# Patient Record
Sex: Male | Born: 1940 | Race: White | Hispanic: No | Marital: Married | Smoking: Former smoker
Health system: Southern US, Community
[De-identification: ages and names within clinical notes are randomized; demographics above are authoritative.]

## PROBLEM LIST (undated history)

## (undated) DIAGNOSIS — I2510 Atherosclerotic heart disease of native coronary artery without angina pectoris: Secondary | ICD-10-CM

## (undated) DIAGNOSIS — I10 Essential (primary) hypertension: Secondary | ICD-10-CM

## (undated) DIAGNOSIS — R972 Elevated prostate specific antigen [PSA]: Secondary | ICD-10-CM

## (undated) DIAGNOSIS — C61 Malignant neoplasm of prostate: Secondary | ICD-10-CM

## (undated) DIAGNOSIS — Z951 Presence of aortocoronary bypass graft: Secondary | ICD-10-CM

## (undated) DIAGNOSIS — E785 Hyperlipidemia, unspecified: Secondary | ICD-10-CM

## (undated) HISTORY — PX: OTHER SURGICAL HISTORY: SHX169

## (undated) HISTORY — PX: PROSTATE BIOPSY: SHX241

---
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
## 2008-02-16 ENCOUNTER — Ambulatory Visit: Payer: Self-pay | Admitting: Vascular Surgery

## 2008-04-25 ENCOUNTER — Encounter (INDEPENDENT_AMBULATORY_CARE_PROVIDER_SITE_OTHER): Payer: Self-pay | Admitting: Emergency Medicine

## 2008-04-25 ENCOUNTER — Inpatient Hospital Stay (HOSPITAL_COMMUNITY): Admission: EM | Admit: 2008-04-25 | Discharge: 2008-04-29 | Payer: Self-pay | Admitting: Emergency Medicine

## 2008-04-25 ENCOUNTER — Ambulatory Visit: Payer: Self-pay | Admitting: Vascular Surgery

## 2008-07-12 ENCOUNTER — Inpatient Hospital Stay (HOSPITAL_COMMUNITY): Admission: RE | Admit: 2008-07-12 | Discharge: 2008-07-12 | Payer: Self-pay | Admitting: Neurosurgery

## 2008-12-13 ENCOUNTER — Ambulatory Visit: Payer: Self-pay | Admitting: Vascular Surgery

## 2009-01-10 ENCOUNTER — Ambulatory Visit: Payer: Self-pay | Admitting: Vascular Surgery

## 2009-01-17 ENCOUNTER — Ambulatory Visit: Payer: Self-pay | Admitting: Vascular Surgery

## 2010-04-20 ENCOUNTER — Ambulatory Visit (INDEPENDENT_AMBULATORY_CARE_PROVIDER_SITE_OTHER): Payer: Federal, State, Local not specified - PPO

## 2010-04-20 DIAGNOSIS — I83893 Varicose veins of bilateral lower extremities with other complications: Secondary | ICD-10-CM

## 2010-06-13 LAB — CBC
HCT: 41.8 % (ref 39.0–52.0)
Hemoglobin: 14.4 g/dL (ref 13.0–17.0)
MCV: 93.1 fL (ref 78.0–100.0)
RBC: 4.49 MIL/uL (ref 4.22–5.81)
WBC: 6.7 10*3/uL (ref 4.0–10.5)

## 2010-06-13 LAB — BASIC METABOLIC PANEL
Chloride: 106 mEq/L (ref 96–112)
GFR calc Af Amer: >60 mL/min (ref >60)
Potassium: 5.1 mEq/L (ref 3.5–5.1)
Sodium: 143 mEq/L (ref 135–145)

## 2010-06-20 LAB — CBC
HCT: 44.2 % (ref 39.0–52.0)
Hemoglobin: 14.9 g/dL (ref 13.0–17.0)
Hemoglobin: 15.6 g/dL (ref 13.0–17.0)
MCHC: 35.3 g/dL (ref 30.0–36.0)
MCV: 93.1 fL (ref 78.0–100.0)
RBC: 4.53 MIL/uL (ref 4.22–5.81)
RDW: 12.2 % (ref 11.5–15.5)

## 2010-06-20 LAB — DIFFERENTIAL
Basophils Absolute: 0 10*3/uL (ref 0.0–0.1)
Basophils Relative: 0 % (ref 0–1)
Eosinophils Absolute: 0 10*3/uL (ref 0.0–0.7)
Eosinophils Relative: 1 % (ref 0–5)
Lymphs Abs: 0.6 10*3/uL — ABNORMAL LOW (ref 0.7–4.0)
Monocytes Absolute: 0.7 10*3/uL (ref 0.1–1.0)
Monocytes Relative: 11 % (ref 3–12)
Neutro Abs: 4.9 10*3/uL (ref 1.7–7.7)
Neutrophils Relative %: 89 % — ABNORMAL HIGH (ref 43–77)

## 2010-06-20 LAB — URINALYSIS, ROUTINE W REFLEX MICROSCOPIC
Bilirubin Urine: NEGATIVE
Glucose, UA: NEGATIVE mg/dL
Hgb urine dipstick: NEGATIVE
Ketones, ur: >80 mg/dL — AB
pH: 6.5 (ref 5.0–8.0)

## 2010-06-20 LAB — COMPREHENSIVE METABOLIC PANEL
ALT: 32 U/L (ref 0–53)
Alkaline Phosphatase: 72 U/L (ref 39–117)
CO2: 26 mEq/L (ref 19–32)
Calcium: 9.2 mg/dL (ref 8.4–10.5)
GFR calc non Af Amer: >60 mL/min (ref >60)
Glucose, Bld: 124 mg/dL — ABNORMAL HIGH (ref 70–99)
Sodium: 140 mEq/L (ref 135–145)

## 2010-06-20 LAB — ANA: Anti Nuclear Antibody(ANA): NEGATIVE

## 2010-06-20 LAB — PROTIME-INR: Prothrombin Time: 13.6 seconds (ref 11.6–15.2)

## 2010-06-20 LAB — ABO/RH: ABO/RH(D): A POS

## 2010-06-20 LAB — BASIC METABOLIC PANEL
BUN: 12 mg/dL (ref 6–23)
CO2: 30 mEq/L (ref 19–32)
Calcium: 9.2 mg/dL (ref 8.4–10.5)
Creatinine, Ser: 1.05 mg/dL (ref 0.4–1.5)
Glucose, Bld: 166 mg/dL — ABNORMAL HIGH (ref 70–99)

## 2010-06-20 LAB — APTT: aPTT: 34 seconds (ref 24–37)

## 2010-06-20 LAB — SEDIMENTATION RATE: Sed Rate: 5 mm/hr (ref 0–16)

## 2010-07-18 NOTE — Op Note (Signed)
NAME:  Zachary [name], Zachary [name]                  ACCOUNT NO.:  [id number]   MEDICAL RECORD NO.:  [id number]          PATIENT TYPE:  OBV   LOCATION:  5010                         FACILITY:  [facility]   PHYSICIAN:  Donalee Citrin, M.D.        DATE OF BIRTH:  05/14/1940   DATE OF PROCEDURE:  DATE OF DISCHARGE:                               OPERATIVE REPORT   PREOPERATIVE DIAGNOSIS:  Left L4 radiculopathy from ruptured disk  extraforaminally at L4-5, left.   PROCEDURE:  Extraforaminal approach to L4-5 diskectomy on the left side  with microscopic dissection of the left L4 nerve root and microscopic  diskectomy.   SURGEON:  Donalee Citrin, MD   ASSISTANT:  Dr. Jordan Likes.   ANESTHESIA:  General endotracheal.   HISTORY:  The patient is a very pleasant 70 year old gentleman who was  admitted to the hospital from the emergency room about 3 days ago in  acute pain with a left L4 radiculopathy.  The patient had weakness on  his quadriceps and a decreased knee jerk reflex on exam.  MRI scan  showed a disk herniation at the extraforaminal space at L4-L5 on the  left.  The patient failed all forms of treatment due to his weakness and  MRI findings patient is recommended extraforaminal diskectomy. The risks  and benefits of the operation were explained to the patient.  He  understood and agreed to proceed forth.   DESCRIPTION OF PROCEDURE:  The patient was brought to the OR, was  induced general anesthesia, positioned supine.  Back prepped and draped  in usual sterile fashion.  Preop x-ray localized at the appropriate  level, and after infiltration of 10 mL of lidocaine with epi, a midline  incision was made, and Bovie electrocautery was used to take down the  subperiosteum.  Dissection was carried out on the lamina of L4 and L5.  L4 TP was then identified intraoperatively with x-ray.  Then the  inferior aspect of the facet complex, lateral pars, and the superior  aspect of the inferior facet complex was drilled down.   Then a 4  Penfield was used to develop the plane underneath the lateral pars  overlying the intertransverse ligament.  This was bitten away with 2-mm  Kerrison punch.  The intertransverse ligament was removed in a piecemeal  fashion and the L4 nerve root was immediately identified, work extending  underneath the L4 nerve root and to the level of disk space, this was  further dissected free and identified.  The disk space was bulging but  no frank herniation was visualized.  Epidural veins were coagulated.  Disk space was incised and cleaned out with pituitary rongeurs.  The  foramen appeared to be patent on examination above the nerve root.  However, a very large fragment disk was pinned superior to the nerve  root against the pedicle.  This was teased away with a nerve hook and  removed with pituitary rongeurs.  Then the nerve root relaxed to resume  its normal anatomic location and was explored with a coronary dilator  and angled hockey stick  and noted to  have no further stenosis.  At this  point, a meticulous hemostasis was maintained.  Gelfoam was laid on top  of the nerve root and the  wound was closed in layers with interrupted Vicryl and skin was closed  with running 4-0 subcuticular.  Benzoin and Steri-Strips were applied.  The patient went to the recovery room in stable condition.  At the end  of the case, sponge and instrument count were correct.           ______________________________  Donalee Citrin, M.D.     GC/MEDQ  D:  04/28/2008  T:  04/29/2008  Job:  [id number]

## 2010-07-18 NOTE — Op Note (Signed)
NAME:  Zachary [name], Zachary [name]                  ACCOUNT NO.:  [id number]   MEDICAL RECORD NO.:  [id number]          PATIENT TYPE:  INP   LOCATION:  3534                         FACILITY:  [facility]   PHYSICIAN:  Donalee Citrin, M.D.        DATE OF BIRTH:  [birth year]   DATE OF PROCEDURE:  07/12/2008  DATE OF DISCHARGE:  07/12/2008                               OPERATIVE REPORT   PREOPERATIVE DIAGNOSES:  1. Cervical spondylosis with radiculopathy, C5-C6.  2. Spondylosis with stenosis and foraminal stenosis in C4-5 and C5-6.   PROCEDURE:  Anterior cervical diskectomies and fusion in C4-5, C5-6  using a 7-mm allograft wedge at C4-5 and 6 mm at C5-6 and a 40-mm  venture plate with 161 MHz screw.   SURGEON:  Donalee Citrin, MD   ASSISTANT:  Kathaleen Maser. Pool, MD   ANESTHESIA:  General endotracheal.   HISTORY OF PRESENT ILLNESS:  The patient is a very pleasant 70 year old  gentleman who has had several months of progressed worsening neck and  bilateral arm pain radiating into his right shoulder and down his left  arm to his thumb and forefinger with numbness and tingling in the same  distribution.  The patient had trace weakness in his triceps on the left  side and had severe pain that was refractory to all forms of  conservative  treatment, anti-inflammatories, steroids, and physical  therapy.  The patient's MRI scan showed marked spondylosis at C4-5 and  C5-6 causing severe foraminal stenosis predominantly on the right C5  nerve root and left C6 nerve root and due to the patient's failure to  conservative treatment, MRI findings, and clinical exam, the patient was  recommended anterior cervical diskectomy and fusion.  The risks and  benefits of the operation were explained to the patient.  He understood  and agreed to proceed forward.   The patient was brought to the OR, induced under general anesthesia,  positioned supine and the neck flexed in extension with 5 pounds of  halter traction.  The right side of  his neck was prepped and draped in  usual sterile fashion.  Preop x-ray localized the appropriate level.  A  curvilinear incision was made just off the midline to the anterior  border of the sternocleidomastoid.  The superficial layer of the  platysma was dissected out and divided longitudinally.  The avascular  plane between the sternocleidomastoid and strap muscles was developed  down to the prevertebral fascia.  Prevertebral fascia was then dissected  with Kittners.  Intraoperative x-ray confirmed localization at the C4-5  disk space, then anterior osteophytes were bitten off with a 2-mm  Kerrison punch at C4 and C5 respectively.  Disk spaces were drilled down  the posterior annulus osteophyte complexes.  At this point, the  operative microscope was draped, brought into the field and under  microscopic illumination first working at C4-5.  The interspace was  drilled down further posterior to identify the PLL.  This was done.  Both endplates were underbitten with a 1-mm Kerrison punch.  The PLL was  then removed in  piecemeal fashion exposing the thecal sac and the  central canal was decompressed by aggressive under biting both endplates  marching across first the left C5 pedicle and the left C5 nerve root was  decompressed and flushed with pedicle and then worked across the right.  On the right side, there was marked osteophyte displacing proximal at  right C5 nerve root, this was then aggressively underbitten  decompressing the right C5 nerve root.  At the end of the diskectomy,  there was no further stenosis on either C5 or neural foramen and central  canal was also widely decompressed.  This was packed with Gelfoam, and  attention was taken to C5-6, and then working at C5-6 in similar  fashion, aggressive underbiting of both C5 and C6 vertebral bodies was  carried out.  Decompressing the central canal, dura was removed in  piecemeal fashion, marching across lateral to both C6  pedicles, and both  C6 neural foramina were decompressed and flush with pedicle.  They again  assumed to be marked uncinate hypertrophy causing some foraminal  stenosis.  This was all unroofed and both C6 nerve roots were  decompressed easily accepting a nerve hook in the foramen.  Then, after  confirmation a central decompression with underbiting of both endplates,  the endplates were scraped.  A 6-mm allograft wedge inserted at C5-6 and  a 7-mm allograft wedge at C5, then a 40-mm Venture plate was placed.  All screws had excellent purchase.  Locking mechanism engaged.  Postop  fluoroscopy confirmed good position of plate, screws, and bone grafts.  Then after meticulous hemostasis was maintained, and was copiously  irrigated and closed in layers with interrupted Vicryl in the platysma  and a running 4-0 subcuticular.  Benzoin and Steri-Strips were applied.  The patient went to recovery room in stable condition.  At the end of  the case, sponge and instrument counts were correct.           ______________________________  Donalee Citrin, M.D.     GC/MEDQ  D:  07/12/2008  T:  07/13/2008  Job:  [id number]

## 2010-07-18 NOTE — Consult Note (Signed)
NAME:  Zachary [name], Zachary [name]                  ACCOUNT NO.:  [id number]   MEDICAL RECORD NO.:  [id number]          PATIENT TYPE:  OBV   LOCATION:  5010                         FACILITY:  [facility]   PHYSICIAN:  Donalee Citrin, M.D.        DATE OF BIRTH:  1940-11-09   DATE OF CONSULTATION:  04/27/2008  DATE OF DISCHARGE:                                 CONSULTATION   REASON FOR CONSULTATION:  Left leg pain.   CONSULTING PHYSICIAN:  Candyce Churn, MD   HISTORY OF PRESENT ILLNESS:  The patient is a very pleasant 70 year old  gentleman, who experienced acute onset of left-sided low back and left  leg pain started on Sunday.  Pain radiates through his buttock down the  posterior and lateral aspect of his left thigh and to his calf in front  of his shin.  This has been refractory to all forms of conservative  treatment with anti-inflammatories and medication at home.  The patient  presented to his medical doctor on 21st, and was admitted as the pain  was unable to get to.  He presented to the emergency room with 10/10  pain and was admitted from the emergency room to his medical doctor.  Since he has been in the hospital, he has been placed on Toradol and  anti-inflammatories in addition to IV Dilaudid and Ativan, and now he  has gotten the pain under control to where he can tolerated it, however,  without the medication, the pain is severe.  He also has noticed  weakness in the left leg that gives way whenever he ambulates.   PAST MEDICAL HISTORY:  Remarkable for hypertension and arthritis.   MEDICATIONS:  Lisinopril, Vicodin, aspirin, doxazosin, diclofenac, and  hydrochlorothiazide.   He has no medication allergies.  He denies any alcohol or tobacco use.   PHYSICAL EXAMINATION:  GENERAL:  The patient is a very pleasant, awake,  alert 70 year old gentleman, in no acute distress.  HEENT:  Within normal limits.  NECK:  Full range of motion with minimal discomfort.  NEUROLOGIC:  Strength is 5/5 in  his deltoids, biceps, triceps, wrist  flexion, and intrinsics.  Lower extremity strength 5/5 in his iliopsoas  bilaterally.  He has left-sided quadricep weakness at about 4/5 as well  as some hamstring weakness at 4+/5, dorsiflexion, plantar flexion, EHL  appear to be 5/5.  He has a decreased left knee jerk reflex compared to  the right and absent ankle jerks bilaterally.   His MRI scan does show a fairly sizeable foraminal disk herniation at L4-  5 on the left displacing the left L4 nerve root.  This clearly  correlates with his clinical syndrome.  Due to his failure of anti-  inflammatory treatment, the weakness he has in his left leg and  worsening pain and location of the MRI findings, I have recommended  extraforaminal diskectomy at L4-5 on the left.  I went over the risks  and benefits of operation with him, he wants to proceed forward.  We  tentatively schedule this for tomorrow, on Wednesday.  In addition,  the  patient did complain also of some numbness and tingling in his hands.  This is a completely different problem; however, he noticed back early  in November, he was started on Azor and he started developing cramping,  numbness, tingling, and stiffness in his hands.  In January, he was  switched to lisinopril, this has gotten some better, although he was  also started on hydrocodone and Toradol at the same time so that may  have contributed to some of the improvement.  He does have some  radiation going down the left arm, but denies any right arm radicular  symptoms.  His exam is unremarkable for upper extremity weakness or  reflex abnormalities, but he is already scheduled for cervical spine  MRI, and I will see what that shows and it is possible this could still  be a medication effect.  We will see what the MRI looks like, it  certainly could be radiculopathy or an early sign of some hand intrinsic  stiffness from spinal cord compression, although I think that is   unlikely, but it would be important to see those images before we put  him to see if it was his lumbar spine and we will follow that up and  treat that as an outpatient once we cure his acute L4 radiculopathy.           ______________________________  Donalee Citrin, M.D.     GC/MEDQ  D:  04/27/2008  T:  04/28/2008  Job:  [id number]

## 2010-07-18 NOTE — Consult Note (Signed)
VASCULAR SURGERY CONSULTATION   Hinde, Jahziel  DOB:  [birth year]                                       02/16/2008  CHART#:[id number]   The patient is a year 70 year old gentleman referred by Dr. Kevan Ny for  venous insufficiency left worse than right.  This gentleman has been  having increasing discomfort in the left thigh and calf, especially  after having been on his feet.  He develops a burning, itching,  throbbing discomfort which is partially relieved by elevation.  He has  not worn elastic compression stockings and does not take pain medicine  on a regular basis.  He has some mild swelling which develops as the day  progresses.  He has had no history of thrombophlebitis, deep venous  thrombosis, bleeding or stasis ulcers.  His primary symptoms involve the  left thigh and calf area with no symptomatology in the right leg at this  time.   PAST MEDICAL HISTORY:  1. Hypertension.  2. Negative for diabetes, coronary artery disease, COPD, stroke or      hyperlipidemia.   PREVIOUS SURGERY:  1. Nasal mass resection age 8.  2. Bone graft __________to repair his nose.  3. LASIK surgery.  4. Right inguinal hernia.  5. Also a history of sigmoid diverticulosis and occasional sciatica.   ALLERGIES:  None known.   MEDICATIONS:  Please see health history form.   FAMILY HISTORY:  Positive for coronary artery disease in his mother,  father and a brother.  Negative for diabetes and stroke.   SOCIAL HISTORY:  He is married, he has 2 children, does not use tobacco,  drinks occasional alcohol.   REVIEW OF SYSTEMS:  Unremarkable.  No chest pain, dyspnea on exertion,  PND, orthopnea, bronchitis, hemoptysis.  No GI or GU symptoms at this  time.   PHYSICAL EXAMINATION:  Blood pressure 138/70, heart rate 60,  respirations 14.  General:  This is a healthy-appearing male in no  apparent distress, alert and oriented x3.  Neck:  Supple, 3+ carotid  pulses palpable.  No bruits  are audible.  Neurologic:  Normal.  No  palpable adenopathy in the neck.  Chest:  Clear to auscultation.  Cardiovascular:  Regular rhythm, no murmurs.  Abdomen:  Soft, nontender  with no masses.  He has 3+ femoral, popliteal, dorsalis pedis and  posterior tibial pulses bilaterally.  The left leg has prominent  varicosities along the greater saphenous system beginning in the mid  thigh, extending into the distal thigh and into the medial calf.  There  is also some spider veins emanating from this.  Right leg has spider  veins in the anterior medial thigh area in 2 patterns.  He has no distal  edema on the right but does have 1+ edema on the left.  No  hyperpigmentation or ulceration is noted.   Venous duplex exam today reveals gross reflux at the left saphenofemoral  junction and throughout the left great saphenous vein to the knee.  The  deep system has only mild reflux on the left.  Right leg is  unremarkable.   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  This  is affecting his daily living and we will treat him initially with  elastic compression stockings (20  mm to 30 mm long-leg) as well as  elevation of his legs, as much as his work will permit, and ibuprofen on  a regular basis.  I will see him back in 3 months to check for any  improvement in his symptomatology.  If he is not improved, I think he  would be a good candidate for laser ablation of his left great saphenous  vein with multiple stab phlebectomies.   Quita Skye Hart [place], M.D.  Electronically Signed  JDL/MEDQ  D:  02/16/2008  T:  02/17/2008  Job:  [id number]   cc:   Candyce Churn, M.D.

## 2010-07-18 NOTE — Procedures (Signed)
LOWER EXTREMITY VENOUS REFLUX EXAM   INDICATION:  Varicose veins.   EXAM:  Using color-flow imaging and pulse Doppler spectral analysis, the  bilateral common femoral, superficial femoral, popliteal, posterior  tibial, greater and lesser saphenous veins are evaluated.  There is  evidence suggesting deep venous insufficiency in the bilateral common  femoral and left popliteal veins.   The right saphenofemoral junction is competent.  The left saphenofemoral  junction is not competent.  The right GSV is competent. The left GSV is  not competent with the caliber as described below.   The right proximal short saphenous vein was not visualized.  The left  proximal short saphenous vein demonstrates competency.   GSV Diameter (used if found to be incompetent only)                                            Right    Left  Proximal Greater Saphenous Vein           cm       0.58 cm  Proximal-to-mid-thigh                     cm       0.53 cm  Mid thigh                                 cm       0.58 cm  Mid-distal thigh                          cm       0.49 cm  Distal thigh                              cm       0.49 cm  Knee                                      cm       0.41 cm   IMPRESSION:  1. Left greater saphenous vein reflux is identified with the caliber      measurements as described above.  2. The bilateral greater saphenous veins are not aneurysmal or      tortuous.  3. The deep venous system demonstrates incompetency, as described      above.   ___________________________________________  Quita Skye. Hart [place], M.D.   CH/MEDQ  D:  02/16/2008  T:  02/17/2008  Job:  [id number]

## 2010-07-18 NOTE — H&P (Signed)
NAME:  AKING, KLABUNDE                  ACCOUNT NO.:  [id number]   MEDICAL RECORD NO.:  [id number]          PATIENT TYPE:  EMS   LOCATION:  MAJO                         FACILITY:  [facility]   PHYSICIAN:  Hollice Espy, M.D.DATE OF BIRTH:  10-10-1940   DATE OF ADMISSION:  04/25/2008  DATE OF DISCHARGE:                              HISTORY & PHYSICAL   PRIMARY CARE Daylan Juhnke:  Dr. Johnella Moloney.   CHIEF COMPLAINT:  Leg pain.   HISTORY OF PRESENT ILLNESS:  The patient is a 70 year old white male  with a past medical history of hypertension who had been on an ACE  inhibitor and had been having some problems with hand pain.  He has been  alternating his care with Dr. Kevan Ny as well as with the [facility] and  his lisinopril had been changed over to Azor, which is a combination of  Norvasc and Benicar.  He started noticing problems with hand swelling  and pain in his hands and arms, and switched himself back to lisinopril,  and he said that helped his symptoms.  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  His MRI of  the C-spine was not yet done.  He woke up this early morning hours in  severe leg pain, mostly anterior but some posterior radiating all way  down past his knee.  Pain is described as severe 10/10 when he presented  to the emergency room.  X-rays of the left hip showed no acute  abnormality and the left knee noted minimal atheromatous arterial  calcifications but no acute abnormality.  Because of the pain that was  difficult to control with p.o. medications and even required some IV  pain medication, which started making the pain somewhat tolerable, it  was felt best that he come in for further evaluation and treatment.  Did  get CBC, BMET, and CPK level. White count came back elevated at 10.  However, with a noted 80% shift.  The rest of his CBC was  unremarkable.  His CMET and CK are currently pending.  Currently, the patient is doing  well.  He denies any headaches, vision changes, dysphagia, chest pain,  palpitations, shortness breath, wheeze, cough, abdominal pain,  hematuria, dysuria, vision, diarrhea focal extremity numbness weakness  or pain other than described above.   REVIEW OF SYSTEMS:  Otherwise negative.   PAST MEDICAL HISTORY:  Includes hypertension and some arthritis.   MEDICATIONS:  1. He is on Azor, which he has quit taking and restarted back on      lisinopril 40.  2. He takes p.r.n. Vicodin.  3. Aspirin 81.  4. Doxazosin 1 mg p.o. daily.  5. Diclofenac p.o. b.i.d.  6. Hydrochlorothiazide 25 p.o. daily.   ALLERGIES:  He has no known drug allergies.   SOCIAL HISTORY:  Denies any tobacco, alcohol or drug use.   FAMILY HISTORY:  Noncontributory.   PHYSICAL EXAMINATION:  VITALS ON ADMISSION:  Temperature  96.4, heart  rate 65, blood pressure 151/89 down to 146/80, respirations 18, O2  saturation 100% on room air.  GENERAL:  He is alert and oriented x3 in no apparent distress.  HEENT:  Normocephalic atraumatic.  Mucous membranes are moist.  He has  no carotid bruits.  HEART:  Regular rate and rhythm.  S1, S2.  LUNGS:  Clear to auscultation bilaterally.  ABDOMEN:  Soft, nontender, nondistended.  Positive bowel sounds.  EXTREMITIES:  No clubbing, cyanosis or edema.  Musculoskeletal exam for  that leg includes no increased pain with deep palpation of his anterior  thigh and able to flex and extend and seemed to have some improvement  when he contracts his hip and flexes his knee, but it does not resolve  the pain at all.  He has some straight leg raising and obturator's are  negative for pain.  He has some positive increased pain, he says, with  Homan flexion, but otherwise unremarkable.   LAB WORK:  White count 10, H and H 15.6 and 44, MCV 93, platelet count  300,000, 89% shift.  Rest of his labs are pending.   X-rays are as per  HPI.   ASSESSMENT AND PLAN:  1. Leg pain.  Question if this is rhabdomyolysis.  Will await CPK      level.  In the meantime will give pain control as well as some      anxiety control.  Will also check an MRI of the lumbar spine.  If      this is negative, will check a CT angio of the legs and look for      possible vascular insufficiency.  2. Hypertension.  Continue medications.      Hollice Espy, M.D.  Electronically Signed     SKK/MEDQ  D:  04/25/2008  T:  04/25/2008  Job:  [id number]   cc:   Candyce Churn, M.D.

## 2010-07-18 NOTE — Assessment & Plan Note (Signed)
OFFICE VISIT   Duren, Wasim L  DOB:  1941-02-11                                       01/17/2009  CHART#:[id number]   The patient returns 1 week post laser ablation of left great saphenous  vein with multiple stab phlebectomies for painful varicosities and  swelling.  He has done well with some mild discomfort along the course  of the great saphenous vein in the thigh.  He only took small doses of  ibuprofen rather than the larger doses, but states that the pain has not  been that severe.  He has had no pain at the stab phlebectomy wounds,  has had no distal edema and has been wearing his elastic compression  stocking as requested.  On exam today he has no distal edema.  Incisions  are all healing nicely.  There is mild tenderness along the course of  the great saphenous vein in the left thigh.  Venous duplex exam reveals  no evidence of deep venous obstruction and the left great saphenous vein  appears to be totally occluded with no reflux at the junction.   I reassured him regarding these findings.  He will wear the stocking for  one more week and return to see [place] on a p.r.n. basis.   Quita Skye Hart [place], M.D.  Electronically Signed   JDL/MEDQ  D:  01/17/2009  T:  01/18/2009  Job:  [id number]   cc:   Candyce Churn, M.D.

## 2010-07-18 NOTE — Assessment & Plan Note (Signed)
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  This 70 year old gentleman has continued  to have increasing discomfort in the thigh and calf after having been on  his feet and also while sitting.  He has been wearing long-leg graduated  compression stockings (20 mm - 30 mm) over the past 9 months and  elevating the leg as much as possible as well as trying ibuprofen on a  regular basis.  He has had a cervical spine and lumbar spine surgery  since we last saw him in December 2009.  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  On examination he  continues to have bulging varicosities in the distal left thigh over the  great saphenous vein as well as in the medial left calf over the left  great saphenous vein with some mild distal edema.   I think the best plan to treat these symptoms would be laser ablation of  the left great saphenous vein with multiple stab phlebectomies.  These  are affecting his daily living and we will plan to get this precertified  to proceed in the near future for this nice man.   Quita Skye Hart [place], M.D.  Electronically Signed   JDL/MEDQ  D:  12/13/2008  T:  12/14/2008  Job:  [id number]

## 2010-07-18 NOTE — Procedures (Signed)
DUPLEX DEEP VENOUS EXAM - LOWER EXTREMITY   INDICATION:  Follow up laser ablation.   HISTORY:  Edema:  No.  Trauma/Surgery:  Left great saphenous vein laser ablation on 01/10/09.  Pain:  Left thigh tenderness.  PE:  No.  Previous DVT:  No.  Anticoagulants:  Other:   DUPLEX EXAM:                CFV   SFV   PopV  PTV    GSV                R  L  R  L  R  L  R   L  R  L  Thrombosis    o  o     o     o      o     +  Spontaneous   +  +     +     +      +     0  Phasic        +  +     +     +      +     0  Augmentation  +  +     +     +      +     0  Compressible  +  +     +     +      +     0  Competent     +  +     +     +      +     +   Legend:  + - yes  o - no  p - partial  D - decreased   IMPRESSION:  1. No evidence of left lower extremity deep venous thrombosis noted.  2. 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  3. No reflux as noted in the left saphenofemoral junction.      _____________________________  Quita Skye Hart [place], M.D.   CH/MEDQ  D:  01/18/2009  T:  01/18/2009  Job:  [id number]

## 2010-07-21 NOTE — Discharge Summary (Signed)
NAME:  Zachary [name], Zachary [name]                  ACCOUNT NO.:  [id number]   MEDICAL RECORD NO.:  [id number]          PATIENT TYPE:  INP   LOCATION:  5010                         FACILITY:  [facility]   PHYSICIAN:  Candyce Churn, M.D.DATE OF BIRTH:  13-Aug-1940   DATE OF ADMISSION:  04/25/2008  DATE OF DISCHARGE:  04/29/2008                               DISCHARGE SUMMARY   DISCHARGE DIAGNOSES:  1. 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  2. Hypertension.  3. History of sigmoid diverticulosis.  4. Constipation secondary to narcotic analgesics.   PROCEDURE:  Extraforaminal approach to L4-5 diskectomy on the left with  microscopic dissection of the left L4 nerve root and microscopic  diskectomy - Dr. Donalee Citrin.   DISCHARGE MEDICATIONS:  1. Lisinopril 40 mg orally daily.  2. Amlodipine 5 mg daily.  3. Hydrochlorothiazide 25 mg daily.  4. MiraLax powder 17 g in 8 ounces of water daily as needed for      constipation.  5. Vicodin 5/500, 1-2 orally q.4-6 h. p.r.n. pain.   HOSPITAL COURSE:  Zachary [name] is a pleasant 70 year old male with a  past history of hypertension.  He has a history of both cervical and  lumbar radiculopathy.  He woke up early on the morning of April 25, 2008, was having severe leg pain on the left radiating down past his  left knee.  The pain was described as very severe, 10/10, when he  presented to the emergency room, and x-rays of the left hip showed no  acute abnormality and only minimal degenerative changes of the left  knee.  He was started on intravenous pain medication and was admitted  for further pain control and workup.   MRI of the lumbar spine performed on April 26, 2008 revealed a  foraminal disk protrusion on the left, and there was a prominent  leftward epidural vein behind L4 versus a small epidural hematoma.  Left  L4 nerve root encroachment was noted.   The patient was seen in consultation by Dr. Donalee Citrin who proceeded with  surgery as above on April 28, 2008 with excellent relief of pain over  the next 24 hours.  The patient was able to be discharged home in much  improved condition.   Blood pressure, though elevated on admission, was controlled by  discharge.   DISCHARGE LABORATORY DATA:  From April 27, 2008, CBC revealed a white  count 6700, hemoglobin was 14.9, platelet count was 295,000.  PTT was 34  seconds.  Protime was 13.6 seconds.  Sodium 138, potassium 4, chloride  102, bicarb 30, glucose mildly elevated at 166, and BUN 12 and  creatinine 1.05.  ANA from April 26, 2008 was negative and sed rate  was only 5 mm per hour and rheumatoid factor was less than 20, normal.  TSH on April 26, 2008 was normal at 0.875.  urinalysis on admission  was within normal limits except for greater than 80 mg of ketones per  deciliter secondary to poor p.o. intake on admission.  Liver function  tests on admission were within normal limits except for a slightly AST  of 39.  Creatinine kinase on admission was 230, normal.   CONDITION ON DISCHARGE:  Much improved.  The patient will follow up in  my office in 1 week and with Dr. Wynetta Emery in 1-2 weeks.   The patient did have an MRI of the orbits because of prior history of  metal exposure to the orbits, and there were no metallic foreign bodies  within the orbits.  X-ray of the knee performed on April 25, 2008  revealed minimal arterial calcifications with normal-appearing bones and  soft tissues.  Left hip x-ray on April 25, 2008 revealed left pelvic  phleboliths; otherwise within normal limits.      Candyce Churn, M.D.  Electronically Signed     RNG/MEDQ  D:  05/16/2008  T:  05/17/2008  Job:  [id number]   cc:   Donalee Citrin, M.D.

## 2014-06-21 ENCOUNTER — Other Ambulatory Visit: Payer: Self-pay | Admitting: Orthopaedic Surgery

## 2014-06-23 NOTE — H&P (Signed)
Zachary [name] is an 74 y.o. male.    Chief Complaint: Left knee pain  HPI: Zachary [name] is a 74 year old man retired from the [employer]. He is here again about his left knee. He's undergone an MRI scan since last time he was here. He has intense pain on the inside aspect. He has trouble resting at night especially. He has trouble sitting comfortably and twisting is painful. This pain is intermittent and severe and is getting worse. He did receive an injection a few weeks back which helped transiently.   MRI:  I reviewed an MRI scan films and report of a study done at the [facility] on 06/15/14. This shows an oblique tear of the medial meniscus.  No past medical history on file.  No past surgical history on file.  No family history on file. Social History:  has no tobacco, alcohol, and drug history on file.  Allergies: Allergies not on file  No prescriptions prior to admission    No results found for this or any previous visit (from the past 48 hour(s)). No results found.  Review of Systems  Musculoskeletal: Positive for joint pain.       Left knee pain  All other systems reviewed and are negative.   There were no vitals taken for this visit. Physical Exam  Constitutional: He is oriented to person, place, and time. He appears well-developed and well-nourished.  HENT:  Head: Normocephalic and atraumatic.  Eyes: Pupils are equal, round, and reactive to light.  Neck: Normal range of motion.  Cardiovascular: Normal rate and regular rhythm.   Respiratory: Effort normal.  GI: Soft.  Musculoskeletal:  Left knee has no effusion. His pain is posteromedial and McMurray's test which is positive for pain. He has good ligamentous integrity.  Hip motion is full and pain free and SLR is negative on both sides.  There is no palpable LAD behind either knee.  Sensation and motor function are intact on both sides and there are palpable pulses on both sides.  Neurological: He is alert and oriented to  person, place, and time.  Skin: Skin is warm and dry.  Psychiatric: He has a normal mood and affect. His behavior is normal. Judgment and thought content normal.     Assessment/Plan Assessment: Left knee torn medial meniscus by MRI  Plan: At this point I think Zachary [name] needs to consider a knee arthroscopy. He has terrible pain despite some time and an injection. I reviewed risks of anesthesia and infection as well as potential for DVT related to a knee arthroscopy.  I've stressed the importance of some postoperative physical therapy to optimize results and we will try to set up an appointment.  Two to four weeks for recovery would be typical but that is a little variable.    Zachary [name], Zachary [name] 06/23/2014, 12:28 PM

## 2014-06-24 ENCOUNTER — Encounter (HOSPITAL_BASED_OUTPATIENT_CLINIC_OR_DEPARTMENT_OTHER): Payer: Self-pay | Admitting: *Deleted

## 2014-06-24 ENCOUNTER — Encounter (HOSPITAL_BASED_OUTPATIENT_CLINIC_OR_DEPARTMENT_OTHER)
Admission: RE | Admit: 2014-06-24 | Discharge: 2014-06-24 | Disposition: A | Discharge status: Home / Self Care | Payer: PPO | Source: Ambulatory Visit | Attending: Orthopaedic Surgery | Admitting: Orthopaedic Surgery

## 2014-06-24 ENCOUNTER — Other Ambulatory Visit: Payer: Self-pay

## 2014-06-24 DIAGNOSIS — S83242A Other tear of medial meniscus, current injury, left knee, initial encounter: Secondary | ICD-10-CM | POA: Diagnosis not present

## 2014-06-24 DIAGNOSIS — M23204 Derangement of unspecified medial meniscus due to old tear or injury, left knee: Secondary | ICD-10-CM | POA: Diagnosis not present

## 2014-06-24 DIAGNOSIS — Y929 Unspecified place or not applicable: Secondary | ICD-10-CM | POA: Diagnosis not present

## 2014-06-24 DIAGNOSIS — Z01818 Encounter for other preprocedural examination: Secondary | ICD-10-CM | POA: Insufficient documentation

## 2014-06-24 DIAGNOSIS — X58XXXA Exposure to other specified factors, initial encounter: Secondary | ICD-10-CM | POA: Diagnosis not present

## 2014-06-24 LAB — BASIC METABOLIC PANEL
ANION GAP: 7 (ref 5–15)
BUN: 17 mg/dL (ref 6–23)
CALCIUM: 9.2 mg/dL (ref 8.4–10.5)
CHLORIDE: 103 mmol/L (ref 96–112)
CO2: 29 mmol/L (ref 19–32)
Creatinine, Ser: 0.91 mg/dL (ref 0.50–1.35)
GFR calc Af Amer: >90 mL/min (ref >90)
GFR calc non Af Amer: 81 mL/min — ABNORMAL LOW (ref >90)
Glucose, Bld: 119 mg/dL — ABNORMAL HIGH (ref 70–99)
Potassium: 3.9 mmol/L (ref 3.5–5.1)
SODIUM: 139 mmol/L (ref 135–145)

## 2014-06-24 NOTE — Progress Notes (Signed)
Coming today for BMET and EKG. Bring all medications day of surgery.

## 2014-06-25 ENCOUNTER — Encounter (HOSPITAL_BASED_OUTPATIENT_CLINIC_OR_DEPARTMENT_OTHER): Payer: Self-pay | Admitting: Anesthesiology

## 2014-06-25 ENCOUNTER — Ambulatory Visit (HOSPITAL_BASED_OUTPATIENT_CLINIC_OR_DEPARTMENT_OTHER): Payer: PPO | Admitting: Anesthesiology

## 2014-06-25 ENCOUNTER — Ambulatory Visit (HOSPITAL_BASED_OUTPATIENT_CLINIC_OR_DEPARTMENT_OTHER)
Admission: RE | Admit: 2014-06-25 | Discharge: 2014-06-25 | Disposition: A | Discharge status: Home / Self Care | Payer: PPO | Source: Ambulatory Visit | Attending: Orthopaedic Surgery | Admitting: Orthopaedic Surgery

## 2014-06-25 ENCOUNTER — Encounter (HOSPITAL_BASED_OUTPATIENT_CLINIC_OR_DEPARTMENT_OTHER)
Admission: RE | Disposition: A | Discharge status: Home / Self Care | Payer: Self-pay | Source: Ambulatory Visit | Attending: Orthopaedic Surgery

## 2014-06-25 DIAGNOSIS — X58XXXA Exposure to other specified factors, initial encounter: Secondary | ICD-10-CM | POA: Insufficient documentation

## 2014-06-25 DIAGNOSIS — Y929 Unspecified place or not applicable: Secondary | ICD-10-CM | POA: Insufficient documentation

## 2014-06-25 DIAGNOSIS — S83242A Other tear of medial meniscus, current injury, left knee, initial encounter: Secondary | ICD-10-CM | POA: Diagnosis not present

## 2014-06-25 HISTORY — PX: KNEE ARTHROSCOPY WITH MEDIAL MENISECTOMY: SHX5651

## 2014-06-25 HISTORY — DX: Essential (primary) hypertension: I10

## 2014-06-25 HISTORY — PX: CHONDROPLASTY: SHX5177

## 2014-06-25 LAB — POCT HEMOGLOBIN-HEMACUE: HEMOGLOBIN: 15.9 g/dL (ref 13.0–17.0)

## 2014-06-25 SURGERY — ARTHROSCOPY, KNEE, WITH MEDIAL MENISCECTOMY
Anesthesia: General | Site: Knee | Laterality: Left

## 2014-06-25 MED ORDER — MIDAZOLAM HCL 2 MG/2ML IJ SOLN: 1.0000 mg | INTRAMUSCULAR | Status: DC | PRN

## 2014-06-25 MED ORDER — CEFAZOLIN SODIUM-DEXTROSE 2-3 GM-% IV SOLR
INTRAVENOUS | Status: DC | PRN
  Administered 2014-06-25: 2 g via INTRAVENOUS

## 2014-06-25 MED ORDER — IBUPROFEN 200 MG PO TABS: 200.0000 mg | ORAL_TABLET | Freq: Four times a day (QID) | ORAL | Status: DC | PRN

## 2014-06-25 MED ORDER — OXYCODONE HCL 5 MG/5ML PO SOLN: 5.0000 mg | Freq: Once | ORAL | Status: AC | PRN

## 2014-06-25 MED ORDER — SODIUM CHLORIDE 0.9 % IR SOLN
Status: DC | PRN
Start: 2014-06-25 — End: 2014-06-25
  Administered 2014-06-25: 6000 mL

## 2014-06-25 MED ORDER — GLYCOPYRROLATE 0.2 MG/ML IJ SOLN: 0.2000 mg | Freq: Once | INTRAMUSCULAR | Status: DC | PRN

## 2014-06-25 MED ORDER — PROPOFOL 10 MG/ML IV BOLUS
INTRAVENOUS | Status: DC | PRN
  Administered 2014-06-25: 200 mg via INTRAVENOUS

## 2014-06-25 MED ORDER — FENTANYL CITRATE (PF) 100 MCG/2ML IJ SOLN
INTRAMUSCULAR | Status: DC | PRN
  Administered 2014-06-25: 50 ug via INTRAVENOUS
  Administered 2014-06-25: 25 ug via INTRAVENOUS
  Administered 2014-06-25: 50 ug via INTRAVENOUS

## 2014-06-25 MED ORDER — HYDROMORPHONE HCL 1 MG/ML IJ SOLN: 0.2500 mg | INTRAMUSCULAR | Status: DC | PRN

## 2014-06-25 MED ORDER — LIDOCAINE HCL (CARDIAC) 20 MG/ML IV SOLN
INTRAVENOUS | Status: DC | PRN
  Administered 2014-06-25: 50 mg via INTRAVENOUS

## 2014-06-25 MED ORDER — BUPIVACAINE-EPINEPHRINE 0.5% -1:200000 IJ SOLN
INTRAMUSCULAR | Status: DC | PRN
  Administered 2014-06-25: 20 mL

## 2014-06-25 MED ORDER — FENTANYL CITRATE (PF) 100 MCG/2ML IJ SOLN
INTRAMUSCULAR | Status: AC
  Filled 2014-06-25: qty 6

## 2014-06-25 MED ORDER — ONDANSETRON HCL 4 MG/2ML IJ SOLN
INTRAMUSCULAR | Status: DC | PRN
  Administered 2014-06-25: 4 mg via INTRAVENOUS

## 2014-06-25 MED ORDER — LACTATED RINGERS IV SOLN
INTRAVENOUS | Status: DC
  Administered 2014-06-25 (×2): via INTRAVENOUS

## 2014-06-25 MED ORDER — OXYCODONE HCL 5 MG PO TABS
ORAL_TABLET | ORAL | Status: AC
Start: 2014-06-25 — End: 2014-06-25
  Filled 2014-06-25: qty 1

## 2014-06-25 MED ORDER — CHLORHEXIDINE GLUCONATE 4 % EX LIQD: 60.0000 mL | Freq: Once | CUTANEOUS | Status: DC

## 2014-06-25 MED ORDER — MEPERIDINE HCL 25 MG/ML IJ SOLN: 6.2500 mg | INTRAMUSCULAR | Status: DC | PRN

## 2014-06-25 MED ORDER — LACTATED RINGERS IV SOLN: INTRAVENOUS | Status: DC

## 2014-06-25 MED ORDER — MORPHINE SULFATE 4 MG/ML IJ SOLN
INTRAMUSCULAR | Status: DC | PRN
  Administered 2014-06-25: 4 mg via INTRAVENOUS

## 2014-06-25 MED ORDER — MIDAZOLAM HCL 5 MG/5ML IJ SOLN: INTRAMUSCULAR | Status: DC | PRN

## 2014-06-25 MED ORDER — OXYCODONE HCL 5 MG PO TABS
5.0000 mg | ORAL_TABLET | Freq: Once | ORAL | Status: AC | PRN
Start: 2014-06-25 — End: 2014-06-25
  Administered 2014-06-25: 5 mg via ORAL

## 2014-06-25 MED ORDER — FENTANYL CITRATE (PF) 100 MCG/2ML IJ SOLN: 50.0000 ug | INTRAMUSCULAR | Status: DC | PRN

## 2014-06-25 MED ORDER — DEXAMETHASONE SODIUM PHOSPHATE 4 MG/ML IJ SOLN
INTRAMUSCULAR | Status: DC | PRN
  Administered 2014-06-25: 10 mg via INTRAVENOUS

## 2014-06-25 MED ORDER — IBUPROFEN 100 MG/5ML PO SUSP: 200.0000 mg | Freq: Four times a day (QID) | ORAL | Status: DC | PRN

## 2014-06-25 MED ORDER — CEFAZOLIN SODIUM-DEXTROSE 2-3 GM-% IV SOLR: 2.0000 g | INTRAVENOUS | Status: DC

## 2014-06-25 MED ORDER — HYDROCODONE-ACETAMINOPHEN 5-325 MG PO TABS: 1.0000 | ORAL_TABLET | Freq: Four times a day (QID) | ORAL | Status: DC | PRN

## 2014-06-25 MED ORDER — KETOROLAC TROMETHAMINE 30 MG/ML IJ SOLN: 30.0000 mg | Freq: Once | INTRAMUSCULAR | Status: DC | PRN

## 2014-06-25 MED ORDER — CEFAZOLIN SODIUM-DEXTROSE 2-3 GM-% IV SOLR
INTRAVENOUS | Status: AC
  Filled 2014-06-25: qty 50

## 2014-06-25 MED ORDER — MORPHINE SULFATE 4 MG/ML IJ SOLN
INTRAMUSCULAR | Status: AC
  Filled 2014-06-25: qty 1

## 2014-06-25 SURGICAL SUPPLY — 38 items
BANDAGE ELASTIC 6 VELCRO ST LF (GAUZE/BANDAGES/DRESSINGS) ×4 IMPLANT
BLADE CUDA 5.5 (BLADE) IMPLANT
BLADE CUTTER GATOR 3.5 (BLADE) ×4 IMPLANT
BLADE GREAT WHITE 4.2 (BLADE) IMPLANT
BNDG GAUZE ELAST 4 BULKY (GAUZE/BANDAGES/DRESSINGS) ×4 IMPLANT
DRAPE ARTHROSCOPY W/POUCH 114 (DRAPES) ×4 IMPLANT
DRAPE U-SHAPE 47X51 STRL (DRAPES) ×4 IMPLANT
DRSG EMULSION OIL 3X3 NADH (GAUZE/BANDAGES/DRESSINGS) ×4 IMPLANT
DURAPREP 26ML APPLICATOR (WOUND CARE) ×4 IMPLANT
ELECT MENISCUS 165MM 90D (ELECTRODE) IMPLANT
ELECT REM PT RETURN 9FT ADLT (ELECTROSURGICAL)
ELECTRODE REM PT RTRN 9FT ADLT (ELECTROSURGICAL) IMPLANT
GAUZE SPONGE 4X4 12PLY STRL (GAUZE/BANDAGES/DRESSINGS) ×4 IMPLANT
GLOVE BIO SURGEON STRL SZ8 (GLOVE) ×8 IMPLANT
GLOVE BIOGEL PI IND STRL 7.0 (GLOVE) ×3 IMPLANT
GLOVE BIOGEL PI IND STRL 8 (GLOVE) ×6 IMPLANT
GLOVE BIOGEL PI INDICATOR 7.0 (GLOVE) ×1
GLOVE BIOGEL PI INDICATOR 8 (GLOVE) ×2
GLOVE ECLIPSE 6.5 STRL STRAW (GLOVE) ×4 IMPLANT
GOWN STRL REUS W/ TWL LRG LVL3 (GOWN DISPOSABLE) ×3 IMPLANT
GOWN STRL REUS W/ TWL XL LVL3 (GOWN DISPOSABLE) ×6 IMPLANT
GOWN STRL REUS W/TWL LRG LVL3 (GOWN DISPOSABLE) ×1
GOWN STRL REUS W/TWL XL LVL3 (GOWN DISPOSABLE) ×2
IV NS IRRIG 3000ML ARTHROMATIC (IV SOLUTION) IMPLANT
KNEE WRAP E Z 3 GEL PACK (MISCELLANEOUS) ×4 IMPLANT
MANIFOLD NEPTUNE II (INSTRUMENTS) IMPLANT
PACK ARTHROSCOPY DSU (CUSTOM PROCEDURE TRAY) ×4 IMPLANT
PACK BASIN DAY SURGERY FS (CUSTOM PROCEDURE TRAY) ×4 IMPLANT
PENCIL BUTTON HOLSTER BLD 10FT (ELECTRODE) IMPLANT
SET ARTHROSCOPY TUBING (MISCELLANEOUS) ×1
SET ARTHROSCOPY TUBING LN (MISCELLANEOUS) ×3 IMPLANT
SHEET MEDIUM DRAPE 40X70 STRL (DRAPES) ×4 IMPLANT
SYR 3ML 18GX1 1/2 (SYRINGE) IMPLANT
TOWEL OR 17X24 6PK STRL BLUE (TOWEL DISPOSABLE) ×4 IMPLANT
TOWEL OR NON WOVEN STRL DISP B (DISPOSABLE) ×4 IMPLANT
WAND 30 DEG SABER W/CORD (SURGICAL WAND) IMPLANT
WAND STAR VAC 90 (SURGICAL WAND) IMPLANT
WATER STERILE IRR 1000ML POUR (IV SOLUTION) ×4 IMPLANT

## 2014-06-25 NOTE — Op Note (Signed)
#  [id number] 

## 2014-06-25 NOTE — Anesthesia Postprocedure Evaluation (Signed)
  Anesthesia Post-op Note  Patient: Zachary [name]  Procedure(s) Performed: Procedure(s): LEFT ARTHROSCOPY KNEE (Left) CHONDROPLASTY  Patient Location: PACU  Anesthesia Type: General   Level of Consciousness: awake, alert  and oriented  Airway and Oxygen Therapy: Patient Spontanous Breathing  Post-op Pain: mild  Post-op Assessment: Post-op Vital signs reviewed  Post-op Vital Signs: Reviewed  Last Vitals:  Filed Vitals:   06/25/14 1629  BP: 166/78  Pulse: 71  Temp: 36.6 C  Resp: 20    Complications: No apparent anesthesia complications

## 2014-06-25 NOTE — Discharge Instructions (Addendum)

## 2014-06-25 NOTE — Anesthesia Preprocedure Evaluation (Signed)
Anesthesia Evaluation  Patient identified by MRN, date of birth, ID band Patient awake    Reviewed: Allergy & Precautions, NPO status , Patient's Chart, lab work & pertinent test results  Airway Mallampati: I  TM Distance: >3 FB Neck ROM: Full    Dental  (+) Teeth Intact, Dental Advisory Given   Pulmonary former smoker,  breath sounds clear to auscultation        Cardiovascular hypertension, Pt. on medications Rhythm:Regular Rate:Normal     Neuro/Psych    GI/Hepatic   Endo/Other    Renal/GU      Musculoskeletal   Abdominal   Peds  Hematology   Anesthesia Other Findings   Reproductive/Obstetrics                             Anesthesia Physical Anesthesia Plan  ASA: II  Anesthesia Plan: General   Post-op Pain Management:    Induction: Intravenous  Airway Management Planned: LMA  Additional Equipment:   Intra-op Plan:   Post-operative Plan: Extubation in OR  Informed Consent: I have reviewed the patients History and Physical, chart, labs and discussed the procedure including the risks, benefits and alternatives for the proposed anesthesia with the patient or authorized representative who has indicated his/her understanding and acceptance.   Dental advisory given  Plan Discussed with: CRNA, Anesthesiologist and Surgeon  Anesthesia Plan Comments:         Anesthesia Quick Evaluation

## 2014-06-25 NOTE — Transfer of Care (Signed)
Immediate Anesthesia Transfer of Care Note  Patient: Zachary [name]  Procedure(s) Performed: Procedure(s): LEFT ARTHROSCOPY KNEE (Left) CHONDROPLASTY  Patient Location: PACU  Anesthesia Type:General  Level of Consciousness: awake and alert   Airway & Oxygen Therapy: Patient Spontanous Breathing and Patient connected to face mask oxygen  Post-op Assessment: Report given to RN and Post -op Vital signs reviewed and stable  Post vital signs: Reviewed and stable  Last Vitals:  Filed Vitals:   06/25/14 1549  BP:   Pulse: 67  Temp:   Resp: 16    Complications: No apparent anesthesia complications

## 2014-06-25 NOTE — Anesthesia Procedure Notes (Signed)
Procedure Name: LMA Insertion Date/Time: 06/25/2014 3:13 PM Performed by: Toula Moos L Pre-anesthesia Checklist: Patient identified, Emergency Drugs available, Suction available, Patient being monitored and Timeout performed Patient Re-evaluated:Patient Re-evaluated prior to inductionOxygen Delivery Method: Circle System Utilized Preoxygenation: Pre-oxygenation with 100% oxygen Intubation Type: IV induction Ventilation: Mask ventilation without difficulty LMA: LMA inserted LMA Size: 5.0 Number of attempts: 1 Airway Equipment and Method: Bite block Placement Confirmation: positive ETCO2 Tube secured with: Tape Dental Injury: Teeth and Oropharynx as per pre-operative assessment

## 2014-06-25 NOTE — Interval H&P Note (Signed)
OK for surgery PD 

## 2014-06-28 ENCOUNTER — Encounter (HOSPITAL_BASED_OUTPATIENT_CLINIC_OR_DEPARTMENT_OTHER): Payer: Self-pay | Admitting: Orthopaedic Surgery

## 2014-06-28 NOTE — Addendum Note (Signed)
Addendum  created 06/28/14 1159 by Tawni Millers, CRNA   Modules edited: Charges VN

## 2014-06-28 NOTE — Op Note (Signed)
NAME:  THERESA, DOHRMAN                  ACCOUNT NO.:  [id number]  MEDICAL RECORD NO.:  [id number]  LOCATION:                                FACILITY:  [facility]  PHYSICIAN:  Monico Blitz. Thedore Pickel, M.D.DATE OF BIRTH:  [birth year]  DATE OF PROCEDURE:  06/25/2014 DATE OF DISCHARGE:  06/25/2014                              OPERATIVE REPORT   PREOPERATIVE DIAGNOSIS:  Left knee torn medial meniscus.  POSTOPERATIVE DIAGNOSIS:  Left knee torn medial meniscus.  PROCEDURE:  Left knee partial medial meniscectomy.  ANESTHESIA:  General.  ATTENDING SURGEON:  Monico Blitz. Rhona Raider, M.D.  ASSISTANT:  Loni Dolly, PA  INDICATION FOR PROCEDURE:  The patient is a 74 year old man with many months of intense left knee pain.  This is persisted despite oral anti- inflammatories and an injection as well, some activity restriction.  By MRI scan, he has a medial meniscus tear.  He has pain which limits his ability to rest and remains active, and he is offered an arthroscopy. Informed operative consent was obtained after discussion of possible complications including reaction to anesthesia and infection.  SUMMARY OF FINDINGS AND PROCEDURE:  Under general anesthesia, a left knee arthroscopy was performed.  Suprapatellar pouch was benign as was the patellofemoral joint.  Medial compartment exhibited a degenerative tear of the posterior horn of the medial meniscus, addressed with about 10% partial medial meniscectomy.  There were no degenerative changes in this compartment.  ACL looked normal.  The lateral compartment was also benign with no evidence of meniscal or articular cartilage injury.  He was discharged home the same day to follow up in less than a week.  DESCRIPTION OF PROCEDURE:  The patient was taken to the operating suite, where general anesthetic was applied without difficulty.  He was positioned supine and prepped and draped in normal sterile fashion. After the administration of preop IV Kefzol and  appropriate time out, an arthroscopy of the left knee was performed through total of 2 portals. Findings were as noted above and procedure consisted of the partial medial meniscectomy which was done with a basket and shaver involving the posterior horn.  The knee was thoroughly irrigated, followed by placement of Marcaine with epinephrine and morphine.  Adaptic was placed over the portals followed by dry gauze and a loose Ace wrap.  ESTIMATED BLOOD LOSS AND FLUIDS:  Can be obtained from anesthesia records as can accurate tourniquet time.  DISPOSITION:  The patient was extubated in the operating room and taken to the recovery in stable addition.  He was to go home same day and to follow up in the office in less than a week.  I will contact him by phone tonight.     Monico Blitz Rhona Raider, M.D.     PGD/MEDQ  D:  06/25/2014  T:  06/25/2014  Job:  [id number]

## 2015-03-16 DIAGNOSIS — K5730 Diverticulosis of large intestine without perforation or abscess without bleeding: Secondary | ICD-10-CM | POA: Diagnosis not present

## 2015-03-16 DIAGNOSIS — Z0001 Encounter for general adult medical examination with abnormal findings: Secondary | ICD-10-CM | POA: Diagnosis not present

## 2015-03-16 DIAGNOSIS — Z79899 Other long term (current) drug therapy: Secondary | ICD-10-CM | POA: Diagnosis not present

## 2015-03-16 DIAGNOSIS — Z1389 Encounter for screening for other disorder: Secondary | ICD-10-CM | POA: Diagnosis not present

## 2015-03-16 DIAGNOSIS — R972 Elevated prostate specific antigen [PSA]: Secondary | ICD-10-CM | POA: Diagnosis not present

## 2015-03-16 DIAGNOSIS — G609 Hereditary and idiopathic neuropathy, unspecified: Secondary | ICD-10-CM | POA: Diagnosis not present

## 2015-03-16 DIAGNOSIS — I8390 Asymptomatic varicose veins of unspecified lower extremity: Secondary | ICD-10-CM | POA: Diagnosis not present

## 2015-03-16 DIAGNOSIS — M1990 Unspecified osteoarthritis, unspecified site: Secondary | ICD-10-CM | POA: Diagnosis not present

## 2015-03-16 DIAGNOSIS — E785 Hyperlipidemia, unspecified: Secondary | ICD-10-CM | POA: Diagnosis not present

## 2015-03-16 DIAGNOSIS — M5030 Other cervical disc degeneration, unspecified cervical region: Secondary | ICD-10-CM | POA: Diagnosis not present

## 2015-03-16 DIAGNOSIS — I10 Essential (primary) hypertension: Secondary | ICD-10-CM | POA: Diagnosis not present

## 2015-03-16 DIAGNOSIS — J310 Chronic rhinitis: Secondary | ICD-10-CM | POA: Diagnosis not present

## 2015-12-12 DIAGNOSIS — M1711 Unilateral primary osteoarthritis, right knee: Secondary | ICD-10-CM | POA: Diagnosis not present

## 2015-12-19 DIAGNOSIS — M1711 Unilateral primary osteoarthritis, right knee: Secondary | ICD-10-CM | POA: Diagnosis not present

## 2015-12-26 DIAGNOSIS — M1711 Unilateral primary osteoarthritis, right knee: Secondary | ICD-10-CM | POA: Diagnosis not present

## 2016-04-17 DIAGNOSIS — M5030 Other cervical disc degeneration, unspecified cervical region: Secondary | ICD-10-CM | POA: Diagnosis not present

## 2016-04-17 DIAGNOSIS — R972 Elevated prostate specific antigen [PSA]: Secondary | ICD-10-CM | POA: Diagnosis not present

## 2016-04-17 DIAGNOSIS — M1990 Unspecified osteoarthritis, unspecified site: Secondary | ICD-10-CM | POA: Diagnosis not present

## 2016-04-17 DIAGNOSIS — I8390 Asymptomatic varicose veins of unspecified lower extremity: Secondary | ICD-10-CM | POA: Diagnosis not present

## 2016-04-17 DIAGNOSIS — E785 Hyperlipidemia, unspecified: Secondary | ICD-10-CM | POA: Diagnosis not present

## 2016-04-17 DIAGNOSIS — Z0001 Encounter for general adult medical examination with abnormal findings: Secondary | ICD-10-CM | POA: Diagnosis not present

## 2016-04-17 DIAGNOSIS — J310 Chronic rhinitis: Secondary | ICD-10-CM | POA: Diagnosis not present

## 2016-04-17 DIAGNOSIS — G609 Hereditary and idiopathic neuropathy, unspecified: Secondary | ICD-10-CM | POA: Diagnosis not present

## 2016-04-17 DIAGNOSIS — Z1389 Encounter for screening for other disorder: Secondary | ICD-10-CM | POA: Diagnosis not present

## 2016-04-17 DIAGNOSIS — K30 Functional dyspepsia: Secondary | ICD-10-CM | POA: Diagnosis not present

## 2016-04-17 DIAGNOSIS — I10 Essential (primary) hypertension: Secondary | ICD-10-CM | POA: Diagnosis not present

## 2016-04-17 DIAGNOSIS — Z79899 Other long term (current) drug therapy: Secondary | ICD-10-CM | POA: Diagnosis not present

## 2016-04-17 DIAGNOSIS — K5730 Diverticulosis of large intestine without perforation or abscess without bleeding: Secondary | ICD-10-CM | POA: Diagnosis not present

## 2016-09-25 DIAGNOSIS — I10 Essential (primary) hypertension: Secondary | ICD-10-CM | POA: Diagnosis not present

## 2016-09-25 DIAGNOSIS — R972 Elevated prostate specific antigen [PSA]: Secondary | ICD-10-CM | POA: Diagnosis not present

## 2016-10-22 DIAGNOSIS — R972 Elevated prostate specific antigen [PSA]: Secondary | ICD-10-CM | POA: Diagnosis not present

## 2016-11-07 DIAGNOSIS — I10 Essential (primary) hypertension: Secondary | ICD-10-CM | POA: Diagnosis not present

## 2016-11-07 DIAGNOSIS — R972 Elevated prostate specific antigen [PSA]: Secondary | ICD-10-CM | POA: Diagnosis not present

## 2016-11-27 DIAGNOSIS — I10 Essential (primary) hypertension: Secondary | ICD-10-CM | POA: Diagnosis not present

## 2017-01-01 DIAGNOSIS — Z5181 Encounter for therapeutic drug level monitoring: Secondary | ICD-10-CM | POA: Diagnosis not present

## 2017-01-01 DIAGNOSIS — I10 Essential (primary) hypertension: Secondary | ICD-10-CM | POA: Diagnosis not present

## 2017-01-01 DIAGNOSIS — Z23 Encounter for immunization: Secondary | ICD-10-CM | POA: Diagnosis not present

## 2017-02-27 DIAGNOSIS — J069 Acute upper respiratory infection, unspecified: Secondary | ICD-10-CM | POA: Diagnosis not present

## 2017-04-30 DIAGNOSIS — I8390 Asymptomatic varicose veins of unspecified lower extremity: Secondary | ICD-10-CM | POA: Diagnosis not present

## 2017-04-30 DIAGNOSIS — Z79899 Other long term (current) drug therapy: Secondary | ICD-10-CM | POA: Diagnosis not present

## 2017-04-30 DIAGNOSIS — E782 Mixed hyperlipidemia: Secondary | ICD-10-CM | POA: Diagnosis not present

## 2017-04-30 DIAGNOSIS — Z0001 Encounter for general adult medical examination with abnormal findings: Secondary | ICD-10-CM | POA: Diagnosis not present

## 2017-04-30 DIAGNOSIS — J310 Chronic rhinitis: Secondary | ICD-10-CM | POA: Diagnosis not present

## 2017-04-30 DIAGNOSIS — K5730 Diverticulosis of large intestine without perforation or abscess without bleeding: Secondary | ICD-10-CM | POA: Diagnosis not present

## 2017-04-30 DIAGNOSIS — M5137 Other intervertebral disc degeneration, lumbosacral region: Secondary | ICD-10-CM | POA: Diagnosis not present

## 2017-04-30 DIAGNOSIS — Z1389 Encounter for screening for other disorder: Secondary | ICD-10-CM | POA: Diagnosis not present

## 2017-04-30 DIAGNOSIS — I10 Essential (primary) hypertension: Secondary | ICD-10-CM | POA: Diagnosis not present

## 2017-04-30 DIAGNOSIS — R972 Elevated prostate specific antigen [PSA]: Secondary | ICD-10-CM | POA: Diagnosis not present

## 2018-05-13 DIAGNOSIS — Z0001 Encounter for general adult medical examination with abnormal findings: Secondary | ICD-10-CM | POA: Diagnosis not present

## 2018-05-13 DIAGNOSIS — I10 Essential (primary) hypertension: Secondary | ICD-10-CM | POA: Diagnosis not present

## 2018-05-13 DIAGNOSIS — R972 Elevated prostate specific antigen [PSA]: Secondary | ICD-10-CM | POA: Diagnosis not present

## 2018-05-13 DIAGNOSIS — K5730 Diverticulosis of large intestine without perforation or abscess without bleeding: Secondary | ICD-10-CM | POA: Diagnosis not present

## 2018-05-13 DIAGNOSIS — Z1389 Encounter for screening for other disorder: Secondary | ICD-10-CM | POA: Diagnosis not present

## 2018-05-13 DIAGNOSIS — Z79899 Other long term (current) drug therapy: Secondary | ICD-10-CM | POA: Diagnosis not present

## 2018-05-13 DIAGNOSIS — E782 Mixed hyperlipidemia: Secondary | ICD-10-CM | POA: Diagnosis not present

## 2018-08-25 ENCOUNTER — Emergency Department (HOSPITAL_COMMUNITY): Payer: PPO

## 2018-08-25 ENCOUNTER — Encounter (HOSPITAL_COMMUNITY): Payer: Self-pay | Admitting: Emergency Medicine

## 2018-08-25 ENCOUNTER — Other Ambulatory Visit: Payer: Self-pay

## 2018-08-25 ENCOUNTER — Inpatient Hospital Stay (HOSPITAL_COMMUNITY)
Admission: EM | Admit: 2018-08-25 | Discharge: 2018-09-02 | DRG: 234 | Disposition: A | Discharge status: Home / Self Care | Payer: PPO | Attending: Thoracic Surgery (Cardiothoracic Vascular Surgery) | Admitting: Thoracic Surgery (Cardiothoracic Vascular Surgery)

## 2018-08-25 DIAGNOSIS — I10 Essential (primary) hypertension: Secondary | ICD-10-CM | POA: Diagnosis present

## 2018-08-25 DIAGNOSIS — I97611 Postprocedural hemorrhage and hematoma of a circulatory system organ or structure following cardiac bypass: Secondary | ICD-10-CM | POA: Diagnosis not present

## 2018-08-25 DIAGNOSIS — I471 Supraventricular tachycardia, unspecified: Secondary | ICD-10-CM | POA: Diagnosis not present

## 2018-08-25 DIAGNOSIS — I2584 Coronary atherosclerosis due to calcified coronary lesion: Secondary | ICD-10-CM | POA: Diagnosis not present

## 2018-08-25 DIAGNOSIS — Z951 Presence of aortocoronary bypass graft: Secondary | ICD-10-CM

## 2018-08-25 DIAGNOSIS — Z4689 Encounter for fitting and adjustment of other specified devices: Secondary | ICD-10-CM | POA: Diagnosis not present

## 2018-08-25 DIAGNOSIS — D62 Acute posthemorrhagic anemia: Secondary | ICD-10-CM | POA: Diagnosis not present

## 2018-08-25 DIAGNOSIS — E876 Hypokalemia: Secondary | ICD-10-CM | POA: Diagnosis not present

## 2018-08-25 DIAGNOSIS — R079 Chest pain, unspecified: Secondary | ICD-10-CM | POA: Diagnosis not present

## 2018-08-25 DIAGNOSIS — Z01810 Encounter for preprocedural cardiovascular examination: Secondary | ICD-10-CM | POA: Diagnosis not present

## 2018-08-25 DIAGNOSIS — I252 Old myocardial infarction: Secondary | ICD-10-CM | POA: Diagnosis not present

## 2018-08-25 DIAGNOSIS — I4510 Unspecified right bundle-branch block: Secondary | ICD-10-CM | POA: Diagnosis not present

## 2018-08-25 DIAGNOSIS — Z4682 Encounter for fitting and adjustment of non-vascular catheter: Secondary | ICD-10-CM | POA: Diagnosis not present

## 2018-08-25 DIAGNOSIS — Z79899 Other long term (current) drug therapy: Secondary | ICD-10-CM | POA: Diagnosis not present

## 2018-08-25 DIAGNOSIS — I700 Atherosclerosis of aorta: Secondary | ICD-10-CM | POA: Diagnosis not present

## 2018-08-25 DIAGNOSIS — I25110 Atherosclerotic heart disease of native coronary artery with unstable angina pectoris: Secondary | ICD-10-CM | POA: Diagnosis present

## 2018-08-25 DIAGNOSIS — Z8249 Family history of ischemic heart disease and other diseases of the circulatory system: Secondary | ICD-10-CM

## 2018-08-25 DIAGNOSIS — J90 Pleural effusion, not elsewhere classified: Secondary | ICD-10-CM | POA: Diagnosis not present

## 2018-08-25 DIAGNOSIS — E8770 Fluid overload, unspecified: Secondary | ICD-10-CM | POA: Diagnosis not present

## 2018-08-25 DIAGNOSIS — R918 Other nonspecific abnormal finding of lung field: Secondary | ICD-10-CM | POA: Diagnosis not present

## 2018-08-25 DIAGNOSIS — Z7982 Long term (current) use of aspirin: Secondary | ICD-10-CM | POA: Diagnosis not present

## 2018-08-25 DIAGNOSIS — I214 Non-ST elevation (NSTEMI) myocardial infarction: Secondary | ICD-10-CM | POA: Diagnosis present

## 2018-08-25 DIAGNOSIS — D696 Thrombocytopenia, unspecified: Secondary | ICD-10-CM | POA: Diagnosis not present

## 2018-08-25 DIAGNOSIS — Z20828 Contact with and (suspected) exposure to other viral communicable diseases: Secondary | ICD-10-CM | POA: Diagnosis not present

## 2018-08-25 DIAGNOSIS — I440 Atrioventricular block, first degree: Secondary | ICD-10-CM | POA: Diagnosis present

## 2018-08-25 DIAGNOSIS — Z87891 Personal history of nicotine dependence: Secondary | ICD-10-CM | POA: Diagnosis not present

## 2018-08-25 DIAGNOSIS — J9811 Atelectasis: Secondary | ICD-10-CM

## 2018-08-25 DIAGNOSIS — I9789 Other postprocedural complications and disorders of the circulatory system, not elsewhere classified: Secondary | ICD-10-CM | POA: Diagnosis not present

## 2018-08-25 DIAGNOSIS — I4891 Unspecified atrial fibrillation: Secondary | ICD-10-CM | POA: Diagnosis not present

## 2018-08-25 DIAGNOSIS — Z79891 Long term (current) use of opiate analgesic: Secondary | ICD-10-CM | POA: Diagnosis not present

## 2018-08-25 DIAGNOSIS — R0689 Other abnormalities of breathing: Secondary | ICD-10-CM | POA: Diagnosis not present

## 2018-08-25 DIAGNOSIS — I2510 Atherosclerotic heart disease of native coronary artery without angina pectoris: Secondary | ICD-10-CM | POA: Diagnosis not present

## 2018-08-25 LAB — CBC
HCT: 46.5 % (ref 39.0–52.0)
Hemoglobin: 15.8 g/dL (ref 13.0–17.0)
MCH: 31.5 pg (ref 26.0–34.0)
MCHC: 34 g/dL (ref 30.0–36.0)
MCV: 92.8 fL (ref 80.0–100.0)
Platelets: 313 10*3/uL (ref 150–400)
RBC: 5.01 MIL/uL (ref 4.22–5.81)
RDW: 11.9 % (ref 11.5–15.5)
WBC: 7.4 10*3/uL (ref 4.0–10.5)
nRBC: 0 % (ref 0.0–0.2)

## 2018-08-25 LAB — HEMOGLOBIN A1C
Hgb A1c MFr Bld: 5.5 % (ref 4.8–5.6)
Mean Plasma Glucose: 111.15 mg/dL

## 2018-08-25 LAB — CBG MONITORING, ED: Glucose-Capillary: 80 mg/dL (ref 70–99)

## 2018-08-25 LAB — BASIC METABOLIC PANEL
Anion gap: 10 (ref 5–15)
BUN: 17 mg/dL (ref 8–23)
CO2: 30 mmol/L (ref 22–32)
Calcium: 9.7 mg/dL (ref 8.9–10.3)
Chloride: 100 mmol/L (ref 98–111)
Creatinine, Ser: 1.24 mg/dL (ref 0.61–1.24)
GFR calc Af Amer: >60 mL/min (ref >60)
GFR calc non Af Amer: 55 mL/min — ABNORMAL LOW (ref >60)
Glucose, Bld: 110 mg/dL — ABNORMAL HIGH (ref 70–99)
Potassium: 3.6 mmol/L (ref 3.5–5.1)
Sodium: 140 mmol/L (ref 135–145)

## 2018-08-25 LAB — TROPONIN I
Troponin I: 0.66 ng/mL (ref <0.03)
Troponin I: 1.01 ng/mL (ref <0.03)
Troponin I: 1.03 ng/mL (ref <0.03)

## 2018-08-25 LAB — HEPARIN LEVEL (UNFRACTIONATED): Heparin Unfractionated: 1.1 IU/mL — ABNORMAL HIGH (ref 0.30–0.70)

## 2018-08-25 LAB — SARS CORONAVIRUS 2 BY RT PCR (HOSPITAL ORDER, PERFORMED IN ~~LOC~~ HOSPITAL LAB): SARS Coronavirus 2: NEGATIVE

## 2018-08-25 MED ORDER — AMLODIPINE BESYLATE 10 MG PO TABS
10.0000 mg | ORAL_TABLET | Freq: Every day | ORAL | Status: DC
  Administered 2018-08-26: 10 mg via ORAL
  Filled 2018-08-25: qty 1

## 2018-08-25 MED ORDER — ASPIRIN 81 MG PO CHEW
324.0000 mg | CHEWABLE_TABLET | Freq: Once | ORAL | Status: AC
  Administered 2018-08-25: 324 mg via ORAL
  Filled 2018-08-25: qty 4

## 2018-08-25 MED ORDER — NITROGLYCERIN 2 % TD OINT
1.0000 [in_us] | TOPICAL_OINTMENT | Freq: Four times a day (QID) | TRANSDERMAL | Status: DC
  Administered 2018-08-25 – 2018-08-26 (×4): 1 [in_us] via TOPICAL
  Filled 2018-08-25: qty 30

## 2018-08-25 MED ORDER — SODIUM CHLORIDE 0.9% FLUSH
3.0000 mL | Freq: Once | INTRAVENOUS | Status: AC
  Administered 2018-08-25: 3 mL via INTRAVENOUS

## 2018-08-25 MED ORDER — HEPARIN (PORCINE) 25000 UT/250ML-% IV SOLN
650.0000 [IU]/h | INTRAVENOUS | Status: DC
  Administered 2018-08-26: 650 [IU]/h via INTRAVENOUS

## 2018-08-25 MED ORDER — SODIUM CHLORIDE 0.9% FLUSH: 3.0000 mL | INTRAVENOUS | Status: DC | PRN

## 2018-08-25 MED ORDER — ADULT MULTIVITAMIN W/MINERALS CH
1.0000 | ORAL_TABLET | Freq: Every day | ORAL | Status: DC
  Administered 2018-08-26: 11:00:00 1 via ORAL
  Filled 2018-08-25: qty 1

## 2018-08-25 MED ORDER — SODIUM CHLORIDE 0.9% FLUSH: 3.0000 mL | Freq: Two times a day (BID) | INTRAVENOUS | Status: DC

## 2018-08-25 MED ORDER — SODIUM CHLORIDE 0.9 % WEIGHT BASED INFUSION
3.0000 mL/kg/h | INTRAVENOUS | Status: AC
  Administered 2018-08-26: 3 mL/kg/h via INTRAVENOUS

## 2018-08-25 MED ORDER — ACETAMINOPHEN 325 MG PO TABS: 650.0000 mg | ORAL_TABLET | ORAL | Status: DC | PRN

## 2018-08-25 MED ORDER — SODIUM CHLORIDE 0.9 % WEIGHT BASED INFUSION
1.0000 mL/kg/h | INTRAVENOUS | Status: DC
  Administered 2018-08-26: 1 mL/kg/h via INTRAVENOUS

## 2018-08-25 MED ORDER — LOSARTAN POTASSIUM 50 MG PO TABS
100.0000 mg | ORAL_TABLET | Freq: Every day | ORAL | Status: DC
  Administered 2018-08-26: 100 mg via ORAL
  Filled 2018-08-25: qty 2

## 2018-08-25 MED ORDER — CHLORTHALIDONE 25 MG PO TABS
25.0000 mg | ORAL_TABLET | Freq: Every day | ORAL | Status: DC
  Administered 2018-08-26: 25 mg via ORAL
  Filled 2018-08-25 (×2): qty 1

## 2018-08-25 MED ORDER — ONDANSETRON HCL 4 MG/2ML IJ SOLN: 4.0000 mg | Freq: Four times a day (QID) | INTRAMUSCULAR | Status: DC | PRN

## 2018-08-25 MED ORDER — ASPIRIN EC 81 MG PO TBEC: 81.0000 mg | DELAYED_RELEASE_TABLET | ORAL | Status: DC

## 2018-08-25 MED ORDER — HEPARIN BOLUS VIA INFUSION
3000.0000 [IU] | Freq: Once | INTRAVENOUS | Status: AC
  Administered 2018-08-25: 3000 [IU] via INTRAVENOUS
  Filled 2018-08-25: qty 3000

## 2018-08-25 MED ORDER — ATORVASTATIN CALCIUM 80 MG PO TABS: 80.0000 mg | ORAL_TABLET | Freq: Every day | ORAL | Status: DC

## 2018-08-25 MED ORDER — SODIUM CHLORIDE 0.9 % IV SOLN: 250.0000 mL | INTRAVENOUS | Status: DC | PRN

## 2018-08-25 MED ORDER — HEPARIN (PORCINE) 25000 UT/250ML-% IV SOLN
800.0000 [IU]/h | INTRAVENOUS | Status: DC
  Administered 2018-08-25: 15:00:00 800 [IU]/h via INTRAVENOUS
  Filled 2018-08-25: qty 250

## 2018-08-25 MED ORDER — ASPIRIN 81 MG PO CHEW
81.0000 mg | CHEWABLE_TABLET | ORAL | Status: AC
  Administered 2018-08-26: 81 mg via ORAL
  Filled 2018-08-25: qty 1

## 2018-08-25 MED ORDER — NITROGLYCERIN 0.4 MG SL SUBL: 0.4000 mg | SUBLINGUAL_TABLET | SUBLINGUAL | Status: DC | PRN

## 2018-08-25 NOTE — Progress Notes (Signed)
[facility] for Heparin  Indication: chest pain/ACS  No Known Allergies  Patient Measurements: HEPARIN DW (KG): 62.4  Vital Signs: Temp: 98.1 F (36.7 C) (06/22 1928) Temp Source: Oral (06/22 1928) BP: 134/77 (06/22 1928) Pulse Rate: 59 (06/22 1928)  Labs: Recent Labs    08/25/18 1135 08/25/18 1839 08/25/18 2258  HGB 15.8  --   --   HCT 46.5  --   --   PLT 313  --   --   HEPARINUNFRC  --   --  1.10*  CREATININE 1.24  --   --   TROPONINI 0.66* 1.01*  --     Estimated Creatinine Clearance: 39.5 mL/min (by C-G formula based on SCr of 1.24 mg/dL).   Medical History: Past Medical History:  Diagnosis Date  . Hypertension    Assessment: Zachary [name] is a 78yo male admitted with 1 week hx of waxing/waning chest pain/pressure. Admit with NSTEMI; troponins elevated at 0.66. Pharmacy consulted to start heparin. No anticoagulants PTA. Hgb 15.8 and pltc 313.  6/22 PM update:   Initial heparin level supra-therapeutic  No issues per RN  Goal of Therapy:  Heparin level 0.3-0.7 units/ml Monitor platelets by anticoagulation protocol: Yes   Plan:  Hold heparin x 1 hr Re-start heparin at 650 units/hr at 0030  Re-check heparin level at 0830 Monitor daily heparin level, CBC, and s/sx of bleeding  Narda Bonds, PharmD, BCPS Clinical Pharmacist Phone: [phone]

## 2018-08-25 NOTE — ED Notes (Signed)
Got patient hooked up to the monitor checked patient cbg it was 51 notified RN Cindy of blood sugar patient is resting with call bell in reach

## 2018-08-25 NOTE — ED Triage Notes (Signed)
Pt reports chest pain that radiates down both arms x3 days, woke him up from sleep last night. Worse with exertion, denies sob, fever, chills cough, sore throat or body aches.

## 2018-08-25 NOTE — Progress Notes (Signed)
ANTICOAGULATION CONSULT NOTE - Initial Consult  Pharmacy Consult for heparin Indication: chest pain/ACS  No Known Allergies  Patient Measurements: HEPARIN DW (KG): 64.4  Vital Signs: Temp: 97.7 F (36.5 C) (06/22 1130) BP: 133/87 (06/22 1400) Pulse Rate: 68 (06/22 1415)  Labs: Recent Labs    08/25/18 1135  HGB 15.8  HCT 46.5  PLT 313  CREATININE 1.24  TROPONINI 0.66*    CrCl cannot be calculated (Unknown ideal weight.).   Medical History: Past Medical History:  Diagnosis Date  . Hypertension    Assessment: Zachary [name] is a 78yo male admitted with 1 week hx of waxing/waning chest pain/pressure. Admit with NSTEMI; troponins elevated at 0.66. Pharmacy consulted to start heparin. No anticoagulants PTA. Hgb 15.8 and pltc 313.  Goal of Therapy:  Heparin level 0.3-0.7 units/ml Monitor platelets by anticoagulation protocol: Yes   Plan:  Heparin bolus 3000 units x1 Start heparin infusion at 800 units/hr Heparin level at 2300 Monitor daily heparin level, CBC, and s/sx of bleeding  Thank you for involving pharmacy in this patient's care.  Janae Bridgeman, PharmD PGY1 Pharmacy Resident Phone: [phone] 08/25/2018 2:29 PM

## 2018-08-25 NOTE — H&P (Signed)
Cardiology Admission History and Physical:   Patient ID: Zachary [name] MRN: [id number]; DOB: 07-Nov-1940   Admission date: 08/25/2018  Primary Care Provider: Josetta Huddle, MD Primary Cardiologist: New to [facility]  Chief Complaint:  CP  Patient Profile:   Zachary [name] is a 78 y.o. male with no significant PMH except HTN presented for CP.   History of Present Illness:   Zachary [name] presented for evaluation of substernal chest pain.  Patient did heavy lifting and painting about a week ago.  Since then he had a waxing and waning substernal chest pressure with activity.  Last night he woke up from sleep at 11:30 PM with chest pressure radiating to left arm.  He took baby aspirin and Tylenol and pain eventually resolved and he fall asleep.  However, he had a recurrent pain this morning and came to ER for further evaluation.  Patient denies prior history of similar pain.  No prior cardiac history.  Patient denies palpitation, orthopnea, PND, syncope, lower extremity edema, fever, cough, chills or exposure to COVID-19.  In emergency room, creatinine 1.24.  Potassium 3.6.  Troponin I 0.66.  Hemoglobin normal.  Chest x-ray without acute abnormality.  EKG shows sinus bradycardia at rate of 55 bpm and chronic right bundle branch block-personally reviewed.  He was given aspirin 324 mg in ER.  Mild chest discomfort during my evaluation.  Rapid COVID testing pending.   Past Medical History:  Diagnosis Date  . Hypertension     Past Surgical History:  Procedure Laterality Date  . CHONDROPLASTY  06/25/2014   Procedure: CHONDROPLASTY;  Surgeon: Melrose Nakayama, MD;  Location: [facility];  Service: Orthopedics;;  . Growth removed from nose     age 63 yrs.  Marland Kitchen KNEE ARTHROSCOPY WITH MEDIAL MENISECTOMY Left 06/25/2014   Procedure: LEFT ARTHROSCOPY KNEE,partial medial menisectomy;  Surgeon: Melrose Nakayama, MD;  Location: [facility];  Service: Orthopedics;  Laterality: Left;  . Lower back  surgery     Pinched nerve - 2010  . Upper back surgery     Plate and pins     Medications Prior to Admission: Prior to Admission medications   Medication Sig Start Date End Date Taking? Authorizing Provider  acetaminophen (TYLENOL) 500 MG tablet Take 500 mg by mouth every 6 (six) hours as needed.    [provider]  amLODipine (NORVASC) 5 MG tablet Take 5 mg by mouth daily.    [provider]  aspirin 81 MG tablet Take 81 mg by mouth daily.    [provider]  HYDROcodone-acetaminophen (NORCO) 5-325 MG per tablet Take 1-2 tablets by mouth every 6 (six) hours as needed for moderate pain. 06/25/14   Loni Dolly, PA-C  LOSARTAN POTASSIUM-HCTZ PO Take 100 mg by mouth daily.    [provider]  Multiple Vitamin (MULTIVITAMIN) tablet Take 1 tablet by mouth daily.    [provider]     Allergies:   No Known Allergies  Social History:   Social History   Socioeconomic History  . Marital status: Married    Spouse name: Not on file  . Number of children: Not on file  . Years of education: Not on file  . Highest education level: Not on file  Occupational History  . Not on file  Social Needs  . Financial resource strain: Not on file  . Food insecurity    Worry: Not on file    Inability: Not on file  . Transportation needs  Medical: Not on file    Non-medical: Not on file  Tobacco Use  . Smoking status: Former Smoker    Types: Cigarettes  . Tobacco comment: Quit smoking 50 years ago  Substance and Sexual Activity  . Alcohol use: No  . Drug use: No  . Sexual activity: Not on file  Lifestyle  . Physical activity    Days per week: Not on file    Minutes per session: Not on file  . Stress: Not on file  Relationships  . Social Herbalist on phone: Not on file    Gets together: Not on file    Attends religious service: Not on file    Active member of club or organization: Not on file    Attends meetings of clubs or  organizations: Not on file    Relationship status: Not on file  . Intimate partner violence    Fear of current or ex partner: Not on file    Emotionally abused: Not on file    Physically abused: Not on file    Forced sexual activity: Not on file  Other Topics Concern  . Not on file  Social History Narrative  . Not on file    Family History:   Patient reports "heart disease" to father and mother.  Father died suddenly "[place] at age 35".  Unable to provide any other information  ROS:  Please see the history of present illness.  All other ROS reviewed and negative.     Physical Exam/Data:   Vitals:   08/25/18 1130 08/25/18 1352 08/25/18 1353  BP: 127/71 (!) 145/78   Pulse: (!) 56  (!) 58  Resp: 15 15 18   Temp: 97.7 F (36.5 C)    SpO2: 96%  98%   No intake or output data in the 24 hours ending 08/25/18 1407 Last 3 Weights 06/25/2014 06/24/2014  Weight (lbs) 142 lb 143 lb  Weight (kg) 64.411 kg 64.864 kg     There is no height or weight on file to calculate BMI.  General:  Well nourished, well developed, in no acute distress HEENT: normal Lymph: no adenopathy Neck: no JVD Endocrine:  No thryomegaly Vascular: No carotid bruits; FA pulses 2+ bilaterally without bruits  Cardiac:  normal S1, S2; RRR; no murmur  Lungs:  clear to auscultation bilaterally, no wheezing, rhonchi or rales  Abd: soft, nontender, no hepatomegaly  Ext: no edema Musculoskeletal:  No deformities, BUE and BLE strength normal and equal Skin: warm and dry  Neuro:  CNs 2-12 intact, no focal abnormalities noted Psych:  Normal affect    Relevant CV Studies: As summarized above  Laboratory Data:  Chemistry Recent Labs  Lab 08/25/18 1135  NA 140  K 3.6  CL 100  CO2 30  GLUCOSE 110*  BUN 17  CREATININE 1.24  CALCIUM 9.7  GFRNONAA 55*  GFRAA >60  ANIONGAP 10    No results for input(s): PROT, ALBUMIN, AST, ALT, ALKPHOS, BILITOT in the last 168 hours. Hematology Recent Labs  Lab 08/25/18  1135  WBC 7.4  RBC 5.01  HGB 15.8  HCT 46.5  MCV 92.8  MCH 31.5  MCHC 34.0  RDW 11.9  PLT 313   Cardiac Enzymes Recent Labs  Lab 08/25/18 1135  TROPONINI 0.66*   Radiology/Studies:  Dg Chest 2 View  Result Date: 08/25/2018 CLINICAL DATA:  Chest pain. EXAM: CHEST - 2 VIEW COMPARISON:  07/06/2008. FINDINGS: Mediastinum and hilar structures normal. Low lung volumes  with mild bibasilar atelectasis. No pleural effusion or pneumothorax. Heart size normal. Degenerative change thoracic spine. IMPRESSION: No acute abnormality. Electronically Signed   By: Gerrard   On: 08/25/2018 11:59    Assessment and Plan:   1. Non-STEMI Presented with 1 week history of waxing and waning chest pressure.  Equivalent to unstable angina.  Troponin I 0.66.  EKG without acute EKG changes.  Admit and cycle troponin.  Get Lipid panel. Pending COVID testing.  Plan cardiac cath later today otherwise tomorrow. - He got ASA 324mg  in ER. Start IV heparin.  - No BB given baseline bradycardia. Add statin.   2. HTN - Resume home medications.   Severity of Illness: The appropriate patient status for this patient is INPATIENT. Inpatient status is judged to be reasonable and necessary in order to provide the required intensity of service to ensure the patient's safety. The patient's presenting symptoms, physical exam findings, and initial radiographic and laboratory data in the context of their chronic comorbidities is felt to place them at high risk for further clinical deterioration. Furthermore, it is not anticipated that the patient will be medically stable for discharge from the hospital within 2 midnights of admission. The following factors support the patient status of inpatient.   " The patient's presenting symptoms include chest pain. " The worrisome physical exam findings include none. " The initial radiographic and laboratory data are worrisome because of positive enzymes. " The chronic  co-morbidities include hypertension.   * I certify that at the point of admission it is my clinical judgment that the patient will require inpatient hospital care spanning beyond 2 midnights from the point of admission due to high intensity of service, high risk for further deterioration and high frequency of surveillance required.*    For questions or updates, please contact [place] Please consult [email or web address] for contact info under        Signed, Leanor Kail, PA  08/25/2018 2:07 PM   Agree with note by Robbie Lis PA-C  Pleasant 78 year old fit appearing married Caucasian male with history of hypertension who is had several days of accelerated angina.  His troponins were mildly elevated.  His EKG shows right bundle branch block which was old.  His exam is benign.  Will admit, heparinize and put on topical nitrates.  We will cycle his enzymes and plan for diagnostic coronary angiography tomorrow.  The patient understands that risks included but are not limited to stroke (1 in 1000), death (1 in 41), kidney failure [usually temporary] (1 in 500), bleeding (1 in 200), allergic reaction [possibly serious] (1 in 200). The patient understands and agrees to proceed   Lorretta Harp, M.D., [place], [facility], [place], [place] [address]. [place], [place]  [address]  [phone] 08/25/2018 3:44 PM

## 2018-08-25 NOTE — ED Provider Notes (Signed)
[facility] EMERGENCY DEPARTMENT Provider Note   CSN: [id number] Arrival date & time: 08/25/18  1114     History   Chief Complaint Chief Complaint  Patient presents with  . Chest Pain    HPI Zachary [name] is a 78 y.o. male with a past medical history of hypertension presents to ED for central chest pain for the past 2 days.  States that the pain is sharp, will intermittently radiate down both of his arms.  States that the pain got worse last night which prompted him to sleep in upright position and take Tylenol.  He did note that the pain got worse with walking about 2 days ago.  He did have some relief with the Tylenol as he feels that he is currently chest pain-free.  He denies history of similar symptoms in the past.  He denies any personal history of CAD but does have a strong family history of it.  Reports compliance with his antihypertensives.  He denies any prior stress test, echoes or cardiology evaluation.  He denies any tobacco, drug or alcohol use.  Reports a slight cough due to his blood pressure medication but denies any fever, shortness of breath, diaphoresis, numbness in arms or legs, vision changes, known exposures to COVID-19 or recent COVID-19 testing.     HPI  Past Medical History:  Diagnosis Date  . Hypertension     There are no active problems to display for this patient.   Past Surgical History:  Procedure Laterality Date  . CHONDROPLASTY  06/25/2014   Procedure: CHONDROPLASTY;  Surgeon: Melrose Nakayama, MD;  Location: [facility];  Service: Orthopedics;;  . Growth removed from nose     age 10 yrs.  Marland Kitchen KNEE ARTHROSCOPY WITH MEDIAL MENISECTOMY Left 06/25/2014   Procedure: LEFT ARTHROSCOPY KNEE,partial medial menisectomy;  Surgeon: Melrose Nakayama, MD;  Location: [facility];  Service: Orthopedics;  Laterality: Left;  . Lower back surgery     Pinched nerve - 2010  . Upper back surgery     Plate and pins         Home Medications    Prior to Admission medications   Medication Sig Start Date End Date Taking? Authorizing Provider  acetaminophen (TYLENOL) 500 MG tablet Take 500 mg by mouth every 6 (six) hours as needed.    [provider]  amLODipine (NORVASC) 5 MG tablet Take 5 mg by mouth daily.    [provider]  aspirin 81 MG tablet Take 81 mg by mouth daily.    [provider]  HYDROcodone-acetaminophen (NORCO) 5-325 MG per tablet Take 1-2 tablets by mouth every 6 (six) hours as needed for moderate pain. 06/25/14   Loni Dolly, PA-C  LOSARTAN POTASSIUM-HCTZ PO Take 100 mg by mouth daily.    [provider]  Multiple Vitamin (MULTIVITAMIN) tablet Take 1 tablet by mouth daily.    [provider]    Family History No family history on file.  Social History Social History   Tobacco Use  . Smoking status: Former Smoker    Types: Cigarettes  . Tobacco comment: Quit smoking 50 years ago  Substance Use Topics  . Alcohol use: No  . Drug use: No     Allergies   Patient has no known allergies.   Review of Systems Review of Systems  Constitutional: Negative for appetite change, chills and fever.  HENT: Negative for ear pain, rhinorrhea, sneezing and sore throat.   Eyes: Negative  for photophobia and visual disturbance.  Respiratory: Positive for cough. Negative for chest tightness, shortness of breath and wheezing.   Cardiovascular: Positive for chest pain. Negative for palpitations.  Gastrointestinal: Negative for abdominal pain, blood in stool, constipation, diarrhea, nausea and vomiting.  Genitourinary: Negative for dysuria, hematuria and urgency.  Musculoskeletal: Negative for myalgias.  Skin: Negative for rash.  Neurological: Negative for dizziness, weakness and light-headedness.     Physical Exam Updated Vital Signs BP 127/71   Pulse (!) 56   Temp 97.7 F (36.5 C)   Resp 15   SpO2 96%   Physical Exam Vitals signs and nursing  note reviewed.  Constitutional:      General: He is not in acute distress.    Appearance: He is well-developed.     Comments: Speaking in complete sentences without difficulty.  HENT:     Head: Normocephalic and atraumatic.     Nose: Nose normal.  Eyes:     General: No scleral icterus.       Left eye: No discharge.     Conjunctiva/sclera: Conjunctivae normal.  Neck:     Musculoskeletal: Normal range of motion and neck supple.  Cardiovascular:     Rate and Rhythm: Normal rate and regular rhythm.     Heart sounds: Normal heart sounds. No murmur. No friction rub. No gallop.   Pulmonary:     Effort: Pulmonary effort is normal. No respiratory distress.     Breath sounds: Normal breath sounds.  Abdominal:     General: Bowel sounds are normal. There is no distension.     Palpations: Abdomen is soft.     Tenderness: There is no abdominal tenderness. There is no guarding.  Musculoskeletal: Normal range of motion.     Comments: No lower extremity edema, erythema or calf tenderness bilaterally.  Skin:    General: Skin is warm and dry.     Findings: No rash.  Neurological:     Mental Status: He is alert.     Motor: No abnormal muscle tone.     Coordination: Coordination normal.      ED Treatments / Results  Labs (all labs ordered are listed, but only abnormal results are displayed) Labs Reviewed  BASIC METABOLIC PANEL - Abnormal; Notable for the following components:      Result Value   Glucose, Bld 110 (*)    GFR calc non Af Amer 55 (*)    All other components within normal limits  TROPONIN I - Abnormal; Notable for the following components:   Troponin I 0.66 (*)    All other components within normal limits  SARS CORONAVIRUS 2 (HOSPITAL ORDER, [place] [facility])  CBC  CBG MONITORING, ED    EKG EKG Interpretation  Date/Time:  Monday August 25 2018 11:24:48 EDT Ventricular Rate:  55 PR Interval:    QRS Duration: 158 QT Interval:  432 QTC Calculation:  413 R Axis:   -95 Text Interpretation:  Sinus bradycardia with 1st degree A-V block Non-specific intra-ventricular conduction block Possible Lateral infarct , age undetermined Abnormal ECG Confirmed by Dene Gentry [phone]) on 08/25/2018 1:35:42 PM   Radiology Dg Chest 2 View  Result Date: 08/25/2018 CLINICAL DATA:  Chest pain. EXAM: CHEST - 2 VIEW COMPARISON:  07/06/2008. FINDINGS: Mediastinum and hilar structures normal. Low lung volumes with mild bibasilar atelectasis. No pleural effusion or pneumothorax. Heart size normal. Degenerative change thoracic spine. IMPRESSION: No acute abnormality. Electronically Signed   By: Marcello Moores  Register  On: 08/25/2018 11:59    Procedures .Critical Care Performed by: Delia Heady, PA-C Authorized by: Delia Heady, PA-C   Critical care provider statement:    Critical care time (minutes):  35   Critical care was necessary to treat or prevent imminent or life-threatening deterioration of the following conditions:  Cardiac failure and circulatory failure   Critical care was time spent personally by me on the following activities:  Discussions with consultants, examination of patient, obtaining history from patient or surrogate, ordering and performing treatments and interventions, ordering and review of laboratory studies, ordering and review of radiographic studies and review of old charts   I assumed direction of critical care for this patient from another provider in my specialty: no     (including critical care time)  Medications Ordered in ED Medications  sodium chloride flush (NS) 0.9 % injection 3 mL (has no administration in time range)  aspirin chewable tablet 324 mg (has no administration in time range)     Initial Impression / Assessment and Plan / ED Course  I have reviewed the triage vital signs and the nursing notes.  Pertinent labs & imaging results that were available during my care of the patient were reviewed by me and considered  in my medical decision making (see chart for details).        78 year old male with past medical history of hypertension presents to ED for chest pain for the past 2 days.  Has not had a prior cardiac evaluation.  Currently chest pain-free.  Denies any alcohol, tobacco use, hyperlipidemia, diabetes but does endorse a strong family history of CAD.  EKG here shows sinus bradycardia, similar pattern to prior EKG. troponin elevated 2.66.  CBC, BMP unremarkable.  Chest x-ray is unremarkable.  COVID swab is pending.  1:55 PM I have consulted cardiology who will evaluate the patient.  Awaiting their recommendations for anticoagulation.  Please see their note for further detail. Will admit for further management and workup.    Portions of this note were generated with [employer]. Dictation errors may occur despite best attempts at proofreading.   Final Clinical Impressions(s) / ED Diagnoses   Final diagnoses:  NSTEMI (non-ST elevated myocardial infarction) [facility])    ED Discharge Orders    None       Delia Heady, PA-C 08/25/18 1459    Valarie Merino, MD 09/08/18 1159

## 2018-08-26 ENCOUNTER — Ambulatory Visit (HOSPITAL_COMMUNITY): Admission: AD | Admit: 2018-08-26 | Payer: PPO | Source: Home / Self Care | Admitting: Cardiovascular Disease

## 2018-08-26 ENCOUNTER — Encounter (HOSPITAL_COMMUNITY)
Admission: EM | Disposition: A | Discharge status: Home / Self Care | Payer: Self-pay | Source: Home / Self Care | Attending: Thoracic Surgery (Cardiothoracic Vascular Surgery)

## 2018-08-26 ENCOUNTER — Inpatient Hospital Stay (HOSPITAL_COMMUNITY): Payer: PPO

## 2018-08-26 ENCOUNTER — Other Ambulatory Visit (HOSPITAL_COMMUNITY): Payer: Self-pay | Admitting: Respiratory Therapy

## 2018-08-26 ENCOUNTER — Encounter (HOSPITAL_COMMUNITY): Payer: Self-pay | Admitting: Cardiovascular Disease

## 2018-08-26 ENCOUNTER — Encounter (HOSPITAL_COMMUNITY): Payer: PPO

## 2018-08-26 ENCOUNTER — Other Ambulatory Visit: Payer: Self-pay | Admitting: *Deleted

## 2018-08-26 DIAGNOSIS — I2510 Atherosclerotic heart disease of native coronary artery without angina pectoris: Secondary | ICD-10-CM

## 2018-08-26 DIAGNOSIS — I25110 Atherosclerotic heart disease of native coronary artery with unstable angina pectoris: Secondary | ICD-10-CM

## 2018-08-26 DIAGNOSIS — I10 Essential (primary) hypertension: Secondary | ICD-10-CM

## 2018-08-26 DIAGNOSIS — I214 Non-ST elevation (NSTEMI) myocardial infarction: Secondary | ICD-10-CM

## 2018-08-26 DIAGNOSIS — Z01810 Encounter for preprocedural cardiovascular examination: Secondary | ICD-10-CM

## 2018-08-26 HISTORY — PX: LEFT HEART CATH AND CORONARY ANGIOGRAPHY: CATH118249

## 2018-08-26 LAB — BASIC METABOLIC PANEL
Anion gap: 11 (ref 5–15)
BUN: 17 mg/dL (ref 8–23)
CO2: 25 mmol/L (ref 22–32)
Calcium: 9.2 mg/dL (ref 8.9–10.3)
Chloride: 102 mmol/L (ref 98–111)
Creatinine, Ser: 1.2 mg/dL (ref 0.61–1.24)
GFR calc Af Amer: >60 mL/min (ref >60)
GFR calc non Af Amer: 58 mL/min — ABNORMAL LOW (ref >60)
Glucose, Bld: 99 mg/dL (ref 70–99)
Potassium: 3 mmol/L — ABNORMAL LOW (ref 3.5–5.1)
Sodium: 138 mmol/L (ref 135–145)

## 2018-08-26 LAB — TROPONIN I: Troponin I: 1.71 ng/mL (ref <0.03)

## 2018-08-26 LAB — LIPID PANEL
Cholesterol: 160 mg/dL (ref 0–200)
HDL: 44 mg/dL (ref >40)
LDL Cholesterol: 107 mg/dL — ABNORMAL HIGH (ref 0–99)
Total CHOL/HDL Ratio: 3.6 RATIO
Triglycerides: 43 mg/dL (ref <150)
VLDL: 9 mg/dL (ref 0–40)

## 2018-08-26 LAB — ECHOCARDIOGRAM COMPLETE
Height: 63 in
Weight: 2198.4 oz

## 2018-08-26 LAB — CBC
HCT: 43 % (ref 39.0–52.0)
Hemoglobin: 14.8 g/dL (ref 13.0–17.0)
MCH: 31.2 pg (ref 26.0–34.0)
MCHC: 34.4 g/dL (ref 30.0–36.0)
MCV: 90.5 fL (ref 80.0–100.0)
Platelets: 271 10*3/uL (ref 150–400)
RBC: 4.75 MIL/uL (ref 4.22–5.81)
RDW: 11.8 % (ref 11.5–15.5)
WBC: 5.7 10*3/uL (ref 4.0–10.5)
nRBC: 0 % (ref 0.0–0.2)

## 2018-08-26 LAB — HEPARIN LEVEL (UNFRACTIONATED): Heparin Unfractionated: 0.54 IU/mL (ref 0.30–0.70)

## 2018-08-26 SURGERY — LEFT HEART CATH AND CORONARY ANGIOGRAPHY
Anesthesia: LOCAL

## 2018-08-26 MED ORDER — PLASMA-LYTE 148 IV SOLN
INTRAVENOUS | Status: AC
  Administered 2018-08-27: 08:00:00 500 mL
  Filled 2018-08-26: qty 2.5

## 2018-08-26 MED ORDER — TRANEXAMIC ACID 1000 MG/10ML IV SOLN
1.5000 mg/kg/h | INTRAVENOUS | Status: DC
  Filled 2018-08-26: qty 25

## 2018-08-26 MED ORDER — CHLORHEXIDINE GLUCONATE 4 % EX LIQD
60.0000 mL | Freq: Once | CUTANEOUS | Status: AC
  Administered 2018-08-26: 4 via TOPICAL
  Filled 2018-08-26: qty 60

## 2018-08-26 MED ORDER — ATORVASTATIN CALCIUM 80 MG PO TABS
80.0000 mg | ORAL_TABLET | Freq: Every day | ORAL | Status: DC
  Administered 2018-08-26: 80 mg via ORAL
  Filled 2018-08-26: qty 1

## 2018-08-26 MED ORDER — SODIUM CHLORIDE 0.9 % IV SOLN
INTRAVENOUS | Status: DC
  Filled 2018-08-26: qty 30

## 2018-08-26 MED ORDER — SODIUM CHLORIDE 0.9% FLUSH: 3.0000 mL | INTRAVENOUS | Status: DC | PRN

## 2018-08-26 MED ORDER — HEPARIN (PORCINE) 25000 UT/250ML-% IV SOLN
700.0000 [IU]/h | INTRAVENOUS | Status: DC
  Administered 2018-08-26: 700 [IU]/h via INTRAVENOUS

## 2018-08-26 MED ORDER — METOPROLOL TARTRATE 12.5 MG HALF TABLET
12.5000 mg | ORAL_TABLET | Freq: Once | ORAL | Status: DC
  Filled 2018-08-26: qty 1

## 2018-08-26 MED ORDER — TRANEXAMIC ACID (OHS) PUMP PRIME SOLUTION
2.0000 mg/kg | INTRAVENOUS | Status: DC
  Filled 2018-08-26: qty 1.25

## 2018-08-26 MED ORDER — IOHEXOL 350 MG/ML SOLN
INTRAVENOUS | Status: DC | PRN
  Administered 2018-08-26: 60 mL via INTRA_ARTERIAL

## 2018-08-26 MED ORDER — HYDRALAZINE HCL 20 MG/ML IJ SOLN: 10.0000 mg | INTRAMUSCULAR | Status: AC | PRN

## 2018-08-26 MED ORDER — EPINEPHRINE PF 1 MG/ML IJ SOLN
0.0000 ug/min | INTRAVENOUS | Status: DC
  Filled 2018-08-26: qty 4

## 2018-08-26 MED ORDER — FENTANYL CITRATE (PF) 100 MCG/2ML IJ SOLN
INTRAMUSCULAR | Status: AC
  Filled 2018-08-26: qty 2

## 2018-08-26 MED ORDER — ASPIRIN 81 MG PO CHEW: 81.0000 mg | CHEWABLE_TABLET | Freq: Every day | ORAL | Status: DC

## 2018-08-26 MED ORDER — BISACODYL 5 MG PO TBEC
5.0000 mg | DELAYED_RELEASE_TABLET | Freq: Once | ORAL | Status: DC
  Filled 2018-08-26: qty 1

## 2018-08-26 MED ORDER — VERAPAMIL HCL 2.5 MG/ML IV SOLN
INTRAVENOUS | Status: DC | PRN
  Administered 2018-08-26: 09:00:00 10 mL via INTRA_ARTERIAL

## 2018-08-26 MED ORDER — VERAPAMIL HCL 2.5 MG/ML IV SOLN
INTRAVENOUS | Status: AC
  Filled 2018-08-26: qty 2

## 2018-08-26 MED ORDER — MIDAZOLAM HCL 2 MG/2ML IJ SOLN
INTRAMUSCULAR | Status: AC
  Filled 2018-08-26: qty 2

## 2018-08-26 MED ORDER — HEPARIN (PORCINE) IN NACL 1000-0.9 UT/500ML-% IV SOLN
INTRAVENOUS | Status: AC
  Filled 2018-08-26: qty 1000

## 2018-08-26 MED ORDER — POTASSIUM CHLORIDE 2 MEQ/ML IV SOLN
80.0000 meq | INTRAVENOUS | Status: DC
  Filled 2018-08-26: qty 40

## 2018-08-26 MED ORDER — SODIUM CHLORIDE 0.9% FLUSH
3.0000 mL | Freq: Two times a day (BID) | INTRAVENOUS | Status: DC
  Administered 2018-08-26: 3 mL via INTRAVENOUS

## 2018-08-26 MED ORDER — TRANEXAMIC ACID (OHS) BOLUS VIA INFUSION
15.0000 mg/kg | INTRAVENOUS | Status: AC
  Administered 2018-08-27: 934.5 mg via INTRAVENOUS
  Filled 2018-08-26: qty 935

## 2018-08-26 MED ORDER — PHENYLEPHRINE HCL-NACL 20-0.9 MG/250ML-% IV SOLN
30.0000 ug/min | INTRAVENOUS | Status: DC
  Filled 2018-08-26: qty 250

## 2018-08-26 MED ORDER — SODIUM CHLORIDE 0.9 % IV SOLN
1.5000 g | INTRAVENOUS | Status: AC
  Administered 2018-08-27: 1.5 g via INTRAVENOUS
  Filled 2018-08-26: qty 1.5

## 2018-08-26 MED ORDER — LIDOCAINE HCL (PF) 1 % IJ SOLN
INTRAMUSCULAR | Status: AC
  Filled 2018-08-26: qty 30

## 2018-08-26 MED ORDER — CHLORHEXIDINE GLUCONATE 4 % EX LIQD
60.0000 mL | Freq: Once | CUTANEOUS | Status: AC
  Administered 2018-08-27: 4 via TOPICAL
  Filled 2018-08-26: qty 60

## 2018-08-26 MED ORDER — MILRINONE LACTATE IN DEXTROSE 20-5 MG/100ML-% IV SOLN
0.3000 ug/kg/min | INTRAVENOUS | Status: DC
  Filled 2018-08-26: qty 100

## 2018-08-26 MED ORDER — HEPARIN (PORCINE) IN NACL 1000-0.9 UT/500ML-% IV SOLN
INTRAVENOUS | Status: DC | PRN
  Administered 2018-08-26: 500 mL

## 2018-08-26 MED ORDER — ONDANSETRON HCL 4 MG/2ML IJ SOLN: 4.0000 mg | Freq: Four times a day (QID) | INTRAMUSCULAR | Status: DC | PRN

## 2018-08-26 MED ORDER — FENTANYL CITRATE (PF) 100 MCG/2ML IJ SOLN
INTRAMUSCULAR | Status: DC | PRN
  Administered 2018-08-26: 25 ug via INTRAVENOUS

## 2018-08-26 MED ORDER — LABETALOL HCL 5 MG/ML IV SOLN: 10.0000 mg | INTRAVENOUS | Status: AC | PRN

## 2018-08-26 MED ORDER — SODIUM CHLORIDE 0.9 % IV SOLN
750.0000 mg | INTRAVENOUS | Status: DC
  Filled 2018-08-26: qty 750

## 2018-08-26 MED ORDER — ACETAMINOPHEN 325 MG PO TABS: 650.0000 mg | ORAL_TABLET | ORAL | Status: DC | PRN

## 2018-08-26 MED ORDER — NOREPINEPHRINE 4 MG/250ML-% IV SOLN
0.0000 ug/min | INTRAVENOUS | Status: DC
  Filled 2018-08-26: qty 250

## 2018-08-26 MED ORDER — LIDOCAINE HCL (PF) 1 % IJ SOLN
INTRAMUSCULAR | Status: DC | PRN
  Administered 2018-08-26: 2 mL

## 2018-08-26 MED ORDER — MAGNESIUM SULFATE 50 % IJ SOLN
40.0000 meq | INTRAMUSCULAR | Status: DC
  Filled 2018-08-26: qty 9.85

## 2018-08-26 MED ORDER — MIDAZOLAM HCL 2 MG/2ML IJ SOLN
INTRAMUSCULAR | Status: DC | PRN
  Administered 2018-08-26 (×2): 1 mg via INTRAVENOUS

## 2018-08-26 MED ORDER — SODIUM CHLORIDE 0.9 % IV SOLN: 250.0000 mL | INTRAVENOUS | Status: DC | PRN

## 2018-08-26 MED ORDER — VANCOMYCIN HCL 1000 MG IV SOLR
INTRAVENOUS | Status: AC
  Administered 2018-08-27: 09:00:00 1000 mL
  Filled 2018-08-26: qty 1000

## 2018-08-26 MED ORDER — HEPARIN SODIUM (PORCINE) 1000 UNIT/ML IJ SOLN
INTRAMUSCULAR | Status: DC | PRN
  Administered 2018-08-26: 3000 [IU] via INTRAVENOUS

## 2018-08-26 MED ORDER — DEXMEDETOMIDINE HCL IN NACL 400 MCG/100ML IV SOLN
0.1000 ug/kg/h | INTRAVENOUS | Status: DC
  Filled 2018-08-26: qty 100

## 2018-08-26 MED ORDER — TEMAZEPAM 15 MG PO CAPS
15.0000 mg | ORAL_CAPSULE | Freq: Once | ORAL | Status: AC | PRN
  Administered 2018-08-26: 15 mg via ORAL
  Filled 2018-08-26: qty 1

## 2018-08-26 MED ORDER — CHLORHEXIDINE GLUCONATE 0.12 % MT SOLN
15.0000 mL | Freq: Once | OROMUCOSAL | Status: AC
  Administered 2018-08-27: 15 mL via OROMUCOSAL
  Filled 2018-08-26: qty 15

## 2018-08-26 MED ORDER — INSULIN REGULAR(HUMAN) IN NACL 100-0.9 UT/100ML-% IV SOLN
INTRAVENOUS | Status: AC
  Administered 2018-08-27: 1 [IU]/h via INTRAVENOUS
  Filled 2018-08-26: qty 100

## 2018-08-26 MED ORDER — SODIUM CHLORIDE 0.9 % IV SOLN
INTRAVENOUS | Status: DC
  Administered 2018-08-26 – 2018-08-27 (×2): via INTRAVENOUS

## 2018-08-26 MED ORDER — VANCOMYCIN HCL 10 G IV SOLR
1250.0000 mg | INTRAVENOUS | Status: AC
  Administered 2018-08-27: 1250 mg via INTRAVENOUS
  Filled 2018-08-26: qty 1250

## 2018-08-26 MED ORDER — DOPAMINE-DEXTROSE 3.2-5 MG/ML-% IV SOLN
0.0000 ug/kg/min | INTRAVENOUS | Status: DC
  Filled 2018-08-26: qty 250

## 2018-08-26 MED ORDER — NITROGLYCERIN IN D5W 200-5 MCG/ML-% IV SOLN
2.0000 ug/min | INTRAVENOUS | Status: AC
  Administered 2018-08-27: 16.6 ug/min via INTRAVENOUS
  Filled 2018-08-26: qty 250

## 2018-08-26 MED ORDER — DIAZEPAM 5 MG PO TABS: 5.0000 mg | ORAL_TABLET | Freq: Four times a day (QID) | ORAL | Status: DC | PRN

## 2018-08-26 MED ORDER — ALUM & MAG HYDROXIDE-SIMETH 200-200-20 MG/5ML PO SUSP
30.0000 mL | Freq: Once | ORAL | Status: AC
  Administered 2018-08-26: 30 mL via ORAL
  Filled 2018-08-26: qty 30

## 2018-08-26 SURGICAL SUPPLY — 12 items
CATH INFINITI 5FR ANG PIGTAIL (CATHETERS) ×1 IMPLANT
CATH OPTITORQUE TIG 4.0 5F (CATHETERS) ×1 IMPLANT
DEVICE RAD COMP TR BAND LRG (VASCULAR PRODUCTS) ×1 IMPLANT
GLIDESHEATH SLEND SS 6F .021 (SHEATH) ×1 IMPLANT
GUIDEWIRE INQWIRE 1.5J.035X260 (WIRE) IMPLANT
INQWIRE 1.5J .035X260CM (WIRE) ×2
KIT HEART LEFT (KITS) ×2 IMPLANT
PACK CARDIAC CATHETERIZATION (CUSTOM PROCEDURE TRAY) ×2 IMPLANT
SYR MEDRAD MARK 7 150ML (SYRINGE) ×2 IMPLANT
TRANSDUCER W/STOPCOCK (MISCELLANEOUS) ×2 IMPLANT
TUBING CIL FLEX 10 FLL-RA (TUBING) ×2 IMPLANT
WIRE HI TORQ VERSACORE-J 145CM (WIRE) ×1 IMPLANT

## 2018-08-26 NOTE — Progress Notes (Signed)
Pre-CABG testing has been completed. Preliminary results can be found in CV Proc through chart review.   08/26/18 2:14 PM Zachary [name] RVT

## 2018-08-26 NOTE — Interval H&P Note (Signed)
Cath Lab Visit (complete for each Cath Lab visit)  Clinical Evaluation Leading to the Procedure:   ACS: Yes.    Non-ACS:    Anginal Classification: CCS IV  Anti-ischemic medical therapy: Minimal Therapy (1 class of medications)  Non-Invasive Test Results: No non-invasive testing performed  Prior CABG: No previous CABG      History and Physical Interval Note:  08/26/2018 9:12 AM  Zachary [name]  has presented today for surgery, with the diagnosis of non stemi.  The various methods of treatment have been discussed with the patient and family. After consideration of risks, benefits and other options for treatment, the patient has consented to  Procedure(s): LEFT HEART CATH AND CORONARY ANGIOGRAPHY (N/A) as a surgical intervention.  The patient's history has been reviewed, patient examined, no change in status, stable for surgery.  I have reviewed the patient's chart and labs.  Questions were answered to the patient's satisfaction.     Shelva Majestic

## 2018-08-26 NOTE — Consult Note (Addendum)
[address]       [place] [address]             [phone]        Zachary [name] Medical Record #[id number] Date of Birth: 12/03/1940  Referring: Troy Sine, MD Primary Care: Josetta Huddle, MD Primary Cardiologist:Berry, Roderic Palau, MD Chief Complaint:    Chief Complaint  Patient presents with  . Chest Pain    History of Present Illness:     Zachary [name] is a very pleasant 78 year old male with prior history of hypertension and back problems requiring surgery for which he is fully recovered.  He was a remote tobacco smoker but has not smoked in about 15 years.  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  Symptoms subsided returned and we intensified and 2 days prior to this admission.  He presented to the emergency room here at [facility] on 08/05/2018 complaining of central chest pain radiating to both arms for about 2 days duration.  Work-up in the emergency room included an EKG that showed sinus bradycardia with a right bundle branch block.  Initial troponin was elevated at greater than 1 and peaked at 1.7.  Chest x-ray showed mild bibasilar atelectasis.  He was admitted to the cardiology service.  And to the Cath Lab earlier today where left heart catheterization demonstrated a 70% distal left main stenosis and three-vessel coronary artery disease.  Ejection fraction was estimated at 50% with small area of apical akinesis. He is currently resting comfortably with no chest pain or shortness of breath. 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  He remains active with maintaining rental properties and other various outdoor activities.  He maintains his exercise routine with walking up to an hour a day, squats, and leg lifts.  Current Activity/ Functional Status: Patient is independent with mobility/ambulation, transfers, ADL's, IADL's.   Zubrod Score: At the time of  surgery this patient's most appropriate activity status/level should be described as: []     0    Normal activity, no symptoms [x]     1    Restricted in physical strenuous activity but ambulatory, able to do out light work []     2    Ambulatory and capable of self care, unable to do work activities, up and about                 more than 50%  Of the time                            []     3    Only limited self care, in bed greater than 50% of waking hours []     4    Completely disabled, no self care, confined to bed or chair []     5    Moribund  Past Medical History:  Diagnosis Date  . Hypertension     Past Surgical History:  Procedure Laterality Date  . CHONDROPLASTY  06/25/2014   Procedure: CHONDROPLASTY;  Surgeon: Melrose Nakayama, MD;  Location: [facility];  Service: Orthopedics;;  . Growth removed from nose     age 49 yrs.  Marland Kitchen KNEE ARTHROSCOPY WITH MEDIAL MENISECTOMY Left 06/25/2014   Procedure: LEFT ARTHROSCOPY KNEE,partial medial menisectomy;  Surgeon: Melrose Nakayama, MD;  Location: [facility];  Service: Orthopedics;  Laterality: Left;  . LEFT HEART CATH AND CORONARY ANGIOGRAPHY N/A 08/26/2018   Procedure: LEFT HEART CATH AND CORONARY ANGIOGRAPHY;  Surgeon: Troy Sine, MD;  Location: [facility];  Service: Cardiovascular;  Laterality: N/A;  . Lower back surgery     Pinched nerve - 2010  . Upper back surgery     Plate and pins    Social History   Tobacco Use  Smoking Status Former Smoker  . Types: Cigarettes  Tobacco Comment   Quit smoking 50 years ago    Social History   Substance and Sexual Activity  Alcohol Use No     No Known Allergies  Current Facility-Administered Medications  Medication Dose Route Frequency Provider Last Rate Last Dose  . 0.9 %  sodium chloride infusion   Intravenous Continuous Troy Sine, MD 100 mL/hr at 08/26/18 1047    . 0.9 %  sodium chloride infusion  250 mL Intravenous PRN Troy Sine, MD       . acetaminophen (TYLENOL) tablet 650 mg  650 mg Oral Q4H PRN Troy Sine, MD      . amLODipine (NORVASC) tablet 10 mg  10 mg Oral Daily Bhagat, Bhavinkumar, PA   10 mg at 08/26/18 1049  . [START ON 08/27/2018] aspirin chewable tablet 81 mg  81 mg Oral Daily Troy Sine, MD      . atorvastatin (LIPITOR) tablet 80 mg  80 mg Oral q1800 Troy Sine, MD      . chlorthalidone (HYGROTON) tablet 25 mg  25 mg Oral Daily Bhagat, Bhavinkumar, PA   25 mg at 08/26/18 1102  . diazepam (VALIUM) tablet 5 mg  5 mg Oral Q6H PRN Troy Sine, MD      . heparin ADULT infusion 100 units/mL (25000 units/268mL sodium chloride 0.45%)  700 Units/hr Intravenous Continuous Lorretta Harp, MD      . hydrALAZINE (APRESOLINE) injection 10 mg  10 mg Intravenous Q20 Min PRN Troy Sine, MD      . labetalol (NORMODYNE) injection 10 mg  10 mg Intravenous Q10 min PRN Troy Sine, MD      . losartan (COZAAR) tablet 100 mg  100 mg Oral Daily Bhagat, Bhavinkumar, PA   100 mg at 08/26/18 1049  . multivitamin with minerals tablet 1 tablet  1 tablet Oral Daily Bhagat, Bhavinkumar, PA   1 tablet at 08/26/18 1049  . nitroGLYCERIN (NITROGLYN) 2 % ointment 1 inch  1 inch Topical Q6H Bhagat, Bhavinkumar, PA   1 inch at 08/25/18 2315  . nitroGLYCERIN (NITROSTAT) SL tablet 0.4 mg  0.4 mg Sublingual Q5 Min x 3 PRN Bhagat, Bhavinkumar, PA      . ondansetron (ZOFRAN) injection 4 mg  4 mg Intravenous Q6H PRN Troy Sine, MD      . sodium chloride flush (NS) 0.9 % injection 3 mL  3 mL Intravenous Q12H Bhagat, Bhavinkumar, PA      . sodium chloride flush (NS) 0.9 % injection 3 mL  3 mL Intravenous Q12H Shelva Majestic A, MD      . sodium chloride flush (NS) 0.9 % injection 3 mL  3 mL Intravenous PRN Troy Sine, MD        Medications Prior to Admission  Medication Sig Dispense Refill Last Dose  . acetaminophen (TYLENOL) 500 MG tablet Take 500 mg by mouth every 6 (six) hours as needed for headache (pain).     08/24/2018 at midnight  .  amLODipine (NORVASC) 10 MG tablet Take 10 mg by mouth daily.   08/25/2018 at am  . aspirin EC 81 MG tablet Take 81 mg by mouth every other day.   08/24/2018 at midnight  . chlorthalidone (HYGROTON) 25 MG tablet Take 25 mg by mouth daily.   08/25/2018 at am  . hydrocortisone cream 1 % Apply 1 application topically 3 (three) times daily as needed (itchy feet).   couple days ago  . losartan (COZAAR) 100 MG tablet Take 100 mg by mouth daily.   08/25/2018 at am  . Multiple Vitamin (MULTIVITAMIN WITH MINERALS) TABS tablet Take 1 tablet by mouth daily.   08/25/2018 at am  . OVER THE COUNTER MEDICATION Place 1 drop into both eyes daily as needed (itching/irritation/dry eyes). Over the counter lubricating eye drop   few days ago    Famliy History:  Patient reports "heart disease" in his father and mother.  Father died suddenly at age 65.  Unable to provide any other information   Review of Systems:   ROS      Cardiac Review of Systems: Y or  [    ]= no  Chest Pain [ y   ]  Resting SOB [   ] Exertional SOB  [  ]  Orthopnea [  ]   Pedal Edema [   ]    Palpitations [  ] Syncope  [  ]   Presyncope [   ]  General Review of Systems: [Y] = yes [  ]=no Constitional: recent weight change [  ]; anorexia [  ]; fatigue [  ]; nausea [  ]; night sweats [  ]; fever [  ]; or chills [  ]                                                               Dental: Last Dentist visit:   Eye : blurred vision [  ]; diplopia [   ]; vision changes [  ];  Amaurosis fugax[  ]; Resp: cough [  ];  wheezing[  ];  hemoptysis[  ]; shortness of breath[  ]; paroxysmal nocturnal dyspnea[  ]; dyspnea on exertion[  ]; or orthopnea[  ];  GI:  gallstones[  ], vomiting[  ];  dysphagia[  ]; melena[  ];  hematochezia [  ]; heartburn[  ];   Hx of  Colonoscopy[  ]; GU: kidney stones [  ]; hematuria[  ];   dysuria [  ];  nocturia[  ];  history of     obstruction [  ]; urinary frequency [  ]             Skin: rash, swelling[   ];, hair loss[  ];  peripheral edema[  ];  or itching[  ]; Musculosketetal: myalgias[  ];  joint swelling[  ];  joint erythema[  ];  joint pain[  ];  back pain[  ];  Heme/Lymph: bruising[  ];  bleeding[  ];  anemia[  ];  Neuro: TIA[  ];  headaches[  ];  stroke[  ];  vertigo[  ];  seizures[  ];   paresthesias[  ];  difficulty walking[  ];  Psych:depression[  ]; anxiety[  ];  Endocrine: diabetes[  ];  thyroid dysfunction[  ];  Physical Exam: BP 102/89   Pulse 63   Temp 98.1 F (36.7 C) (Oral)   Resp 14   Ht 5\' 3"  (1.6 m)   Wt 62.3 kg Comment: scale a  SpO2 97%   BMI 24.34 kg/m    General appearance: alert, cooperative and no distress Head: Normocephalic, without obvious abnormality, atraumatic Neck: no adenopathy, no carotid bruit, no JVD, supple, symmetrical, trachea midline and thyroid not enlarged, symmetric, no tenderness/mass/nodules Lymph nodes: Cervical, supraclavicular, and axillary nodes normal. Resp: clear to auscultation bilaterally Cardio: Regular rhythm, HR 55-60/min. No murmur GI: soft, non-tender; bowel sounds normal; no masses,  no organomegaly Extremities: extremities normal, atraumatic, no cyanosis or edema. He has some superficial varicosities in both lower legs.  There is a TR band positioned over the right radial artery following access for left heart catheterization.  The site is dry.  Peripheral pulses are otherwise intact and easily palpable. Neurologic: Grossly normal  Diagnostic Studies & Laboratory data:     Recent Radiology Findings:   Dg Chest 2 View  Result Date: 08/25/2018 CLINICAL DATA:  Chest pain. EXAM: CHEST - 2 VIEW COMPARISON:  07/06/2008. FINDINGS: Mediastinum and hilar structures normal. Low lung volumes with mild bibasilar atelectasis. No pleural effusion or pneumothorax. Heart size normal. Degenerative change thoracic spine. IMPRESSION: No acute abnormality. Electronically Signed   By: Marcello Moores  Register   On: 08/25/2018 11:59      I have independently reviewed the above radiologic studies and discussed with the patient   Recent Lab Findings:  LEFT HEART CATH AND CORONARY ANGIOGRAPHY  Conclusion    Ost RCA to Prox RCA lesion is 70% stenosed.  Prox RCA lesion is 60% stenosed.  Mid LM to Ost LAD lesion is 70% stenosed.  Ost Cx to Prox Cx lesion is 65% stenosed.  1st Diag lesion is 90% stenosed.  Dist LAD lesion is 90% stenosed.  RV Branch-1 lesion is 80% stenosed.  RV Branch-2 lesion is 95% stenosed.  Prox Cx to Mid Cx lesion is 50% stenosed.  2nd Mrg lesion is 85% stenosed.  Mid RCA to Dist RCA lesion is 99% stenosed.  There is mild left ventricular systolic dysfunction.  LV end diastolic pressure is low.    Coronary Findings  Diagnostic Dominance: Right Left Main  Mid LM to Ost LAD lesion 70% stenosed  Mid LM to Ost LAD lesion is 70% stenosed.  Left Anterior Descending  Dist LAD lesion 90% stenosed  Dist LAD lesion is 90% stenosed.  First Diagonal Branch  1st Diag lesion 90% stenosed  1st Diag lesion is 90% stenosed.  Left Circumflex  Ost Cx to Prox Cx lesion 65% stenosed  Ost Cx to Prox Cx lesion is 65% stenosed.  Prox Cx to Mid Cx lesion 50% stenosed  Prox Cx to Mid Cx lesion is 50% stenosed.  First Obtuse Marginal Branch  Vessel is small in size.  Second Obtuse Marginal Branch  2nd Mrg lesion 85% stenosed  2nd Mrg lesion is 85% stenosed.  Right Coronary Artery  Ost RCA to Prox RCA lesion 70% stenosed  Ost RCA to Prox RCA lesion is 70% stenosed.  Prox RCA lesion 60% stenosed  Prox RCA lesion is 60% stenosed.  Mid RCA to Dist RCA lesion 99% stenosed  Mid RCA to Dist RCA lesion is 99% stenosed.  Right Ventricular Branch  RV Branch-1 lesion 80% stenosed  RV Branch-1 lesion is 80% stenosed.  RV Branch-2 lesion 95% stenosed  RV Branch-2 lesion is 95% stenosed.  Third Right Posterolateral Branch  Collaterals  3rd RPL filled by collaterals from 3rd Mrg.     Intervention  No interventions have been documented. Wall Motion  Resting               Left Heart  Left Ventricle The left ventricular size is normal. There is mild left ventricular systolic dysfunction. LV end diastolic pressure is low. There is mild LV dysfunction with EF approximately at 50%.  There is a small focal area of apical inferior akinesis.  LVEDP is 3 mm Hg  Coronary Diagrams  Diagnostic Dominance: Right   Severe multivessel CAD with coronary calcification and 70% distal left main stenosis; 70% ostial LAD stenosis with a proximal bifurcating diagonal vessel with 80% stenosis in the superior branch and 90% stenosis in the diagonal as well as distal focal 90% LAD stenosis; 60% ostial circumflex stenosis with 50% stenosis before moderate size marginal branch with diffuse 85% stenosis; calcified RCA with 70% ostial stenosis followed by 60% proximal and 80% ostial marginal with 95% distal marginal stenosis with thrombus and subtotal Lee occluded mid RCA after the takeoff of this marginal branch.  There is TIMI I flow down the distal RCA.  There is faint distal collateralization from the left circulation to the distal vessel.  Mild LV dysfunction with global EF at 50% and a small focal region of inferoapical akinesis.  LVEDP 3 mmHg.  RECOMMENDATION: Surgical consultation for CABG revascularization.  Heparin will be restarted 8 hours post radial sheath removal.  The patient's wife was called and notified of the catheterization results.           Assessment / Plan:      Very pleasant 78 year old male with a past medical history significant for hypertension admitted through the emergency room with an acute non-ST elevation myocardial infarction.  Left heart catheterization demonstrates multivessel coronary disease and significant left main coronary artery stenosis.  Coronary bypass grafting is his best option for revascularization.  I discussed the nature of the  procedure with him and answered his questions regarding surgery and post-op recovery.   He is eager to proceed with surgery as soon as possible.  We will continue with preoperative work-up to include an echocardiogram and vascular studies.  We will tentatively plan for CABG on Wednesday, 08/27/2018.   I  spent 25 minutes counseling the patient face to face.   Antony Odea, PA-C  08/26/2018 11:49 AM   I have seen and examined the patient and agree with the assessment and plan as outlined above by Enid Cutter, PA-C.  Patient is a 78 year old male with history of hypertension but no previous history of coronary artery disease who presents with symptoms of unstable angina pectoris.  Baseline EKG reveals sinus rhythm with right bundle branch block and no obvious significant ST segment elevation.  Troponin levels were weakly positive, consistent with non-ST segment elevation myocardial infarction.  He has remained pain-free and clinically stable since hospitalization.  I have personally reviewed the patient's diagnostic cardiac catheterization and transthoracic echocardiogram.  Catheterization reveals severe left main and three-vessel coronary artery disease with preserved left ventricular systolic function.  Transthoracic echocardiogram reveals normal left ventricular function with no significant valvular heart disease.  I agree the patient would best be treated with surgical revascularization.  I have reviewed the indications, risks, and potential benefits of coronary artery bypass grafting with the patient at the bedside this afternoon.  Alternative treatment strategies have been discussed, including the relative risks, benefits  and long term prognosis associated with medical therapy, percutaneous coronary intervention, and surgical revascularization.  Expectations for the patient's postoperative convalescence have been discussed.  The patient understands and accepts all potential  associated risks of surgery including but not limited to risk of death, stroke or other neurologic complication, myocardial infarction, congestive heart failure, respiratory failure, renal failure, bleeding requiring blood transfusion and/or reexploration, aortic dissection or other major vascular complication, arrhythmia, heart block or bradycardia requiring permanent pacemaker, pneumonia, pleural effusion, wound infection, pulmonary embolus or other thromboembolic complication, chronic pain or other delayed complications related to median sternotomy, or the late recurrence of symptomatic ischemic heart disease and/or congestive heart failure.  The importance of long term risk modification have been emphasized.  All questions answered.   I spent in excess of 60 minutes during the conduct of this hospital encounter and >50% of this time involved direct face-to-face encounter with the patient for counseling and/or coordination of their care.     [name], MD 08/26/2018 5:51 PM

## 2018-08-26 NOTE — Progress Notes (Signed)
ANTICOAGULATION CONSULT NOTE  Pharmacy Consult for Heparin  Indication: chest pain/ACS  No Known Allergies  Patient Measurements: HEPARIN DW (KG): 62.4  Vital Signs: Temp: 98.1 F (36.7 C) (06/23 0747) Temp Source: Oral (06/23 0747) BP: 125/73 (06/23 1017) Pulse Rate: 63 (06/23 0945)  Labs: Recent Labs    08/25/18 1135 08/25/18 1839 08/25/18 2258 08/26/18 0600 08/26/18 0809  HGB 15.8  --   --  14.8  --   HCT 46.5  --   --  43.0  --   PLT 313  --   --  271  --   HEPARINUNFRC  --   --  1.10*  --  0.54  CREATININE 1.24  --   --  1.20  --   TROPONINI 0.66* 1.01* 1.03* 1.71*  --     Estimated Creatinine Clearance: 40.8 mL/min (by C-G formula based on SCr of 1.2 mg/dL).   Medical History: Past Medical History:  Diagnosis Date  . Hypertension    Assessment: Zachary [name] is a 78yo male admitted with 1 week hx of waxing/waning chest pain/pressure. Admit with NSTEMI; troponins elevated at 0.66. Pharmacy consulted to start heparin. No anticoagulants PTA. Hgb 15.8 and pltc 313.  S/p cath, heparin to resume 8 hours post sheath removal  Goal of Therapy:  Heparin level 0.3-0.7 units/ml Monitor platelets by anticoagulation protocol: Yes   Plan:  Heparin to restart at 1800 pm at 700 units / hr Monitor daily heparin level, CBC, and s/sx of bleeding  Thank you Anette Guarneri, PharmD [phone]

## 2018-08-26 NOTE — Progress Notes (Signed)
  Echocardiogram 2D Echocardiogram has been performed.  Darlina Sicilian M 08/26/2018, 3:15 PM

## 2018-08-26 NOTE — Progress Notes (Signed)
[facility] consulted for CABG evaluation. °

## 2018-08-26 NOTE — Progress Notes (Signed)
Progress Note  Patient Name: Zachary [name] Date of Encounter: 08/26/2018  Primary Cardiologist: Dr. Quay [name]  Subjective   Zachary [name] had left heart cath this morning by Zachary [name] revealing left main/three-vessel disease with preserved LV function.  He did have an episode of chest pain after that relieved with topical nitrates and Maalox.  Scheduled for CABG tomorrow  Inpatient Medications    Scheduled Meds:  amLODipine  10 mg Oral Daily   [START ON 08/27/2018] aspirin  81 mg Oral Daily   atorvastatin  80 mg Oral q1800   chlorthalidone  25 mg Oral Daily   losartan  100 mg Oral Daily   multivitamin with minerals  1 tablet Oral Daily   nitroGLYCERIN  1 inch Topical Q6H   sodium chloride flush  3 mL Intravenous Q12H   sodium chloride flush  3 mL Intravenous Q12H   Continuous Infusions:  sodium chloride 100 mL/hr at 08/26/18 1047   sodium chloride     heparin     PRN Meds: sodium chloride, acetaminophen, diazepam, hydrALAZINE, labetalol, nitroGLYCERIN, ondansetron (ZOFRAN) IV, sodium chloride flush   Vital Signs    Vitals:   08/26/18 1048 08/26/18 1108 08/26/18 1200 08/26/18 1223  BP: 125/69 102/89 129/77 (!) 169/69  Pulse:      Resp:      Temp:      TempSrc:      SpO2:      Weight:      Height:        Intake/Output Summary (Last 24 hours) at 08/26/2018 1317 Last data filed at 08/26/2018 0749 Gross per 24 hour  Intake 240 ml  Output 1050 ml  Net -810 ml   Last 3 Weights 08/26/2018 08/25/2018 08/25/2018  Weight (lbs) 137 lb 6.4 oz 137 lb 9.6 oz 142 lb  Weight (kg) 62.324 kg 62.415 kg 64.411 kg      Telemetry    Sinus rhythm- Personally Reviewed  ECG    Normal sinus rhythm with right bundle branch block- Personally Reviewed  Physical Exam   GEN: No acute distress.   Neck: No JVD Cardiac: RRR, no murmurs, rubs, or gallops.  Respiratory: Clear to auscultation bilaterally. GI: Soft, nontender, non-distended  MS: No edema; No  deformity. Neuro:  Nonfocal  Psych: Normal affect   Labs    High Sensitivity Troponin:  No results for input(s): TROPONINIHS in the last 720 hours.    Cardiac Enzymes Recent Labs  Lab 08/25/18 1135 08/25/18 1839 08/25/18 2258 08/26/18 0600  TROPONINI 0.66* 1.01* 1.03* 1.71*   No results for input(s): TROPIPOC in the last 168 hours.   Chemistry Recent Labs  Lab 08/25/18 1135 08/26/18 0600  NA 140 138  K 3.6 3.0*  CL 100 102  CO2 30 25  GLUCOSE 110* 99  BUN 17 17  CREATININE 1.24 1.20  CALCIUM 9.7 9.2  GFRNONAA 55* 58*  GFRAA >60 >60  ANIONGAP 10 11     Hematology Recent Labs  Lab 08/25/18 1135 08/26/18 0600  WBC 7.4 5.7  RBC 5.01 4.75  HGB 15.8 14.8  HCT 46.5 43.0  MCV 92.8 90.5  MCH 31.5 31.2  MCHC 34.0 34.4  RDW 11.9 11.8  PLT 313 271    BNPNo results for input(s): BNP, PROBNP in the last 168 hours.   DDimer No results for input(s): DDIMER in the last 168 hours.   Radiology    Dg Chest 2 View  Result Date: 08/25/2018 CLINICAL DATA:  Chest pain.  EXAM: CHEST - 2 VIEW COMPARISON:  07/06/2008. FINDINGS: Mediastinum and hilar structures normal. Low lung volumes with mild bibasilar atelectasis. No pleural effusion or pneumothorax. Heart size normal. Degenerative change thoracic spine. IMPRESSION: No acute abnormality. Electronically Signed   By: Galva   On: 08/25/2018 11:59    Cardiac Studies   Cardiac catheterization (08/26/2018)  [facility] CARDIAC CATHETERIZATION Order# [id number] Reading physician: Troy Sine, MD Ordering physician: Troy Sine, MD Study date: 08/26/18  Physicians  Panel Physicians Referring Physician Case Authorizing Physician  Troy Sine, MD (Primary)    Procedures  LEFT HEART CATH AND CORONARY ANGIOGRAPHY  Conclusion    Ost RCA to Prox RCA lesion is 70% stenosed.  Prox RCA lesion is 60% stenosed.  Mid LM to Ost LAD lesion is 70% stenosed.  Ost Cx to Prox Cx lesion is 65%  stenosed.  1st Diag lesion is 90% stenosed.  Dist LAD lesion is 90% stenosed.  RV Branch-1 lesion is 80% stenosed.  RV Branch-2 lesion is 95% stenosed.  Prox Cx to Mid Cx lesion is 50% stenosed.  2nd Mrg lesion is 85% stenosed.  Mid RCA to Dist RCA lesion is 99% stenosed.  There is mild left ventricular systolic dysfunction.  LV end diastolic pressure is low.   Severe multivessel CAD with coronary calcification and 70% distal left main stenosis; 70% ostial LAD stenosis with a proximal bifurcating diagonal vessel with 80% stenosis in the superior branch and 90% stenosis in the diagonal as well as distal focal 90% LAD stenosis; 60% ostial circumflex stenosis with 50% stenosis before moderate size marginal branch with diffuse 85% stenosis; calcified RCA with 70% ostial stenosis followed by 60% proximal and 80% ostial marginal with 95% distal marginal stenosis with thrombus and subtotal Zachary [name] occluded mid RCA after the takeoff of this marginal branch.  There is TIMI I flow down the distal RCA.  There is faint distal collateralization from the left circulation to the distal vessel.  Mild LV dysfunction with global EF at 50% and a small focal region of inferoapical akinesis.  LVEDP 3 mmHg.  RECOMMENDATION: Surgical consultation for CABG revascularization.  Heparin will be restarted 8 hours post radial sheath removal.  The patient's wife was called and notified of the catheterization results.   Recommendations  Antiplatelet/Anticoag Recommend Aspirin 81mg  daily for moderate CAD.  Indications  Non-ST elevation (NSTEMI) myocardial infarction ([place]) [I21.4 (ICD-10-CM)]  Procedural Details  Technical Details Zachary [name] is a 78 year old gentleman who has a history of hypertension and developed recurrent episodes of new onset chest pain leading to his hospitalization yesterday.  He has ruled in for a NSTEMI and presents for definitive cardiac catheterization.  The patient was brought to  the cardiac catheterization lab in the fasting state. The patient was premedicated with Versed 2 mg and fentanyl 25 mcg. A right radial approach was utilized after an Allen's test verified adequate circulation. The right radial artery was punctured via the Seldinger technique, and a 6 [place] Glidesheath Slender was inserted without difficulty.  A radial cocktail consisting of Verapamil 3 mg was administered. The patient received weight adjusted heparin. A safety J wire was advanced into the ascending aorta. Diagnostic catheterization was done with a 5 [place] TIG 4.0 catheter. A 5 French pigtail catheter was used for left ventriculography. A TR radial band was applied for hemostasis. The patient left the catheterization laboratory in stable condition.   Estimated blood loss <50 mL.   During this procedure medications  were administered to achieve and maintain moderate conscious sedation while the patient's heart rate, blood pressure, and oxygen saturation were continuously monitored and I was present face-to-face 100% of this time.  Medications (Filter: Administrations occurring from 08/26/18 0909 to 08/26/18 1000) (important)  Continuous medications are totaled by the amount administered until 08/26/18 1000.  Medication Rate/Dose/Volume Action  Date Time   Heparin (Porcine) in NaCl 1000-0.9 UT/500ML-% SOLN (mL) 500 mL Given 08/26/18 0911   Total dose as of 08/26/18 1000        500 mL        Heparin (Porcine) in NaCl 1000-0.9 UT/500ML-% SOLN (mL) 500 mL Given 08/26/18 0911   Total dose as of 08/26/18 1000        500 mL        midazolam (VERSED) injection (mg) 1 mg Given 08/26/18 0915   Total dose as of 08/26/18 1000 1 mg Given 0926   2 mg        fentaNYL (SUBLIMAZE) injection (mcg) 25 mcg Given 08/26/18 0916   Total dose as of 08/26/18 1000        25 mcg        lidocaine (PF) (XYLOCAINE) 1 % injection (mL) 2 mL Given 08/26/18 0926   Total dose as of 08/26/18 1000        2 mL        Radial  Cocktail/Verapamil only (mL) 10 mL Given 08/26/18 0927   Total dose as of 08/26/18 1000        10 mL        heparin injection (Units) 3,000 Units Given 08/26/18 0933   Total dose as of 08/26/18 1000        3,000 Units        iohexol (OMNIPAQUE) 350 MG/ML injection (mL) 60 mL Given 08/26/18 0948   Total dose as of 08/26/18 1000        60 mL        amLODipine (NORVASC) tablet 10 mg (mg) *Not included in total Automatically Held 08/26/18 1000   Dosing weight:  64.4 kg        Total dose as of 08/26/18 1000        Cannot be calculated        chlorthalidone (HYGROTON) tablet 25 mg (mg) *Not included in total Automatically Held 08/26/18 1000   Dosing weight:  64.4 kg        Total dose as of 08/26/18 1000        Cannot be calculated        losartan (COZAAR) tablet 100 mg (mg) *Not included in total Automatically Held 08/26/18 1000   Dosing weight:  64.4 kg        Total dose as of 08/26/18 1000        Cannot be calculated        multivitamin with minerals tablet 1 tablet (tablet) *Not included in total Automatically Held 08/26/18 1000   Dosing weight:  64.4 kg        Total dose as of 08/26/18 1000        Cannot be calculated        sodium chloride flush (NS) 0.9 % injection 3 mL (mL) *Not included in total Automatically Held 08/26/18 1000   Dosing weight:  64.4 kg        Total dose as of 08/26/18 1000        Cannot be calculated  Sedation Time  Sedation Time Physician-1: 30 minutes 29 seconds  Coronary Findings                                                                         Intervention  No interventions have been documented. Wall Motion  Resting               Left Heart  Left Ventricle The left ventricular size is normal. There is mild left ventricular systolic dysfunction. LV end diastolic pressure is low. There is mild LV dysfunction with EF approximately at 50%.  There is a small focal area of apical inferior akinesis.  LVEDP  is 3 mm Hg  Coronary Diagrams  Diagnostic Dominance: Right  Intervention  Implants   No implant documentation for this case.  Syngo Images  Show images for CARDIAC CATHETERIZATION  Images on Long Term Storage  Show images for [place], [place] to Procedure Log  Procedure Log    Hemo Data   Most Recent Value  AO Systolic Pressure 025 mmHg  AO Diastolic Pressure 52 mmHg  AO Mean 74 mmHg  LV Systolic Pressure 852 mmHg  LV Diastolic Pressure -1 mmHg  LV EDP 1 mmHg  AOp Systolic Pressure 778 mmHg  AOp Diastolic Pressure 53 mmHg  AOp Mean Pressure 79 mmHg  LVp Systolic Pressure 242 mmHg  LVp Diastolic Pressure 0 mmHg  LVp EDP Pressure 3 mmHg  Order-Level Documents - 08/25/2018:  Scan on 08/26/2018 10:04 AM by Default, Provider, MD     Encounter-Level Documents - 08/25/2018:  Scan on 08/26/2018 9:38 AM by Default, Provider, MD Electronic signature on 08/25/2018 2:02 PM - E-signed     Signed  Electronically signed by Troy Sine, MD on 08/26/18 at 1051 EDT  External Result Report  External Result Report    Patient Profile     78 y.o. male admitted yesterday with unstable angina.  His enzymes were mildly elevated.  He had right bundle branch block.  Cardiac catheterization performed this morning showed left main/three-vessel disease with preserved LV function.  Assessment & Plan    1: Coronary artery disease- enzymes minimally positive suggesting non-STEMI.  Cath this morning showed left main/three-vessel disease with preserved LV function.  Patient scheduled for CABG tomorrow  2: Essential hypertension- history of essential hypertension on losartan and amlodipine at home.  For questions or updates, please contact [place] Please consult [email or web address] for contact info under        Signed, Zachary Burow, MD  08/26/2018, 1:17 PM

## 2018-08-26 NOTE — Progress Notes (Signed)
[id number] Discussed with pt the importance of IS and mobility after surgery. Gave IS and pt demonstrated 750 ml correctly. Gave staying in the tube handout and discussed sternal precautions. Gave OHS booklet and care guide. Wrote down how to view pre op video. Pt stated his wife will be available after discharge to assist with his care. Will follow up after surgery. Pt voiced understanding of ed. Graylon Good RN BSN 08/26/2018 2:29 PM

## 2018-08-26 NOTE — Anesthesia Preprocedure Evaluation (Addendum)
Anesthesia Evaluation  Patient identified by MRN, date of birth, ID band Patient awake    Reviewed: Allergy & Precautions, H&P , NPO status , Patient's Chart, lab work & pertinent test results  Airway Mallampati: III  TM Distance: >3 FB Neck ROM: Full    Dental no notable dental hx. (+) Teeth Intact, Dental Advisory Given   Pulmonary neg pulmonary ROS, former smoker,    Pulmonary exam normal breath sounds clear to auscultation       Cardiovascular Exercise Tolerance: Good hypertension, Pt. on medications + CAD and + Past MI   Rhythm:Regular Rate:Normal     Neuro/Psych negative neurological ROS  negative psych ROS   GI/Hepatic negative GI ROS, Neg liver ROS,   Endo/Other  negative endocrine ROS  Renal/GU negative Renal ROS  negative genitourinary   Musculoskeletal   Abdominal   Peds  Hematology negative hematology ROS (+)   Anesthesia Other Findings   Reproductive/Obstetrics negative OB ROS                            Anesthesia Physical Anesthesia Plan  ASA: IV  Anesthesia Plan: General   Post-op Pain Management:    Induction: Intravenous  PONV Risk Score and Plan: 2 and Ondansetron and Midazolam  Airway Management Planned: Oral ETT  Additional Equipment: Arterial line, CVP, PA Cath, TEE and Ultrasound Guidance Line Placement  Intra-op Plan:   Post-operative Plan: Post-operative intubation/ventilation  Informed Consent: I have reviewed the patients History and Physical, chart, labs and discussed the procedure including the risks, benefits and alternatives for the proposed anesthesia with the patient or authorized representative who has indicated his/her understanding and acceptance.     Dental advisory given  Plan Discussed with: CRNA  Anesthesia Plan Comments:         Anesthesia Quick Evaluation

## 2018-08-26 NOTE — Progress Notes (Signed)
Patient TR band has been removed, site is level zero. Gauze and tegraderm applied to site.

## 2018-08-26 NOTE — Progress Notes (Signed)
RN attempted to remove 3cc of air from TR band, site started to bleed, 3cc of air put back in TRband, bleeding stop. Vitals stable. Will continue to monitor.

## 2018-08-27 ENCOUNTER — Inpatient Hospital Stay (HOSPITAL_COMMUNITY)
Admission: EM | Disposition: A | Discharge status: Home / Self Care | Payer: Self-pay | Source: Home / Self Care | Attending: Thoracic Surgery (Cardiothoracic Vascular Surgery)

## 2018-08-27 ENCOUNTER — Inpatient Hospital Stay (HOSPITAL_COMMUNITY): Payer: PPO | Admitting: Certified Registered Nurse Anesthetist

## 2018-08-27 ENCOUNTER — Inpatient Hospital Stay (HOSPITAL_COMMUNITY): Payer: PPO

## 2018-08-27 ENCOUNTER — Inpatient Hospital Stay (HOSPITAL_COMMUNITY): Payer: PPO | Admitting: Anesthesiology

## 2018-08-27 ENCOUNTER — Encounter (HOSPITAL_COMMUNITY): Payer: Self-pay | Admitting: Thoracic Surgery (Cardiothoracic Vascular Surgery)

## 2018-08-27 DIAGNOSIS — Z951 Presence of aortocoronary bypass graft: Secondary | ICD-10-CM

## 2018-08-27 DIAGNOSIS — I214 Non-ST elevation (NSTEMI) myocardial infarction: Secondary | ICD-10-CM | POA: Diagnosis not present

## 2018-08-27 DIAGNOSIS — I25110 Atherosclerotic heart disease of native coronary artery with unstable angina pectoris: Secondary | ICD-10-CM

## 2018-08-27 DIAGNOSIS — I10 Essential (primary) hypertension: Secondary | ICD-10-CM | POA: Diagnosis not present

## 2018-08-27 DIAGNOSIS — I9789 Other postprocedural complications and disorders of the circulatory system, not elsewhere classified: Secondary | ICD-10-CM

## 2018-08-27 DIAGNOSIS — I97611 Postprocedural hemorrhage and hematoma of a circulatory system organ or structure following cardiac bypass: Secondary | ICD-10-CM | POA: Diagnosis not present

## 2018-08-27 HISTORY — DX: Presence of aortocoronary bypass graft: Z95.1

## 2018-08-27 HISTORY — DX: Essential (primary) hypertension: I10

## 2018-08-27 HISTORY — PX: CORONARY ARTERY BYPASS GRAFT: SHX141

## 2018-08-27 HISTORY — PX: TEE WITHOUT CARDIOVERSION: SHX5443

## 2018-08-27 LAB — POCT I-STAT 4, (NA,K, GLUC, HGB,HCT)
Glucose, Bld: 105 mg/dL — ABNORMAL HIGH (ref 70–99)
Glucose, Bld: 108 mg/dL — ABNORMAL HIGH (ref 70–99)
Glucose, Bld: 108 mg/dL — ABNORMAL HIGH (ref 70–99)
Glucose, Bld: 110 mg/dL — ABNORMAL HIGH (ref 70–99)
Glucose, Bld: 102 mg/dL — ABNORMAL HIGH (ref 70–99)
Glucose, Bld: 140 mg/dL — ABNORMAL HIGH (ref 70–99)
HCT: 38 % — ABNORMAL LOW (ref 39.0–52.0)
HCT: 35 % — ABNORMAL LOW (ref 39.0–52.0)
HCT: 26 % — ABNORMAL LOW (ref 39.0–52.0)
HCT: 26 % — ABNORMAL LOW (ref 39.0–52.0)
HCT: 31 % — ABNORMAL LOW (ref 39.0–52.0)
HCT: 34 % — ABNORMAL LOW (ref 39.0–52.0)
Hemoglobin: 12.9 g/dL — ABNORMAL LOW (ref 13.0–17.0)
Hemoglobin: 11.9 g/dL — ABNORMAL LOW (ref 13.0–17.0)
Hemoglobin: 8.8 g/dL — ABNORMAL LOW (ref 13.0–17.0)
Hemoglobin: 8.8 g/dL — ABNORMAL LOW (ref 13.0–17.0)
Hemoglobin: 10.5 g/dL — ABNORMAL LOW (ref 13.0–17.0)
Hemoglobin: 11.6 g/dL — ABNORMAL LOW (ref 13.0–17.0)
Potassium: 4.2 mmol/L (ref 3.5–5.1)
Potassium: 3.1 mmol/L — ABNORMAL LOW (ref 3.5–5.1)
Potassium: 4 mmol/L (ref 3.5–5.1)
Potassium: 3.6 mmol/L (ref 3.5–5.1)
Potassium: 3.2 mmol/L — ABNORMAL LOW (ref 3.5–5.1)
Potassium: 3.4 mmol/L — ABNORMAL LOW (ref 3.5–5.1)
Sodium: 139 mmol/L (ref 135–145)
Sodium: 141 mmol/L (ref 135–145)
Sodium: 142 mmol/L (ref 135–145)
Sodium: 142 mmol/L (ref 135–145)
Sodium: 143 mmol/L (ref 135–145)
Sodium: 143 mmol/L (ref 135–145)

## 2018-08-27 LAB — POCT I-STAT 7, (LYTES, BLD GAS, ICA,H+H)
Acid-Base Excess: 1 mmol/L (ref 0.0–2.0)
Acid-base deficit: 4 mmol/L — ABNORMAL HIGH (ref 0.0–2.0)
Acid-base deficit: 4 mmol/L — ABNORMAL HIGH (ref 0.0–2.0)
Bicarbonate: 24.4 mmol/L (ref 20.0–28.0)
Bicarbonate: 22.1 mmol/L (ref 20.0–28.0)
Bicarbonate: 20.6 mmol/L (ref 20.0–28.0)
Calcium, Ion: 0.9 mmol/L — ABNORMAL LOW (ref 1.15–1.40)
Calcium, Ion: 1.12 mmol/L — ABNORMAL LOW (ref 1.15–1.40)
Calcium, Ion: 1.18 mmol/L (ref 1.15–1.40)
HCT: 24 % — ABNORMAL LOW (ref 39.0–52.0)
HCT: 31 % — ABNORMAL LOW (ref 39.0–52.0)
HCT: 33 % — ABNORMAL LOW (ref 39.0–52.0)
Hemoglobin: 8.2 g/dL — ABNORMAL LOW (ref 13.0–17.0)
Hemoglobin: 10.5 g/dL — ABNORMAL LOW (ref 13.0–17.0)
Hemoglobin: 11.2 g/dL — ABNORMAL LOW (ref 13.0–17.0)
O2 Saturation: 100 %
O2 Saturation: 97 %
O2 Saturation: 90 %
Patient temperature: 35.5
Patient temperature: 34.8
Potassium: 4.7 mmol/L (ref 3.5–5.1)
Potassium: 3.2 mmol/L — ABNORMAL LOW (ref 3.5–5.1)
Potassium: 3.4 mmol/L — ABNORMAL LOW (ref 3.5–5.1)
Sodium: 142 mmol/L (ref 135–145)
Sodium: 144 mmol/L (ref 135–145)
Sodium: 143 mmol/L (ref 135–145)
TCO2: 25 mmol/L (ref 22–32)
TCO2: 23 mmol/L (ref 22–32)
TCO2: 22 mmol/L (ref 22–32)
pCO2 arterial: 31.1 mmHg — ABNORMAL LOW (ref 32.0–48.0)
pCO2 arterial: 38.9 mmHg (ref 32.0–48.0)
pCO2 arterial: 32.3 mmHg (ref 32.0–48.0)
pH, Arterial: 7.503 — ABNORMAL HIGH (ref 7.350–7.450)
pH, Arterial: 7.356 (ref 7.350–7.450)
pH, Arterial: 7.404 (ref 7.350–7.450)
pO2, Arterial: 394 mmHg — ABNORMAL HIGH (ref 83.0–108.0)
pO2, Arterial: 87 mmHg (ref 83.0–108.0)
pO2, Arterial: 51 mmHg — ABNORMAL LOW (ref 83.0–108.0)

## 2018-08-27 LAB — CBC
HCT: 45.5 % (ref 39.0–52.0)
HCT: 31.7 % — ABNORMAL LOW (ref 39.0–52.0)
HCT: 33.2 % — ABNORMAL LOW (ref 39.0–52.0)
Hemoglobin: 15.8 g/dL (ref 13.0–17.0)
Hemoglobin: 10.9 g/dL — ABNORMAL LOW (ref 13.0–17.0)
Hemoglobin: 11.4 g/dL — ABNORMAL LOW (ref 13.0–17.0)
MCH: 31.7 pg (ref 26.0–34.0)
MCH: 31.6 pg (ref 26.0–34.0)
MCH: 31.2 pg (ref 26.0–34.0)
MCHC: 34.7 g/dL (ref 30.0–36.0)
MCHC: 34.4 g/dL (ref 30.0–36.0)
MCHC: 34.3 g/dL (ref 30.0–36.0)
MCV: 91.4 fL (ref 80.0–100.0)
MCV: 91.9 fL (ref 80.0–100.0)
MCV: 91 fL (ref 80.0–100.0)
Platelets: 300 10*3/uL (ref 150–400)
Platelets: 135 10*3/uL — ABNORMAL LOW (ref 150–400)
Platelets: 129 10*3/uL — ABNORMAL LOW (ref 150–400)
RBC: 4.98 MIL/uL (ref 4.22–5.81)
RBC: 3.45 MIL/uL — ABNORMAL LOW (ref 4.22–5.81)
RBC: 3.65 MIL/uL — ABNORMAL LOW (ref 4.22–5.81)
RDW: 12 % (ref 11.5–15.5)
RDW: 11.9 % (ref 11.5–15.5)
RDW: 12.5 % (ref 11.5–15.5)
WBC: 6.3 10*3/uL (ref 4.0–10.5)
WBC: 10.8 10*3/uL — ABNORMAL HIGH (ref 4.0–10.5)
WBC: 11.9 10*3/uL — ABNORMAL HIGH (ref 4.0–10.5)
nRBC: 0 % (ref 0.0–0.2)
nRBC: 0 % (ref 0.0–0.2)
nRBC: 0 % (ref 0.0–0.2)

## 2018-08-27 LAB — PREPARE RBC (CROSSMATCH)

## 2018-08-27 LAB — GLUCOSE, CAPILLARY
Glucose-Capillary: 108 mg/dL — ABNORMAL HIGH (ref 70–99)
Glucose-Capillary: 101 mg/dL — ABNORMAL HIGH (ref 70–99)
Glucose-Capillary: 132 mg/dL — ABNORMAL HIGH (ref 70–99)
Glucose-Capillary: 143 mg/dL — ABNORMAL HIGH (ref 70–99)
Glucose-Capillary: 129 mg/dL — ABNORMAL HIGH (ref 70–99)
Glucose-Capillary: 123 mg/dL — ABNORMAL HIGH (ref 70–99)
Glucose-Capillary: 110 mg/dL — ABNORMAL HIGH (ref 70–99)

## 2018-08-27 LAB — COMPREHENSIVE METABOLIC PANEL
ALT: 46 U/L — ABNORMAL HIGH (ref 0–44)
AST: 43 U/L — ABNORMAL HIGH (ref 15–41)
Albumin: 3.9 g/dL (ref 3.5–5.0)
Alkaline Phosphatase: 67 U/L (ref 38–126)
Anion gap: 11 (ref 5–15)
BUN: 20 mg/dL (ref 8–23)
CO2: 25 mmol/L (ref 22–32)
Calcium: 9.2 mg/dL (ref 8.9–10.3)
Chloride: 103 mmol/L (ref 98–111)
Creatinine, Ser: 1.39 mg/dL — ABNORMAL HIGH (ref 0.61–1.24)
GFR calc Af Amer: 56 mL/min — ABNORMAL LOW (ref >60)
GFR calc non Af Amer: 48 mL/min — ABNORMAL LOW (ref >60)
Glucose, Bld: 109 mg/dL — ABNORMAL HIGH (ref 70–99)
Potassium: 3.5 mmol/L (ref 3.5–5.1)
Sodium: 139 mmol/L (ref 135–145)
Total Bilirubin: 0.8 mg/dL (ref 0.3–1.2)
Total Protein: 7.1 g/dL (ref 6.5–8.1)

## 2018-08-27 LAB — PLATELET COUNT: Platelets: 158 10*3/uL (ref 150–400)

## 2018-08-27 LAB — ECHO INTRAOPERATIVE TEE
Height: 63 in
Height: 63 in
Weight: 2259.2 oz
Weight: 2259.2 oz

## 2018-08-27 LAB — PROTIME-INR
INR: 1.7 — ABNORMAL HIGH (ref 0.8–1.2)
Prothrombin Time: 19.4 seconds — ABNORMAL HIGH (ref 11.4–15.2)

## 2018-08-27 LAB — HEMOGLOBIN AND HEMATOCRIT, BLOOD
HCT: 26.5 % — ABNORMAL LOW (ref 39.0–52.0)
Hemoglobin: 9 g/dL — ABNORMAL LOW (ref 13.0–17.0)

## 2018-08-27 LAB — APTT: aPTT: 37 seconds — ABNORMAL HIGH (ref 24–36)

## 2018-08-27 SURGERY — CORONARY ARTERY BYPASS GRAFTING (CABG)
Anesthesia: General | Site: Chest

## 2018-08-27 MED ORDER — CHLORHEXIDINE GLUCONATE 0.12 % MT SOLN
15.0000 mL | OROMUCOSAL | Status: AC
  Administered 2018-08-27: 15 mL via OROMUCOSAL

## 2018-08-27 MED ORDER — MAGNESIUM SULFATE 4 GM/100ML IV SOLN
4.0000 g | Freq: Once | INTRAVENOUS | Status: DC
  Filled 2018-08-27: qty 100

## 2018-08-27 MED ORDER — ALBUMIN HUMAN 5 % IV SOLN
250.0000 mL | INTRAVENOUS | Status: AC | PRN
  Administered 2018-08-27 – 2018-08-28 (×4): 12.5 g via INTRAVENOUS
  Filled 2018-08-27 (×3): qty 250

## 2018-08-27 MED ORDER — EPHEDRINE 5 MG/ML INJ
INTRAVENOUS | Status: AC
  Filled 2018-08-27: qty 10

## 2018-08-27 MED ORDER — ACETAMINOPHEN 650 MG RE SUPP: 650.0000 mg | Freq: Once | RECTAL | Status: DC

## 2018-08-27 MED ORDER — SODIUM CHLORIDE 0.9% IV SOLUTION
Freq: Once | INTRAVENOUS | Status: AC
  Administered 2018-08-27: 15:00:00 via INTRAVENOUS

## 2018-08-27 MED ORDER — 0.9 % SODIUM CHLORIDE (POUR BTL) OPTIME
TOPICAL | Status: DC | PRN
  Administered 2018-08-27: 07:00:00 6000 mL

## 2018-08-27 MED ORDER — TRANEXAMIC ACID (OHS) BOLUS VIA INFUSION
15.0000 mg/kg | INTRAVENOUS | Status: DC
  Filled 2018-08-27: qty 960

## 2018-08-27 MED ORDER — ROCURONIUM BROMIDE 10 MG/ML (PF) SYRINGE
PREFILLED_SYRINGE | INTRAVENOUS | Status: DC | PRN
  Administered 2018-08-27 (×2): 100 mg via INTRAVENOUS

## 2018-08-27 MED ORDER — BISACODYL 10 MG RE SUPP: 10.0000 mg | Freq: Every day | RECTAL | Status: DC

## 2018-08-27 MED ORDER — SODIUM BICARBONATE 8.4 % IV SOLN
INTRAVENOUS | Status: AC
  Filled 2018-08-27: qty 50

## 2018-08-27 MED ORDER — OXYCODONE HCL 5 MG PO TABS
5.0000 mg | ORAL_TABLET | ORAL | Status: DC | PRN
  Administered 2018-08-28 – 2018-08-30 (×2): 10 mg via ORAL
  Filled 2018-08-27 (×2): qty 2

## 2018-08-27 MED ORDER — POTASSIUM CHLORIDE 10 MEQ/50ML IV SOLN
10.0000 meq | INTRAVENOUS | Status: DC
  Administered 2018-08-27 (×2): 10 meq via INTRAVENOUS

## 2018-08-27 MED ORDER — DEXMEDETOMIDINE HCL IN NACL 400 MCG/100ML IV SOLN
0.1000 ug/kg/h | INTRAVENOUS | Status: AC
  Administered 2018-08-27: 0.7 ug/kg/h via INTRAVENOUS
  Filled 2018-08-27: qty 100

## 2018-08-27 MED ORDER — MORPHINE SULFATE (PF) 2 MG/ML IV SOLN
1.0000 mg | INTRAVENOUS | Status: DC | PRN
  Administered 2018-08-27 – 2018-08-28 (×2): 4 mg via INTRAVENOUS
  Filled 2018-08-27 (×2): qty 2

## 2018-08-27 MED ORDER — HEPARIN SODIUM (PORCINE) 1000 UNIT/ML IJ SOLN
INTRAMUSCULAR | Status: AC
  Filled 2018-08-27: qty 1

## 2018-08-27 MED ORDER — METOPROLOL TARTRATE 25 MG/10 ML ORAL SUSPENSION: 12.5000 mg | Freq: Two times a day (BID) | ORAL | Status: DC

## 2018-08-27 MED ORDER — METOPROLOL TARTRATE 12.5 MG HALF TABLET
12.5000 mg | ORAL_TABLET | Freq: Two times a day (BID) | ORAL | Status: DC
  Administered 2018-08-28 – 2018-08-30 (×3): 12.5 mg via ORAL
  Filled 2018-08-27 (×4): qty 1

## 2018-08-27 MED ORDER — MIDAZOLAM HCL (PF) 10 MG/2ML IJ SOLN
INTRAMUSCULAR | Status: AC
  Filled 2018-08-27: qty 2

## 2018-08-27 MED ORDER — SODIUM CHLORIDE 0.9 % IV SOLN
INTRAVENOUS | Status: DC
  Filled 2018-08-27: qty 30

## 2018-08-27 MED ORDER — VANCOMYCIN HCL 1000 MG IV SOLR
INTRAVENOUS | Status: DC
  Filled 2018-08-27: qty 1000

## 2018-08-27 MED ORDER — ROCURONIUM BROMIDE 10 MG/ML (PF) SYRINGE
PREFILLED_SYRINGE | INTRAVENOUS | Status: AC
  Filled 2018-08-27: qty 20

## 2018-08-27 MED ORDER — VANCOMYCIN HCL IN DEXTROSE 1-5 GM/200ML-% IV SOLN: 1000.0000 mg | Freq: Once | INTRAVENOUS | Status: DC

## 2018-08-27 MED ORDER — HEMOSTATIC AGENTS (NO CHARGE) OPTIME
TOPICAL | Status: DC | PRN
  Administered 2018-08-27: 1 via TOPICAL

## 2018-08-27 MED ORDER — LACTATED RINGERS IV SOLN
INTRAVENOUS | Status: DC
  Administered 2018-08-27 – 2018-08-28 (×2): via INTRAVENOUS

## 2018-08-27 MED ORDER — LACTATED RINGERS IV SOLN
INTRAVENOUS | Status: DC | PRN
  Administered 2018-08-27: 16:00:00 via INTRAVENOUS

## 2018-08-27 MED ORDER — ACETAMINOPHEN 500 MG PO TABS: 1000.0000 mg | ORAL_TABLET | Freq: Four times a day (QID) | ORAL | Status: DC

## 2018-08-27 MED ORDER — ACETAMINOPHEN 160 MG/5ML PO SOLN
1000.0000 mg | Freq: Four times a day (QID) | ORAL | Status: DC
  Administered 2018-08-27: 1000 mg
  Filled 2018-08-27: qty 40.6

## 2018-08-27 MED ORDER — SODIUM CHLORIDE 0.9 % IV SOLN
INTRAVENOUS | Status: DC | PRN
  Administered 2018-08-27: 0.2 ug/kg/h via INTRAVENOUS

## 2018-08-27 MED ORDER — INSULIN REGULAR BOLUS VIA INFUSION
0.0000 [IU] | Freq: Three times a day (TID) | INTRAVENOUS | Status: DC
  Filled 2018-08-27: qty 10

## 2018-08-27 MED ORDER — FAMOTIDINE IN NACL 20-0.9 MG/50ML-% IV SOLN
20.0000 mg | INTRAVENOUS | Status: AC
  Administered 2018-08-27 – 2018-08-28 (×2): 20 mg via INTRAVENOUS
  Filled 2018-08-27 (×2): qty 50

## 2018-08-27 MED ORDER — BISACODYL 5 MG PO TBEC: 10.0000 mg | DELAYED_RELEASE_TABLET | Freq: Every day | ORAL | Status: DC

## 2018-08-27 MED ORDER — ACETAMINOPHEN 160 MG/5ML PO SOLN: 1000.0000 mg | Freq: Four times a day (QID) | ORAL | Status: DC

## 2018-08-27 MED ORDER — DEXMEDETOMIDINE HCL IN NACL 200 MCG/50ML IV SOLN: 0.0000 ug/kg/h | INTRAVENOUS | Status: DC

## 2018-08-27 MED ORDER — MAGNESIUM SULFATE 4 GM/100ML IV SOLN
4.0000 g | Freq: Once | INTRAVENOUS | Status: AC
  Administered 2018-08-27: 4 g via INTRAVENOUS
  Filled 2018-08-27: qty 100

## 2018-08-27 MED ORDER — COAGULATION FACTOR VIIA RECOMB 1 MG IV SOLR
45.0000 ug/kg | Freq: Once | INTRAVENOUS | Status: AC
  Administered 2018-08-27: 3000 ug via INTRAVENOUS
  Filled 2018-08-27: qty 2

## 2018-08-27 MED ORDER — TRAMADOL HCL 50 MG PO TABS
50.0000 mg | ORAL_TABLET | ORAL | Status: DC | PRN
  Administered 2018-08-31: 100 mg via ORAL
  Filled 2018-08-27: qty 2
  Filled 2018-08-27: qty 1

## 2018-08-27 MED ORDER — SODIUM CHLORIDE 0.9 % IV SOLN: INTRAVENOUS | Status: DC

## 2018-08-27 MED ORDER — FENTANYL CITRATE (PF) 250 MCG/5ML IJ SOLN
INTRAMUSCULAR | Status: AC
  Filled 2018-08-27: qty 5

## 2018-08-27 MED ORDER — PANTOPRAZOLE SODIUM 40 MG PO TBEC: 40.0000 mg | DELAYED_RELEASE_TABLET | Freq: Every day | ORAL | Status: DC

## 2018-08-27 MED ORDER — PROTAMINE SULFATE 10 MG/ML IV SOLN
INTRAVENOUS | Status: DC | PRN
  Administered 2018-08-27: 230 mg via INTRAVENOUS
  Administered 2018-08-27: 20 mg via INTRAVENOUS

## 2018-08-27 MED ORDER — LACTATED RINGERS IV SOLN: INTRAVENOUS | Status: DC

## 2018-08-27 MED ORDER — POTASSIUM CHLORIDE 2 MEQ/ML IV SOLN
80.0000 meq | INTRAVENOUS | Status: DC
  Filled 2018-08-27: qty 40

## 2018-08-27 MED ORDER — CALCIUM CHLORIDE 10 % IV SOLN
INTRAVENOUS | Status: DC | PRN
  Administered 2018-08-27: 200 mg via INTRAVENOUS
  Administered 2018-08-27: 100 mg via INTRAVENOUS
  Administered 2018-08-27 (×2): 200 mg via INTRAVENOUS
  Administered 2018-08-27: 100 mg via INTRAVENOUS
  Administered 2018-08-27: 200 mg via INTRAVENOUS

## 2018-08-27 MED ORDER — SODIUM CHLORIDE 0.9 % IV SOLN
1.5000 g | INTRAVENOUS | Status: DC
  Filled 2018-08-27: qty 1.5

## 2018-08-27 MED ORDER — PHENYLEPHRINE HCL-NACL 20-0.9 MG/250ML-% IV SOLN: 0.0000 ug/min | INTRAVENOUS | Status: DC

## 2018-08-27 MED ORDER — MILRINONE LACTATE IN DEXTROSE 20-5 MG/100ML-% IV SOLN
0.3000 ug/kg/min | INTRAVENOUS | Status: DC
  Filled 2018-08-27: qty 100

## 2018-08-27 MED ORDER — DOCUSATE SODIUM 100 MG PO CAPS: 200.0000 mg | ORAL_CAPSULE | Freq: Every day | ORAL | Status: DC

## 2018-08-27 MED ORDER — SODIUM CHLORIDE 0.9% IV SOLUTION: Freq: Once | INTRAVENOUS | Status: DC

## 2018-08-27 MED ORDER — PROPOFOL 10 MG/ML IV BOLUS
INTRAVENOUS | Status: DC | PRN
  Administered 2018-08-27: 50 mg via INTRAVENOUS

## 2018-08-27 MED ORDER — ACETAMINOPHEN 500 MG PO TABS
1000.0000 mg | ORAL_TABLET | Freq: Four times a day (QID) | ORAL | Status: AC
  Administered 2018-08-28 – 2018-09-01 (×15): 1000 mg via ORAL
  Filled 2018-08-27 (×18): qty 2

## 2018-08-27 MED ORDER — ALBUMIN HUMAN 5 % IV SOLN
INTRAVENOUS | Status: DC | PRN
  Administered 2018-08-27: 18:00:00 via INTRAVENOUS

## 2018-08-27 MED ORDER — MIDAZOLAM HCL 2 MG/2ML IJ SOLN: 2.0000 mg | INTRAMUSCULAR | Status: DC | PRN

## 2018-08-27 MED ORDER — PHENYLEPHRINE HCL-NACL 20-0.9 MG/250ML-% IV SOLN
0.0000 ug/min | INTRAVENOUS | Status: DC
  Administered 2018-08-27: 23:00:00 20 ug/min via INTRAVENOUS
  Administered 2018-08-27: 5 ug/min via INTRAVENOUS
  Filled 2018-08-27: qty 250

## 2018-08-27 MED ORDER — SODIUM CHLORIDE 0.9% FLUSH: 3.0000 mL | INTRAVENOUS | Status: DC | PRN

## 2018-08-27 MED ORDER — SUCCINYLCHOLINE CHLORIDE 20 MG/ML IJ SOLN
INTRAMUSCULAR | Status: DC | PRN
  Administered 2018-08-27: 100 mg via INTRAVENOUS

## 2018-08-27 MED ORDER — NITROGLYCERIN IN D5W 200-5 MCG/ML-% IV SOLN: 0.0000 ug/min | INTRAVENOUS | Status: DC

## 2018-08-27 MED ORDER — LACTATED RINGERS IV SOLN: 500.0000 mL | Freq: Once | INTRAVENOUS | Status: DC | PRN

## 2018-08-27 MED ORDER — INSULIN REGULAR(HUMAN) IN NACL 100-0.9 UT/100ML-% IV SOLN: INTRAVENOUS | Status: DC

## 2018-08-27 MED ORDER — SODIUM CHLORIDE 0.9 % IV SOLN
INTRAVENOUS | Status: DC | PRN
  Administered 2018-08-27: 750 mg via INTRAVENOUS

## 2018-08-27 MED ORDER — TRANEXAMIC ACID (OHS) PUMP PRIME SOLUTION
2.0000 mg/kg | INTRAVENOUS | Status: DC
  Filled 2018-08-27: qty 1.28

## 2018-08-27 MED ORDER — METOPROLOL TARTRATE 5 MG/5ML IV SOLN
2.5000 mg | INTRAVENOUS | Status: DC | PRN
  Administered 2018-08-29: 5 mg via INTRAVENOUS
  Filled 2018-08-27: qty 5

## 2018-08-27 MED ORDER — ALBUMIN HUMAN 5 % IV SOLN
250.0000 mL | INTRAVENOUS | Status: AC | PRN
  Administered 2018-08-27 (×4): 12.5 g via INTRAVENOUS
  Filled 2018-08-27: qty 500

## 2018-08-27 MED ORDER — TRANEXAMIC ACID 1000 MG/10ML IV SOLN
1.5000 mg/kg/h | INTRAVENOUS | Status: AC
  Administered 2018-08-27: 1.5 mg/kg/h via INTRAVENOUS
  Filled 2018-08-27: qty 25

## 2018-08-27 MED ORDER — OXYCODONE HCL 5 MG PO TABS: 5.0000 mg | ORAL_TABLET | ORAL | Status: DC | PRN

## 2018-08-27 MED ORDER — SUCCINYLCHOLINE CHLORIDE 200 MG/10ML IV SOSY
PREFILLED_SYRINGE | INTRAVENOUS | Status: AC
  Filled 2018-08-27: qty 10

## 2018-08-27 MED ORDER — ASPIRIN 81 MG PO CHEW: 324.0000 mg | CHEWABLE_TABLET | Freq: Every day | ORAL | Status: DC

## 2018-08-27 MED ORDER — ASPIRIN EC 325 MG PO TBEC: 325.0000 mg | DELAYED_RELEASE_TABLET | Freq: Every day | ORAL | Status: DC

## 2018-08-27 MED ORDER — METOPROLOL TARTRATE 12.5 MG HALF TABLET: 12.5000 mg | ORAL_TABLET | Freq: Two times a day (BID) | ORAL | Status: DC

## 2018-08-27 MED ORDER — SODIUM CHLORIDE 0.9 % IV SOLN
750.0000 mg | INTRAVENOUS | Status: DC
  Filled 2018-08-27: qty 750

## 2018-08-27 MED ORDER — SODIUM CHLORIDE 0.9 % IV SOLN
INTRAVENOUS | Status: DC | PRN
  Administered 2018-08-27: 20 ug/min via INTRAVENOUS

## 2018-08-27 MED ORDER — PHENYLEPHRINE HCL-NACL 20-0.9 MG/250ML-% IV SOLN
30.0000 ug/min | INTRAVENOUS | Status: AC
  Administered 2018-08-27 (×2): 30 ug/min via INTRAVENOUS
  Filled 2018-08-27: qty 250

## 2018-08-27 MED ORDER — SODIUM BICARBONATE 8.4 % IV SOLN
INTRAVENOUS | Status: DC | PRN
  Administered 2018-08-27: 50 meq via INTRAVENOUS

## 2018-08-27 MED ORDER — ALBUMIN HUMAN 5 % IV SOLN
INTRAVENOUS | Status: DC | PRN
  Administered 2018-08-27 (×2): via INTRAVENOUS

## 2018-08-27 MED ORDER — PLASMA-LYTE 148 IV SOLN
INTRAVENOUS | Status: DC
  Filled 2018-08-27: qty 2.5

## 2018-08-27 MED ORDER — PANTOPRAZOLE SODIUM 40 MG PO TBEC
40.0000 mg | DELAYED_RELEASE_TABLET | Freq: Every day | ORAL | Status: DC
  Administered 2018-08-29 – 2018-09-02 (×5): 40 mg via ORAL
  Filled 2018-08-27 (×5): qty 1

## 2018-08-27 MED ORDER — POTASSIUM CHLORIDE 10 MEQ/50ML IV SOLN
10.0000 meq | INTRAVENOUS | Status: AC
  Administered 2018-08-27 (×3): 10 meq via INTRAVENOUS
  Filled 2018-08-27 (×2): qty 50

## 2018-08-27 MED ORDER — FENTANYL CITRATE (PF) 250 MCG/5ML IJ SOLN
INTRAMUSCULAR | Status: DC | PRN
  Administered 2018-08-27 (×3): 250 ug via INTRAVENOUS
  Administered 2018-08-27: 450 ug via INTRAVENOUS
  Administered 2018-08-27: 50 ug via INTRAVENOUS

## 2018-08-27 MED ORDER — SODIUM CHLORIDE 0.9 % IV SOLN
1.5000 g | Freq: Two times a day (BID) | INTRAVENOUS | Status: AC
  Administered 2018-08-27 – 2018-08-29 (×4): 1.5 g via INTRAVENOUS
  Filled 2018-08-27 (×4): qty 1.5

## 2018-08-27 MED ORDER — SODIUM CHLORIDE (PF) 0.9 % IJ SOLN
INTRAMUSCULAR | Status: AC
  Filled 2018-08-27: qty 10

## 2018-08-27 MED ORDER — CALCIUM CHLORIDE 10 % IV SOLN
INTRAVENOUS | Status: AC
  Filled 2018-08-27: qty 10

## 2018-08-27 MED ORDER — SODIUM CHLORIDE (PF) 0.9 % IJ SOLN
OROMUCOSAL | Status: DC | PRN
  Administered 2018-08-27 (×3): 4 mL via TOPICAL

## 2018-08-27 MED ORDER — SODIUM CHLORIDE 0.9 % IV SOLN: 250.0000 mL | INTRAVENOUS | Status: DC

## 2018-08-27 MED ORDER — LACTATED RINGERS IV SOLN
INTRAVENOUS | Status: DC | PRN
  Administered 2018-08-27: 07:00:00 via INTRAVENOUS

## 2018-08-27 MED ORDER — FENTANYL CITRATE (PF) 250 MCG/5ML IJ SOLN
INTRAMUSCULAR | Status: AC
  Filled 2018-08-27: qty 25

## 2018-08-27 MED ORDER — VANCOMYCIN HCL 10 G IV SOLR
1250.0000 mg | INTRAVENOUS | Status: AC
  Administered 2018-08-27: 1250 mg via INTRAVENOUS
  Filled 2018-08-27: qty 1250

## 2018-08-27 MED ORDER — EPHEDRINE SULFATE 50 MG/ML IJ SOLN
INTRAMUSCULAR | Status: DC | PRN
  Administered 2018-08-27: 10 mg via INTRAVENOUS
  Administered 2018-08-27: 5 mg via INTRAVENOUS

## 2018-08-27 MED ORDER — SODIUM CHLORIDE 0.9 % IV SOLN
1.5000 g | Freq: Two times a day (BID) | INTRAVENOUS | Status: DC
  Administered 2018-08-27: 1.5 g via INTRAVENOUS
  Filled 2018-08-27 (×2): qty 1.5

## 2018-08-27 MED ORDER — ACETAMINOPHEN 160 MG/5ML PO SOLN: 650.0000 mg | Freq: Once | ORAL | Status: DC

## 2018-08-27 MED ORDER — HEPARIN SODIUM (PORCINE) 1000 UNIT/ML IJ SOLN
INTRAMUSCULAR | Status: DC | PRN
  Administered 2018-08-27: 2000 [IU] via INTRAVENOUS
  Administered 2018-08-27: 23000 [IU] via INTRAVENOUS

## 2018-08-27 MED ORDER — MIDAZOLAM HCL 2 MG/2ML IJ SOLN
INTRAMUSCULAR | Status: DC | PRN
  Administered 2018-08-27: 2 mg via INTRAVENOUS

## 2018-08-27 MED ORDER — NOREPINEPHRINE 4 MG/250ML-% IV SOLN
0.0000 ug/min | INTRAVENOUS | Status: DC
  Filled 2018-08-27: qty 250

## 2018-08-27 MED ORDER — FAMOTIDINE IN NACL 20-0.9 MG/50ML-% IV SOLN
20.0000 mg | Freq: Two times a day (BID) | INTRAVENOUS | Status: DC
  Administered 2018-08-27: 20 mg via INTRAVENOUS

## 2018-08-27 MED ORDER — METOPROLOL TARTRATE 5 MG/5ML IV SOLN: 2.5000 mg | INTRAVENOUS | Status: DC | PRN

## 2018-08-27 MED ORDER — SODIUM CHLORIDE 0.9 % IV SOLN
250.0000 mL | INTRAVENOUS | Status: DC
  Administered 2018-08-29: 250 mL via INTRAVENOUS

## 2018-08-27 MED ORDER — SODIUM CHLORIDE 0.9 % IV SOLN
INTRAVENOUS | Status: DC
  Administered 2018-08-27: 13:00:00 via INTRAVENOUS

## 2018-08-27 MED ORDER — ONDANSETRON HCL 4 MG/2ML IJ SOLN: 4.0000 mg | Freq: Four times a day (QID) | INTRAMUSCULAR | Status: DC | PRN

## 2018-08-27 MED ORDER — PROPOFOL 10 MG/ML IV BOLUS
INTRAVENOUS | Status: AC
  Filled 2018-08-27: qty 20

## 2018-08-27 MED ORDER — DOCUSATE SODIUM 100 MG PO CAPS
200.0000 mg | ORAL_CAPSULE | Freq: Every day | ORAL | Status: DC
  Administered 2018-08-28 – 2018-09-01 (×4): 200 mg via ORAL
  Filled 2018-08-27 (×5): qty 2

## 2018-08-27 MED ORDER — DEXMEDETOMIDINE HCL IN NACL 200 MCG/50ML IV SOLN
INTRAVENOUS | Status: AC
  Filled 2018-08-27: qty 50

## 2018-08-27 MED ORDER — SODIUM CHLORIDE 0.9% FLUSH
3.0000 mL | Freq: Two times a day (BID) | INTRAVENOUS | Status: DC
  Administered 2018-08-28 – 2018-09-01 (×8): 3 mL via INTRAVENOUS

## 2018-08-27 MED ORDER — SODIUM CHLORIDE 0.45 % IV SOLN
INTRAVENOUS | Status: DC | PRN
  Administered 2018-08-27: 13:00:00 via INTRAVENOUS

## 2018-08-27 MED ORDER — NITROGLYCERIN IN D5W 200-5 MCG/ML-% IV SOLN
2.0000 ug/min | INTRAVENOUS | Status: DC
  Filled 2018-08-27: qty 250

## 2018-08-27 MED ORDER — POTASSIUM CHLORIDE 10 MEQ/50ML IV SOLN
10.0000 meq | INTRAVENOUS | Status: AC
  Administered 2018-08-27 – 2018-08-28 (×3): 10 meq via INTRAVENOUS
  Filled 2018-08-27 (×3): qty 50

## 2018-08-27 MED ORDER — DOPAMINE-DEXTROSE 3.2-5 MG/ML-% IV SOLN
0.0000 ug/kg/min | INTRAVENOUS | Status: DC
  Filled 2018-08-27: qty 250

## 2018-08-27 MED ORDER — MAGNESIUM SULFATE 50 % IJ SOLN
40.0000 meq | INTRAMUSCULAR | Status: DC
  Filled 2018-08-27: qty 9.85

## 2018-08-27 MED ORDER — MIDAZOLAM HCL 2 MG/2ML IJ SOLN
INTRAMUSCULAR | Status: AC
  Filled 2018-08-27: qty 2

## 2018-08-27 MED ORDER — MIDAZOLAM HCL 5 MG/5ML IJ SOLN
INTRAMUSCULAR | Status: DC | PRN
  Administered 2018-08-27: 2 mg via INTRAVENOUS
  Administered 2018-08-27: 1 mg via INTRAVENOUS
  Administered 2018-08-27: 2 mg via INTRAVENOUS

## 2018-08-27 MED ORDER — FENTANYL CITRATE (PF) 250 MCG/5ML IJ SOLN
INTRAMUSCULAR | Status: DC | PRN
  Administered 2018-08-27: 150 ug via INTRAVENOUS
  Administered 2018-08-27: 100 ug via INTRAVENOUS

## 2018-08-27 MED ORDER — EPINEPHRINE PF 1 MG/ML IJ SOLN
0.0000 ug/min | INTRAVENOUS | Status: DC
  Filled 2018-08-27: qty 4

## 2018-08-27 MED ORDER — SODIUM CHLORIDE 0.9 % IV SOLN
INTRAVENOUS | Status: DC
  Administered 2018-08-27: 13:00:00 1000 mL via INTRAVENOUS

## 2018-08-27 MED ORDER — SODIUM CHLORIDE 0.9% FLUSH: 3.0000 mL | Freq: Two times a day (BID) | INTRAVENOUS | Status: DC

## 2018-08-27 MED ORDER — VANCOMYCIN HCL IN DEXTROSE 1-5 GM/200ML-% IV SOLN
1000.0000 mg | Freq: Once | INTRAVENOUS | Status: AC
  Administered 2018-08-28: 1000 mg via INTRAVENOUS
  Filled 2018-08-27: qty 200

## 2018-08-27 MED ORDER — ROCURONIUM BROMIDE 10 MG/ML (PF) SYRINGE
PREFILLED_SYRINGE | INTRAVENOUS | Status: DC | PRN
  Administered 2018-08-27 (×2): 50 mg via INTRAVENOUS

## 2018-08-27 MED ORDER — BISACODYL 5 MG PO TBEC
10.0000 mg | DELAYED_RELEASE_TABLET | Freq: Every day | ORAL | Status: DC
  Administered 2018-08-28 – 2018-09-01 (×4): 10 mg via ORAL
  Filled 2018-08-27 (×5): qty 2

## 2018-08-27 MED ORDER — TRANEXAMIC ACID 1000 MG/10ML IV SOLN
INTRAVENOUS | Status: DC | PRN
  Administered 2018-08-27: 1.5 mg/kg/h via INTRAVENOUS

## 2018-08-27 MED ORDER — SODIUM CHLORIDE 0.45 % IV SOLN: INTRAVENOUS | Status: DC | PRN

## 2018-08-27 MED ORDER — SODIUM CHLORIDE 0.9 % IV SOLN
INTRAVENOUS | Status: DC
  Administered 2018-08-27 – 2018-08-28 (×2): via INTRAVENOUS

## 2018-08-27 MED ORDER — ONDANSETRON HCL 4 MG/2ML IJ SOLN
4.0000 mg | Freq: Four times a day (QID) | INTRAMUSCULAR | Status: DC | PRN
  Administered 2018-08-28 – 2018-08-29 (×2): 4 mg via INTRAVENOUS
  Filled 2018-08-27 (×2): qty 2

## 2018-08-27 MED ORDER — LACTATED RINGERS IV SOLN
500.0000 mL | Freq: Once | INTRAVENOUS | Status: AC | PRN
  Administered 2018-08-27: 500 mL via INTRAVENOUS

## 2018-08-27 MED ORDER — LACTATED RINGERS IV SOLN
INTRAVENOUS | Status: DC
  Administered 2018-08-30: 08:00:00 via INTRAVENOUS

## 2018-08-27 MED ORDER — MORPHINE SULFATE (PF) 2 MG/ML IV SOLN: 1.0000 mg | INTRAVENOUS | Status: DC | PRN

## 2018-08-27 MED ORDER — TRAMADOL HCL 50 MG PO TABS: 50.0000 mg | ORAL_TABLET | ORAL | Status: DC | PRN

## 2018-08-27 MED ORDER — INSULIN REGULAR(HUMAN) IN NACL 100-0.9 UT/100ML-% IV SOLN
INTRAVENOUS | Status: DC
  Filled 2018-08-27: qty 100

## 2018-08-27 SURGICAL SUPPLY — 101 items
BAG DECANTER FOR FLEXI CONT (MISCELLANEOUS) ×3 IMPLANT
BANDAGE ACE 4X5 VEL STRL LF (GAUZE/BANDAGES/DRESSINGS) IMPLANT
BANDAGE ACE 6X5 VEL STRL LF (GAUZE/BANDAGES/DRESSINGS) IMPLANT
BASKET HEART (ORDER IN 25'S) (MISCELLANEOUS)
BASKET HEART (ORDER IN 25S) (MISCELLANEOUS) IMPLANT
BLADE CLIPPER SURG (BLADE) IMPLANT
BLADE STERNUM SYSTEM 6 (BLADE) IMPLANT
BNDG GAUZE ELAST 4 BULKY (GAUZE/BANDAGES/DRESSINGS) IMPLANT
CANISTER SUCT 3000ML PPV (MISCELLANEOUS) ×3 IMPLANT
CANNULA EZ GLIDE AORTIC 21FR (CANNULA) IMPLANT
CATH CPB KIT OWEN (MISCELLANEOUS) ×3 IMPLANT
CATH THORACIC 36FR (CATHETERS) ×3 IMPLANT
CLIP RETRACTION 3.0MM CORONARY (MISCELLANEOUS) ×3 IMPLANT
CLIP VESOCCLUDE MED 24/CT (CLIP) IMPLANT
CLIP VESOCCLUDE SM WIDE 24/CT (CLIP) IMPLANT
CONN ST 1/4X3/8  BEN (MISCELLANEOUS) ×2
CONN ST 1/4X3/8 BEN (MISCELLANEOUS) ×4 IMPLANT
COVER WAND RF STERILE (DRAPES) IMPLANT
CRADLE DONUT ADULT HEAD (MISCELLANEOUS) ×3 IMPLANT
DRAIN CHANNEL 32F RND 10.7 FF (WOUND CARE) ×6 IMPLANT
DRAPE CARDIOVASCULAR INCISE (DRAPES) ×1
DRAPE INCISE IOBAN 66X45 STRL (DRAPES) ×3 IMPLANT
DRAPE SLUSH/WARMER DISC (DRAPES) ×3 IMPLANT
DRAPE SRG 135X102X78XABS (DRAPES) ×2 IMPLANT
DRSG AQUACEL AG ADV 3.5X14 (GAUZE/BANDAGES/DRESSINGS) ×3 IMPLANT
DRSG COVADERM 4X14 (GAUZE/BANDAGES/DRESSINGS) ×3 IMPLANT
ELECT BLADE 4.0 EZ CLEAN MEGAD (MISCELLANEOUS) ×6
ELECT REM PT RETURN 9FT ADLT (ELECTROSURGICAL) ×6
ELECTRODE BLDE 4.0 EZ CLN MEGD (MISCELLANEOUS) ×4 IMPLANT
ELECTRODE REM PT RTRN 9FT ADLT (ELECTROSURGICAL) ×4 IMPLANT
FELT TEFLON 1X6 (MISCELLANEOUS) ×6 IMPLANT
GAUZE SPONGE 4X4 12PLY STRL (GAUZE/BANDAGES/DRESSINGS) ×6 IMPLANT
GAUZE SPONGE 4X4 12PLY STRL LF (GAUZE/BANDAGES/DRESSINGS) ×3 IMPLANT
GLOVE BIO SURGEON STRL SZ 6 (GLOVE) ×6 IMPLANT
GLOVE BIO SURGEON STRL SZ 6.5 (GLOVE) ×6 IMPLANT
GLOVE BIOGEL PI IND STRL 6 (GLOVE) ×2 IMPLANT
GLOVE BIOGEL PI INDICATOR 6 (GLOVE) ×1
GLOVE ORTHO TXT STRL SZ7.5 (GLOVE) ×6 IMPLANT
GOWN STRL REUS W/ TWL LRG LVL3 (GOWN DISPOSABLE) ×8 IMPLANT
GOWN STRL REUS W/TWL LRG LVL3 (GOWN DISPOSABLE) ×4
HEMOSTAT POWDER SURGIFOAM 1G (HEMOSTASIS) ×9 IMPLANT
INSERT FOGARTY XLG (MISCELLANEOUS) IMPLANT
KIT BASIN OR (CUSTOM PROCEDURE TRAY) ×3 IMPLANT
KIT SUCTION CATH 14FR (SUCTIONS) ×9 IMPLANT
KIT TURNOVER KIT B (KITS) ×3 IMPLANT
KIT VASOVIEW HEMOPRO 2 VH 4000 (KITS) ×3 IMPLANT
LEAD PACING MYOCARDI (MISCELLANEOUS) ×3 IMPLANT
MARKER GRAFT CORONARY BYPASS (MISCELLANEOUS) ×9 IMPLANT
NS IRRIG 1000ML POUR BTL (IV SOLUTION) ×18 IMPLANT
PACK E OPEN HEART (SUTURE) ×3 IMPLANT
PACK OPEN HEART (CUSTOM PROCEDURE TRAY) ×3 IMPLANT
PAD ARMBOARD 7.5X6 YLW CONV (MISCELLANEOUS) ×6 IMPLANT
PAD ELECT DEFIB RADIOL ZOLL (MISCELLANEOUS) ×3 IMPLANT
PENCIL BUTTON HOLSTER BLD 10FT (ELECTRODE) ×3 IMPLANT
POWDER SURGICEL 3.0 GRAM (HEMOSTASIS) ×3 IMPLANT
PUNCH AORTIC ROTATE 4.0MM (MISCELLANEOUS) IMPLANT
PUNCH AORTIC ROTATE 4.5MM 8IN (MISCELLANEOUS) IMPLANT
PUNCH AORTIC ROTATE 5MM 8IN (MISCELLANEOUS) IMPLANT
SOLUTION ANTI FOG 6CC (MISCELLANEOUS) IMPLANT
SPONGE LAP 18X18 RF (DISPOSABLE) IMPLANT
SPONGE LAP 4X18 RFD (DISPOSABLE) IMPLANT
SUT BONE WAX W31G (SUTURE) ×3 IMPLANT
SUT ETHIBOND X763 2 0 SH 1 (SUTURE) ×6 IMPLANT
SUT MNCRL AB 3-0 PS2 18 (SUTURE) ×6 IMPLANT
SUT MNCRL AB 4-0 PS2 18 (SUTURE) IMPLANT
SUT PDS AB 1 CTX 36 (SUTURE) ×9 IMPLANT
SUT PROLENE 2 0 SH DA (SUTURE) IMPLANT
SUT PROLENE 3 0 SH DA (SUTURE) ×6 IMPLANT
SUT PROLENE 3 0 SH1 36 (SUTURE) IMPLANT
SUT PROLENE 4 0 RB 1 (SUTURE) ×2
SUT PROLENE 4 0 SH DA (SUTURE) IMPLANT
SUT PROLENE 4-0 RB1 .5 CRCL 36 (SUTURE) ×4 IMPLANT
SUT PROLENE 5 0 C 1 36 (SUTURE) IMPLANT
SUT PROLENE 6 0 C 1 30 (SUTURE) IMPLANT
SUT PROLENE 7.0 RB 3 (SUTURE) IMPLANT
SUT PROLENE 8 0 BV175 6 (SUTURE) IMPLANT
SUT PROLENE BLUE 7 0 (SUTURE) ×3 IMPLANT
SUT PROLENE POLY MONO (SUTURE) IMPLANT
SUT SILK  1 MH (SUTURE) ×1
SUT SILK 1 MH (SUTURE) ×2 IMPLANT
SUT STEEL 6MS V (SUTURE) ×3 IMPLANT
SUT STEEL STERNAL CCS#1 18IN (SUTURE) IMPLANT
SUT STEEL SZ 6 DBL 3X14 BALL (SUTURE) ×6 IMPLANT
SUT VIC AB 1 CTX 36 (SUTURE) ×1
SUT VIC AB 1 CTX36XBRD ANBCTR (SUTURE) ×2 IMPLANT
SUT VIC AB 2-0 CT1 27 (SUTURE)
SUT VIC AB 2-0 CT1 TAPERPNT 27 (SUTURE) IMPLANT
SUT VIC AB 2-0 CTX 27 (SUTURE) IMPLANT
SUT VIC AB 3-0 SH 27 (SUTURE)
SUT VIC AB 3-0 SH 27X BRD (SUTURE) IMPLANT
SUT VIC AB 3-0 X1 27 (SUTURE) IMPLANT
SUT VICRYL 4-0 PS2 18IN ABS (SUTURE) IMPLANT
SYSTEM SAHARA CHEST DRAIN ATS (WOUND CARE) ×3 IMPLANT
TAPE CLOTH SURG 4X10 WHT LF (GAUZE/BANDAGES/DRESSINGS) ×3 IMPLANT
TAPE PAPER 2X10 WHT MICROPORE (GAUZE/BANDAGES/DRESSINGS) ×3 IMPLANT
TOWEL GREEN STERILE (TOWEL DISPOSABLE) ×3 IMPLANT
TOWEL GREEN STERILE FF (TOWEL DISPOSABLE) ×3 IMPLANT
TRAY FOLEY SLVR 16FR TEMP STAT (SET/KITS/TRAYS/PACK) ×3 IMPLANT
TUBING LAP HI FLOW INSUFFLATIO (TUBING) IMPLANT
UNDERPAD 30X30 (UNDERPADS AND DIAPERS) IMPLANT
WATER STERILE IRR 1000ML POUR (IV SOLUTION) ×6 IMPLANT

## 2018-08-27 SURGICAL SUPPLY — 109 items
APPLIER CLIP 9.375 SM OPEN (CLIP) ×3
BAG DECANTER FOR FLEXI CONT (MISCELLANEOUS) ×6 IMPLANT
BANDAGE ACE 4X5 VEL STRL LF (GAUZE/BANDAGES/DRESSINGS) ×3 IMPLANT
BANDAGE ACE 6X5 VEL STRL LF (GAUZE/BANDAGES/DRESSINGS) ×3 IMPLANT
BANDAGE ELASTIC 4 VELCRO ST LF (GAUZE/BANDAGES/DRESSINGS) ×3 IMPLANT
BANDAGE ELASTIC 6 VELCRO ST LF (GAUZE/BANDAGES/DRESSINGS) ×3 IMPLANT
BASKET HEART (ORDER IN 25'S) (MISCELLANEOUS) ×1
BASKET HEART (ORDER IN 25S) (MISCELLANEOUS) ×2 IMPLANT
BLADE CLIPPER SURG (BLADE) IMPLANT
BLADE STERNUM SYSTEM 6 (BLADE) ×3 IMPLANT
BLADE SURG ROTATE 9660 (MISCELLANEOUS) ×3 IMPLANT
BNDG GAUZE ELAST 4 BULKY (GAUZE/BANDAGES/DRESSINGS) ×3 IMPLANT
CABLE PACING FASLOC BIEGE (MISCELLANEOUS) ×3 IMPLANT
CANISTER SUCT 3000ML PPV (MISCELLANEOUS) ×3 IMPLANT
CANNULA EZ GLIDE AORTIC 21FR (CANNULA) ×6 IMPLANT
CATH CPB KIT OWEN (MISCELLANEOUS) ×3 IMPLANT
CATH THORACIC 36FR (CATHETERS) ×3 IMPLANT
CLIP APPLIE 9.375 SM OPEN (CLIP) ×2 IMPLANT
CLIP FOGARTY SPRING 6M (CLIP) ×3 IMPLANT
CLIP RETRACTION 3.0MM CORONARY (MISCELLANEOUS) ×3 IMPLANT
CLIP VESOCCLUDE MED 24/CT (CLIP) IMPLANT
CLIP VESOCCLUDE SM WIDE 24/CT (CLIP) ×9 IMPLANT
CONN ST 1/4X3/8  BEN (MISCELLANEOUS) ×1
CONN ST 1/4X3/8 BEN (MISCELLANEOUS) ×2 IMPLANT
COVER WAND RF STERILE (DRAPES) ×3 IMPLANT
CRADLE DONUT ADULT HEAD (MISCELLANEOUS) ×3 IMPLANT
DERMABOND ADVANCED (GAUZE/BANDAGES/DRESSINGS) ×1
DERMABOND ADVANCED .7 DNX12 (GAUZE/BANDAGES/DRESSINGS) ×2 IMPLANT
DRAIN CHANNEL 32F RND 10.7 FF (WOUND CARE) ×6 IMPLANT
DRAPE CARDIOVASCULAR INCISE (DRAPES) ×1
DRAPE INCISE IOBAN 66X45 STRL (DRAPES) ×3 IMPLANT
DRAPE SLUSH/WARMER DISC (DRAPES) ×3 IMPLANT
DRAPE SRG 135X102X78XABS (DRAPES) ×2 IMPLANT
DRSG AQUACEL AG ADV 3.5X14 (GAUZE/BANDAGES/DRESSINGS) ×3 IMPLANT
DRSG COVADERM 4X14 (GAUZE/BANDAGES/DRESSINGS) ×3 IMPLANT
ELECT BLADE 4.0 EZ CLEAN MEGAD (MISCELLANEOUS) ×3
ELECT REM PT RETURN 9FT ADLT (ELECTROSURGICAL) ×6
ELECTRODE BLDE 4.0 EZ CLN MEGD (MISCELLANEOUS) ×2 IMPLANT
ELECTRODE REM PT RTRN 9FT ADLT (ELECTROSURGICAL) ×4 IMPLANT
FELT TEFLON 1X6 (MISCELLANEOUS) ×3 IMPLANT
GAUZE SPONGE 4X4 12PLY STRL (GAUZE/BANDAGES/DRESSINGS) ×6 IMPLANT
GLOVE BIO SURGEON STRL SZ 6.5 (GLOVE) ×12 IMPLANT
GLOVE ORTHO TXT STRL SZ7.5 (GLOVE) ×9 IMPLANT
GLOVE SURG SS PI 7.5 STRL IVOR (GLOVE) ×9 IMPLANT
GOWN STRL REUS W/ TWL LRG LVL3 (GOWN DISPOSABLE) ×16 IMPLANT
GOWN STRL REUS W/TWL LRG LVL3 (GOWN DISPOSABLE) ×8
HEMOSTAT POWDER SURGIFOAM 1G (HEMOSTASIS) ×9 IMPLANT
INSERT FOGARTY XLG (MISCELLANEOUS) ×3 IMPLANT
KIT BASIN OR (CUSTOM PROCEDURE TRAY) ×3 IMPLANT
KIT SUCTION CATH 14FR (SUCTIONS) ×9 IMPLANT
KIT TURNOVER KIT B (KITS) ×3 IMPLANT
KIT VASOVIEW HEMOPRO 2 VH 4000 (KITS) ×3 IMPLANT
LEAD PACING MYOCARDI (MISCELLANEOUS) ×3 IMPLANT
MARKER GRAFT CORONARY BYPASS (MISCELLANEOUS) ×9 IMPLANT
NS IRRIG 1000ML POUR BTL (IV SOLUTION) ×15 IMPLANT
PACK E OPEN HEART (SUTURE) ×3 IMPLANT
PACK OPEN HEART (CUSTOM PROCEDURE TRAY) ×3 IMPLANT
PAD ARMBOARD 7.5X6 YLW CONV (MISCELLANEOUS) ×6 IMPLANT
PAD ELECT DEFIB RADIOL ZOLL (MISCELLANEOUS) ×3 IMPLANT
PENCIL BUTTON HOLSTER BLD 10FT (ELECTRODE) ×3 IMPLANT
PUNCH AORTIC ROTATE 4.0MM (MISCELLANEOUS) ×3 IMPLANT
PUNCH AORTIC ROTATE 4.5MM 8IN (MISCELLANEOUS) IMPLANT
PUNCH AORTIC ROTATE 5MM 8IN (MISCELLANEOUS) IMPLANT
SET CARDIOPLEGIA MPS 5001102 (MISCELLANEOUS) ×3 IMPLANT
SOLUTION ANTI FOG 6CC (MISCELLANEOUS) IMPLANT
SPONGE LAP 18X18 RF (DISPOSABLE) IMPLANT
SPONGE LAP 18X18 X RAY DECT (DISPOSABLE) ×3 IMPLANT
SPONGE LAP 4X18 RFD (DISPOSABLE) ×3 IMPLANT
SUT BONE WAX W31G (SUTURE) ×3 IMPLANT
SUT ETHIBOND X763 2 0 SH 1 (SUTURE) ×6 IMPLANT
SUT MNCRL AB 3-0 PS2 18 (SUTURE) ×6 IMPLANT
SUT MNCRL AB 4-0 PS2 18 (SUTURE) IMPLANT
SUT PDS AB 1 CTX 36 (SUTURE) ×6 IMPLANT
SUT PROLENE 2 0 SH DA (SUTURE) IMPLANT
SUT PROLENE 3 0 SH DA (SUTURE) ×6 IMPLANT
SUT PROLENE 3 0 SH1 36 (SUTURE) IMPLANT
SUT PROLENE 4 0 RB 1 (SUTURE) ×2
SUT PROLENE 4 0 SH DA (SUTURE) IMPLANT
SUT PROLENE 4-0 RB1 .5 CRCL 36 (SUTURE) ×4 IMPLANT
SUT PROLENE 5 0 C 1 36 (SUTURE) IMPLANT
SUT PROLENE 6 0 C 1 30 (SUTURE) ×3 IMPLANT
SUT PROLENE 7.0 RB 3 (SUTURE) ×15 IMPLANT
SUT PROLENE 8 0 BV175 6 (SUTURE) IMPLANT
SUT PROLENE BLUE 7 0 (SUTURE) ×6 IMPLANT
SUT PROLENE POLY MONO (SUTURE) IMPLANT
SUT SILK  1 MH (SUTURE) ×1
SUT SILK 1 MH (SUTURE) ×2 IMPLANT
SUT STEEL 6MS V (SUTURE) IMPLANT
SUT STEEL STERNAL CCS#1 18IN (SUTURE) IMPLANT
SUT STEEL SZ 6 DBL 3X14 BALL (SUTURE) IMPLANT
SUT VIC AB 1 CTX 36 (SUTURE)
SUT VIC AB 1 CTX36XBRD ANBCTR (SUTURE) IMPLANT
SUT VIC AB 2-0 CT1 27 (SUTURE) ×1
SUT VIC AB 2-0 CT1 TAPERPNT 27 (SUTURE) ×2 IMPLANT
SUT VIC AB 2-0 CTX 27 (SUTURE) IMPLANT
SUT VIC AB 3-0 SH 27 (SUTURE)
SUT VIC AB 3-0 SH 27X BRD (SUTURE) IMPLANT
SUT VIC AB 3-0 X1 27 (SUTURE) IMPLANT
SUT VICRYL 4-0 PS2 18IN ABS (SUTURE) IMPLANT
SYSTEM SAHARA CHEST DRAIN ATS (WOUND CARE) ×3 IMPLANT
TAPE CLOTH SURG 4X10 WHT LF (GAUZE/BANDAGES/DRESSINGS) ×3 IMPLANT
TAPE PAPER 2X10 WHT MICROPORE (GAUZE/BANDAGES/DRESSINGS) ×3 IMPLANT
TOWEL GREEN STERILE (TOWEL DISPOSABLE) ×3 IMPLANT
TOWEL GREEN STERILE FF (TOWEL DISPOSABLE) ×3 IMPLANT
TRAY FOLEY SLVR 16FR TEMP STAT (SET/KITS/TRAYS/PACK) ×3 IMPLANT
TUBE SUCT INTRACARD DLP 20F (MISCELLANEOUS) ×3 IMPLANT
TUBING LAP HI FLOW INSUFFLATIO (TUBING) ×3 IMPLANT
UNDERPAD 30X30 (UNDERPADS AND DIAPERS) ×3 IMPLANT
WATER STERILE IRR 1000ML POUR (IV SOLUTION) ×6 IMPLANT

## 2018-08-27 NOTE — Transfer of Care (Signed)
Immediate Anesthesia Transfer of Care Note  Patient: Zachary [name]  Procedure(s) Performed: EXPLORATION POST-OP HEART FOR BLEEDING (N/A Chest)  Patient Location: SICU  Anesthesia Type:General  Level of Consciousness: sedated, unresponsive and Patient remains intubated per anesthesia plan  Airway & Oxygen Therapy: Patient remains intubated per anesthesia plan and Patient placed on Ventilator (see vital sign flow sheet for setting)  Post-op Assessment: Report given to RN and Post -op Vital signs reviewed and stable  Post vital signs: Reviewed and stable  Last Vitals:  Vitals Value Taken Time  BP    Temp 34.7 C 08/27/18 1848  Pulse 89 08/27/18 1848  Resp 12 08/27/18 1848  SpO2 93 % 08/27/18 1848  Vitals shown include unvalidated device data.  Last Pain:  Vitals:   08/27/18 0447  TempSrc: Oral  PainSc:          Complications: No apparent anesthesia complications

## 2018-08-27 NOTE — Anesthesia Procedure Notes (Signed)
Central Venous Catheter Insertion Performed by: Roberts Gaudy, MD, anesthesiologist Start/End6/24/2020 6:35 AM, 08/27/2018 6:45 AM Patient location: Pre-op. Preanesthetic checklist: patient identified, IV checked, site marked, risks and benefits discussed, surgical consent, monitors and equipment checked, pre-op evaluation, timeout performed and anesthesia consent Lidocaine 1% used for infiltration and patient sedated Hand hygiene performed  and maximum sterile barriers used  Catheter size: 9 Fr Sheath introducer Procedure performed using ultrasound guided technique. Ultrasound Notes:anatomy identified, needle tip was noted to be adjacent to the nerve/plexus identified, no ultrasound evidence of intravascular and/or intraneural injection and image(s) printed for medical record Attempts: 1 Following insertion, line sutured and dressing applied. Post procedure assessment: blood return through all ports, free fluid flow and no air  Patient tolerated the procedure well with no immediate complications.

## 2018-08-27 NOTE — Op Note (Signed)
CARDIOTHORACIC SURGERY OPERATIVE NOTE  Date of Procedure: 08/27/2018  Preoperative Diagnosis:   Severe 3-vessel Coronary Artery Disease  Unstable Angina Pectoris  Postoperative Diagnosis: Same  Procedure:    Coronary Artery Bypass Grafting x 4   Left Internal Mammary Artery to Distal Left Anterior Descending Coronary Artery  Saphenous Vein Graft to Distal Right Coronary Artery  Saphenous Vein Graft to First Obtuse Marginal Branch of Left Circumflex Coronary Artery  Sapheonous Vein Graft to Diagonal Branch Coronary Artery  Endoscopic Vein Harvest from Right Thigh and Lower Leg  Surgeon: Valentina Gu. Roxy Manns, MD  Assistant: B. Murvin Natal, MD  Anesthesia: Roderic Palau, MD  Operative Findings:  Normal left ventricular systolic function  Good quality left internal mammary artery conduit  Good quality saphenous vein conduit  Good quality target vessels for grafting    BRIEF CLINICAL NOTE AND INDICATIONS FOR SURGERY   Patient is a 78 year old male with history of hypertension but no previous history of coronary artery disease who presents with symptoms of unstable angina pectoris.  Baseline EKG reveals sinus rhythm with right bundle branch block and no obvious significant ST segment elevation.  Troponin levels were weakly positive, consistent with non-ST segment elevation myocardial infarction.  He has remained pain-free and clinically stable since hospitalization. The patient has been seen in consultation and counseled at length regarding the indications, risks and potential benefits of surgery.  All questions have been answered, and the patient provides full informed consent for the operation as described.     DETAILS OF THE OPERATIVE PROCEDURE  Preparation:  The patient is brought to the operating room on the above mentioned date and central monitoring was established by the anesthesia team including placement of Swan-Ganz catheter and radial arterial line. The  patient is placed in the supine position on the operating table.  Intravenous antibiotics are administered. General endotracheal anesthesia is induced uneventfully. A Foley catheter is placed.  Baseline transesophageal echocardiogram was performed.  Findings were notable for normal LV size and systolic function  The patient's chest, abdomen, both groins, and both lower extremities are prepared and draped in a sterile manner. A time out procedure is performed.   Surgical Approach and Conduit Harvest:  A median sternotomy incision was performed and the left internal mammary artery is dissected from the chest wall and prepared for bypass grafting. The left internal mammary artery is notably good quality conduit. Simultaneously, the greater saphenous vein is obtained from the patient's right thigh and leg using endoscopic vein harvest technique. The saphenous vein is notably good quality conduit. After removal of the saphenous vein, the small surgical incisions in the lower extremity are closed with absorbable suture. Following systemic heparinization, the left internal mammary artery was transected distally noted to have excellent flow.   Extracorporeal Cardiopulmonary Bypass and Myocardial Protection:  The pericardium is opened. The ascending aorta is normal in appearance. The ascending aorta and the right atrium are cannulated for cardiopulmonary bypass.  Adequate heparinization is verified.     The entire pre-bypass portion of the operation was notable for stable hemodynamics.  Cardiopulmonary bypass was begun and the surface of the heart is inspected. Distal target vessels are selected for coronary artery bypass grafting. A cardioplegia cannula is placed in the ascending aorta.  A temperature probe was placed in the interventricular septum.  The patient is allowed to cool passively to [facility] systemic temperature.  The aortic cross clamp is applied and cold blood cardioplegia is delivered initially  in an antegrade fashion through  the aortic root.  Iced saline slush is applied for topical hypothermia.  The initial cardioplegic arrest is rapid with early diastolic arrest.  Repeat doses of cardioplegia are administered intermittently throughout the entire cross clamp portion of the operation through the aortic root, and through subsequently placed vein grafts in order to maintain completely flat electrocardiogram and septal myocardial temperature below 15C.  Myocardial protection was felt to be excellent.   Coronary Artery Bypass Grafting:   The distal right coronary artery was grafted using a reversed saphenous vein graft in an end-to-side fashion.  At the site of distal anastomosis the target vessel was good quality and measured approximately 2.0 mm in diameter.  The first obtuse marginal branch of the left circumflex coronary artery was grafted using a reversed saphenous vein graft in an end-to-side fashion.  At the site of distal anastomosis the target vessel was good quality and measured approximately 2.2 mm in diameter.  The diagonal branch of the left anterior descending coronary artery was grafted using a reversed saphenous vein graft in an end-to-side fashion.  At the site of distal anastomosis the target vessel was good quality and measured approximately 1.8 mm in diameter.  The distal left anterior coronary artery was grafted with the left internal mammary artery in an end-to-side fashion.  At the site of distal anastomosis the target vessel was good quality and measured approximately 1.5 mm in diameter.  All proximal vein graft anastomoses were placed directly to the ascending aorta prior to removal of the aortic cross clamp.  The septal myocardial temperature rose rapidly after reperfusion of the left internal mammary artery graft.  The aortic cross clamp was removed after a total cross clamp time of 73 minutes.   Procedure Completion:  All proximal and distal coronary anastomoses  were inspected for hemostasis and appropriate graft orientation. Epicardial pacing wires are fixed to the right ventricular outflow tract and to the right atrial appendage. The patient is rewarmed to 37C temperature. The patient is weaned and disconnected from cardiopulmonary bypass.  The patient's rhythm at separation from bypass was AV paced.  The patient was weaned from cardiopulmonary bypass without any inotropic support. Total cardiopulmonary bypass time for the operation was 88 minutes.  Followup transesophageal echocardiogram performed after separation from bypass revealed no changes from the preoperative exam.  The aortic and venous cannula were removed uneventfully. Protamine was administered to reverse the anticoagulation. The mediastinum and pleural space were inspected for hemostasis and irrigated with saline solution. The mediastinum and the left pleural space were drained using 3 chest tubes placed through separate stab incisions inferiorly.  The soft tissues anterior to the aorta were reapproximated loosely. The sternum is closed with double strength sternal wire. The soft tissues anterior to the sternum were closed in multiple layers and the skin is closed with a running subcuticular skin closure.  The post-bypass portion of the operation was notable for stable rhythm and hemodynamics.  No blood products were administered during the operation.   Disposition:  The patient tolerated the procedure well and is transported to the surgical intensive care in stable condition. There are no intraoperative complications. All sponge instrument and needle counts are verified correct at completion of the operation.    Valentina Gu. Roxy Manns MD 08/27/2018 12:11 PM

## 2018-08-27 NOTE — Progress Notes (Addendum)
EVENING ROUNDS NOTE :     [address]       [place] [address]             [phone]                 Day of Surgery Procedure(s) (LRB): EXPLORATION POST-OP HEART FOR BLEEDING (N/A)  Total Length of Stay:  LOS: 2 days  BP 106/76   Pulse 90   Temp (!) 96.4 F (35.8 C)   Resp 12   Ht 5\' 3"  (1.6 m)   Wt 64 kg   SpO2 93%   BMI 25.01 kg/m   .Intake/Output      06/24 0701 - 06/25 0700   P.O.    I.V. (mL/kg) 4173.1 (65.2)   Blood 1852   IV Piggyback 898   Total Intake(mL/kg) 6923.1 (108.2)   Urine (mL/kg/hr) 2205 (2.8)   Blood 900   Chest Tube 720   Total Output 3825   Net +3098.1         . sodium chloride 10 mL/hr at 08/27/18 1857  . sodium chloride    . [START ON 08/28/2018] sodium chloride    . sodium chloride 10 mL/hr at 08/27/18 1858  . albumin human    . cefUROXime (ZINACEF)  IV    . dexmedetomidine (PRECEDEX) IV infusion 0.7 mcg/kg/hr (08/27/18 1847)  . dexmedetomidine    . famotidine (PEPCID) IV    . insulin 1.4 Units/hr (08/27/18 1832)  . lactated ringers    . lactated ringers    . lactated ringers    . magnesium sulfate    . nitroGLYCERIN    . phenylephrine (NEO-SYNEPHRINE) Adult infusion    . potassium chloride 10 mEq (08/27/18 1910)  . [START ON 08/28/2018] vancomycin        Back from OR for Re-exploration for post operative bleeding Remains sedated on vent 60 cc chest tube output, CXR with bilateral effusions/atelectasis Transfused 4 packed cells, factor 7 in OR Currently stable  Ellwood Handler, PA-C Office [phone] 08/27/2018 7:21 PM   I have seen and examined the patient and agree with the assessment and plan as outlined.  Stable hemodynamics, chest tube output low.  Starting to wake on vent.  Routine care.  [name], MD 08/27/2018 10:05 PM

## 2018-08-27 NOTE — Anesthesia Procedure Notes (Signed)
Procedure Name: Intubation Date/Time: 08/27/2018 7:56 AM Performed by: Dareon Nunziato T, CRNA Pre-anesthesia Checklist: Patient identified, Emergency Drugs available, Suction available and Patient being monitored Patient Re-evaluated:Patient Re-evaluated prior to induction Oxygen Delivery Method: Circle system utilized Preoxygenation: Pre-oxygenation with 100% oxygen Induction Type: IV induction and Rapid sequence Laryngoscope Size: Miller and 2 Grade View: Grade I Tube type: Oral Tube size: 8.0 mm Number of attempts: 1 Airway Equipment and Method: Patient positioned with wedge pillow and Stylet Placement Confirmation: ETT inserted through vocal cords under direct vision,  positive ETCO2 and breath sounds checked- equal and bilateral Secured at: 22 cm Tube secured with: Tape Dental Injury: Teeth and Oropharynx as per pre-operative assessment

## 2018-08-27 NOTE — Anesthesia Procedure Notes (Signed)
Arterial Line Insertion Start/End6/24/2020 7:00 AM, 08/27/2018 7:10 AM Performed by: Avien Taha T, Immunologist, CRNA  Patient location: Pre-op. Preanesthetic checklist: patient identified, IV checked, risks and benefits discussed, surgical consent, monitors and equipment checked, pre-op evaluation and timeout performed Lidocaine 1% used for infiltration and patient sedated Left, radial was placed Catheter size: 20 G Hand hygiene performed  and maximum sterile barriers used   Attempts: 3 Procedure performed without using ultrasound guided technique. Following insertion, dressing applied and Biopatch. Post procedure assessment: normal  Patient tolerated the procedure well with no immediate complications.

## 2018-08-27 NOTE — Transfer of Care (Signed)
Immediate Anesthesia Transfer of Care Note  Patient: Zachary [name]  Procedure(s) Performed: CORONARY ARTERY BYPASS GRAFTING (CABG) x four,  using left internal mammary artery and right leg greater saphenous vein harvested endoscopically (N/A Chest) TRANSESOPHAGEAL ECHOCARDIOGRAM (TEE) (N/A )  Patient Location: ICU  Anesthesia Type:General  Level of Consciousness: Patient remains intubated per anesthesia plan  Airway & Oxygen Therapy: Patient remains intubated per anesthesia plan and Patient placed on Ventilator (see vital sign flow sheet for setting)  Post-op Assessment: Report given to RN and Post -op Vital signs reviewed and stable  Post vital signs: Reviewed and stable  Last Vitals:  Vitals Value Taken Time  BP    Temp    Pulse    Resp    SpO2      Last Pain:  Vitals:   08/27/18 0447  TempSrc: Oral  PainSc:          Complications: No apparent anesthesia complications

## 2018-08-27 NOTE — Progress Notes (Signed)
TCTS BRIEF SICU PROGRESS NOTE  Patient has remained hemodynamically stable since return from OR, but over the past 2 hours he has had significantly increased chest tube drainage.  PT/aPTT are mildly prolonged and there is mild thrombocytopenia.  Plan: Will return to OR for mediastinal reexploration for bleeding.  Discussed w/ patient's wife over telephone.  [name], MD 08/27/2018 3:57 PM

## 2018-08-27 NOTE — Progress Notes (Signed)
      [address]       [place] [address]             [phone]     CARDIOTHORACIC SURGERY PROGRESS NOTE  Subjective: Zachary [name] has been scheduled for Procedure(s): LEFT HEART CATH AND CORONARY ANGIOGRAPHY (N/A) today.   Objective: Vital signs in last 24 hours: Temp:  [97.7 F (36.5 C)-98.6 F (37 C)] 97.7 F (36.5 C) (06/24 0447) Pulse Rate:  [53-78] 53 (06/24 0447) Cardiac Rhythm: Sinus bradycardia;Bundle branch block;Heart block (06/23 1900) Resp:  [8-22] 20 (06/24 0447) BP: (102-169)/(65-89) 114/66 (06/24 0447) SpO2:  [92 %-100 %] 93 % (06/24 0447)  Physical Exam: Unchanged from previously   Intake/Output from previous day: 06/23 0701 - 06/24 0700 In: 420 [P.O.:420] Out: 550 [Urine:550] Intake/Output this shift: Total I/O In: 240 [P.O.:240] Out: 350 [Urine:350]  Lab Results: Recent Labs    08/25/18 1135 08/26/18 0600  WBC 7.4 5.7  HGB 15.8 14.8  HCT 46.5 43.0  PLT 313 271   BMET:  Recent Labs    08/25/18 1135 08/26/18 0600  NA 140 138  K 3.6 3.0*  CL 100 102  CO2 30 25  GLUCOSE 110* 99  BUN 17 17  CREATININE 1.24 1.20  CALCIUM 9.7 9.2    CBG (last 3)  Recent Labs    08/25/18 1353  GLUCAP 80   PT/INR:  No results for input(s): LABPROT, INR in the last 72 hours.  Assessment/Plan:   The various methods of treatment have been discussed with the patient. After consideration of the risks, benefits and treatment options the patient has consented to the planned procedure.   The patient has been seen and labs reviewed. There are no changes in the patient's condition to prevent proceeding with the planned procedure today.   [name], MD 08/27/2018 5:33 AM

## 2018-08-27 NOTE — Op Note (Signed)
CARDIOTHORACIC SURGERY OPERATIVE NOTE  Date of Procedure:   08/27/2018  Preoperative Diagnosis:  Bleeding s/p CABG  Postoperative Diagnosis:  same  Procedure:    Mediastinal Reexploration for Bleeding  Surgeon:    Valentina Gu. Roxy Manns, MD  Assistant:    Alcide Evener, CRNFA  Anesthesia:    Arabella Merles, MD  Operative Findings:   Diffuse coagulopathy w/out clear mechanical source of bleeding     BRIEF CLINICAL NOTE AND INDICATIONS FOR SURGERY  Patient is a 78 year old male with history of hypertension who underwent elective coronary artery bypass grafting x4 on August 27, 2018 for severe three-vessel coronary artery disease with unstable angina.  The surgical procedure was uncomplicated.  Shortly after return to the intensive care unit the patient began to have increased bloody chest tube drainage.  This persisted for the first 2 to 3 hours.  Although the patient remained entirely stable from a hemodynamic standpoint, the volume of chest tube output was felt to be large enough and persistent enough to warrant surgical exploration.  The patient's wife was contacted via telephone and the patient brought directly to the operating room on the afternoon of August 27, 2018.     DETAILS OF THE OPERATIVE PROCEDURE  The patient is brought to the operating room on the above mentioned date and placed in the supine position on the operating table.  Adequate general endotracheal anesthesia is verified and maintained under the care and direction of Dr. Ola Spurr.  Intravenous antibiotics are administered.  The patient's anterior chest is prepared and draped in sterile manner.  A timeout procedure was performed.  The patient's previous median sternotomy incision is reopened.  The sternal wires are removed.  A retractor is placed.  The mediastinum was explored.  There is minimal amount of residual blood in no significant clot encountered.  The mediastinum and the left pleural space were irrigated  with copious warm saline solution.  There is diffuse oozing with no definitive mechanical sources of bleeding identified.  An exhaustive search for sites of mechanical bleeding is performed.  All of the vein grafts, the left internal mammary artery graft, and all other sites on the heart related to the surgical procedure carefully examined.  After an exhaustive search for mechanical bleeding there remains some degree of coagulopathy despite administration of fresh frozen plasma and platelets for moderate prolongation of prothrombin time and mild thrombocytopenia.  A small dose of recombinant human factor VII is subsequently administered.  Hemostasis improves.  The mediastinum and the left pleural space are again irrigated with saline solution.  The mediastinum and the left pleural space drained with the original 3 chest tubes.  Soft tissues anterior to the aorta are reapproximated loosely.  The sternum was closed using double strength sternal wire.  The soft tissues anterior to the sternum are closed in multiple layers and the skin is closed using running subcuticular skin closure.  The patient received 4 units packed red blood cells during the procedure due to acute blood loss anemia.  Patient is transported back to the surgical intensive care unit in stable condition.  There are no intraoperative complications.  All sponge instrument and needle counts are verified correct at completion of the operation.      Valentina Gu. Roxy Manns MD 08/27/2018 6:12 PM

## 2018-08-27 NOTE — Anesthesia Preprocedure Evaluation (Signed)
Anesthesia Evaluation  Patient identified by MRN, date of birth, ID band Patient unresponsive    Reviewed: Allergy & Precautions, H&P , NPO status , Patient's Chart, lab work & pertinent test results  Airway Mallampati: Intubated       Dental no notable dental hx. (+) Teeth Intact, Dental Advisory Given   Pulmonary neg pulmonary ROS, former smoker,    Pulmonary exam normal breath sounds clear to auscultation       Cardiovascular hypertension, Pt. on medications + CABG   Rhythm:Regular Rate:Normal     Neuro/Psych negative neurological ROS  negative psych ROS   GI/Hepatic negative GI ROS, Neg liver ROS,   Endo/Other  negative endocrine ROS  Renal/GU negative Renal ROS  negative genitourinary   Musculoskeletal   Abdominal   Peds  Hematology negative hematology ROS (+)   Anesthesia Other Findings   Reproductive/Obstetrics negative OB ROS                             Anesthesia Physical Anesthesia Plan  ASA: IV and emergent  Anesthesia Plan: General   Post-op Pain Management:    Induction: Intravenous  PONV Risk Score and Plan: 2 and Treatment may vary due to age or medical condition and Midazolam  Airway Management Planned: Oral ETT  Additional Equipment: TEE  Intra-op Plan:   Post-operative Plan: Post-operative intubation/ventilation  Informed Consent: I have reviewed the patients History and Physical, chart, labs and discussed the procedure including the risks, benefits and alternatives for the proposed anesthesia with the patient or authorized representative who has indicated his/her understanding and acceptance.     Dental advisory given  Plan Discussed with: CRNA  Anesthesia Plan Comments:         Anesthesia Quick Evaluation

## 2018-08-27 NOTE — Anesthesia Postprocedure Evaluation (Signed)
Anesthesia Post Note  Patient: Seanpatrick Maisano Knudtson  Procedure(s) Performed: CORONARY ARTERY BYPASS GRAFTING (CABG) x four,  using left internal mammary artery and right leg greater saphenous vein harvested endoscopically (N/A Chest) TRANSESOPHAGEAL ECHOCARDIOGRAM (TEE) (N/A )     Patient location during evaluation: SICU Anesthesia Type: General Level of consciousness: sedated Pain management: pain level controlled Vital Signs Assessment: post-procedure vital signs reviewed and stable Respiratory status: patient remains intubated per anesthesia plan Cardiovascular status: stable Postop Assessment: no apparent nausea or vomiting Anesthetic complications: no    Last Vitals:  Vitals:   08/27/18 0447 08/27/18 1232  BP: 114/66   Pulse: (!) 53 89  Resp: 20 12  Temp: 36.5 C   SpO2: 93% 97%    Last Pain:  Vitals:   08/27/18 0447  TempSrc: Oral  PainSc:                  Seren Chaloux,W. EDMOND

## 2018-08-27 NOTE — Anesthesia Procedure Notes (Signed)
Central Venous Catheter Insertion Performed by: Roberts Gaudy, MD, anesthesiologist Start/End6/24/2020 6:35 AM, 08/27/2018 6:45 AM Patient location: Pre-op. Preanesthetic checklist: patient identified, IV checked, site marked, risks and benefits discussed, surgical consent, monitors and equipment checked, pre-op evaluation, timeout performed and anesthesia consent Hand hygiene performed  and maximum sterile barriers used  PA cath was placed.Swan type:thermodilution Procedure performed without using ultrasound guided technique. Attempts: 1 Following insertion, line sutured, Biopatch and dressing applied. Post procedure assessment: blood return through all ports and free fluid flow  Patient tolerated the procedure well with no immediate complications.

## 2018-08-27 NOTE — Brief Op Note (Addendum)
08/25/2018 - 08/27/2018  11:02 AM  PATIENT:  Zachary [name]  78 y.o. male  PRE-OPERATIVE DIAGNOSIS:  CAD  POST-OPERATIVE DIAGNOSIS:  CAD  PROCEDURE:  Procedure(s): CORONARY ARTERY BYPASS GRAFTING (CABG) x four,  using left internal mammary artery and right leg greater saphenous vein harvested endoscopically  -LIMA->LAD -SVG->DIAG -SVG->OM -SVG->PLB of RCA  TRANSESOPHAGEAL ECHOCARDIOGRAM (TEE) (N/A)  SURGEON:  Surgeon(s) and Role:    [name], MD - Primary    * Wonda Olds, MD - Assisting  PHYSICIAN ASSISTANT: Roddednberry  ANESTHESIA:   general  EBL:  Per anesthesia and perfusion records   BLOOD ADMINISTERED:none  DRAINS: Mediastinal and left pleural tubes   LOCAL MEDICATIONS USED:  NONE  SPECIMEN:  No Specimen  DISPOSITION OF SPECIMEN:  N/A  COUNTS:  YES  DICTATION: .Dragon Dictation  PLAN OF CARE: Admit to inpatient   PATIENT DISPOSITION:  ICU - intubated and hemodynamically stable.   Delay start of Pharmacological VTE agent (>24hrs) due to surgical blood loss or risk of bleeding: yes

## 2018-08-28 ENCOUNTER — Encounter (HOSPITAL_COMMUNITY): Payer: Self-pay | Admitting: Thoracic Surgery (Cardiothoracic Vascular Surgery)

## 2018-08-28 ENCOUNTER — Inpatient Hospital Stay (HOSPITAL_COMMUNITY): Payer: PPO

## 2018-08-28 DIAGNOSIS — Z951 Presence of aortocoronary bypass graft: Secondary | ICD-10-CM

## 2018-08-28 LAB — POCT I-STAT 7, (LYTES, BLD GAS, ICA,H+H)
Acid-base deficit: 3 mmol/L — ABNORMAL HIGH (ref 0.0–2.0)
Acid-base deficit: 6 mmol/L — ABNORMAL HIGH (ref 0.0–2.0)
Acid-base deficit: 9 mmol/L — ABNORMAL HIGH (ref 0.0–2.0)
Acid-base deficit: 8 mmol/L — ABNORMAL HIGH (ref 0.0–2.0)
Acid-base deficit: 3 mmol/L — ABNORMAL HIGH (ref 0.0–2.0)
Acid-base deficit: 5 mmol/L — ABNORMAL HIGH (ref 0.0–2.0)
Bicarbonate: 17.9 mmol/L — ABNORMAL LOW (ref 20.0–28.0)
Bicarbonate: 20.8 mmol/L (ref 20.0–28.0)
Bicarbonate: 15.9 mmol/L — ABNORMAL LOW (ref 20.0–28.0)
Bicarbonate: 17.3 mmol/L — ABNORMAL LOW (ref 20.0–28.0)
Bicarbonate: 20 mmol/L (ref 20.0–28.0)
Bicarbonate: 21.3 mmol/L (ref 20.0–28.0)
Calcium, Ion: 1.06 mmol/L — ABNORMAL LOW (ref 1.15–1.40)
Calcium, Ion: 1.08 mmol/L — ABNORMAL LOW (ref 1.15–1.40)
Calcium, Ion: 1.07 mmol/L — ABNORMAL LOW (ref 1.15–1.40)
Calcium, Ion: 1.11 mmol/L — ABNORMAL LOW (ref 1.15–1.40)
Calcium, Ion: 0.9 mmol/L — ABNORMAL LOW (ref 1.15–1.40)
Calcium, Ion: 0.96 mmol/L — ABNORMAL LOW (ref 1.15–1.40)
HCT: 29 % — ABNORMAL LOW (ref 39.0–52.0)
HCT: 29 % — ABNORMAL LOW (ref 39.0–52.0)
HCT: 29 % — ABNORMAL LOW (ref 39.0–52.0)
HCT: 31 % — ABNORMAL LOW (ref 39.0–52.0)
HCT: 21 % — ABNORMAL LOW (ref 39.0–52.0)
HCT: 33 % — ABNORMAL LOW (ref 39.0–52.0)
Hemoglobin: 9.9 g/dL — ABNORMAL LOW (ref 13.0–17.0)
Hemoglobin: 9.9 g/dL — ABNORMAL LOW (ref 13.0–17.0)
Hemoglobin: 9.9 g/dL — ABNORMAL LOW (ref 13.0–17.0)
Hemoglobin: 10.5 g/dL — ABNORMAL LOW (ref 13.0–17.0)
Hemoglobin: 11.2 g/dL — ABNORMAL LOW (ref 13.0–17.0)
Hemoglobin: 7.1 g/dL — ABNORMAL LOW (ref 13.0–17.0)
O2 Saturation: 89 %
O2 Saturation: 91 %
O2 Saturation: 91 %
O2 Saturation: 91 %
O2 Saturation: 95 %
O2 Saturation: 98 %
Patient temperature: 37.2
Patient temperature: 37.4
Patient temperature: 37
Patient temperature: 36.8
Potassium: 4 mmol/L (ref 3.5–5.1)
Potassium: 4.2 mmol/L (ref 3.5–5.1)
Potassium: 3.9 mmol/L (ref 3.5–5.1)
Potassium: 3.8 mmol/L (ref 3.5–5.1)
Potassium: 3.4 mmol/L — ABNORMAL LOW (ref 3.5–5.1)
Potassium: 3.9 mmol/L (ref 3.5–5.1)
Sodium: 143 mmol/L (ref 135–145)
Sodium: 144 mmol/L (ref 135–145)
Sodium: 144 mmol/L (ref 135–145)
Sodium: 144 mmol/L (ref 135–145)
Sodium: 140 mmol/L (ref 135–145)
Sodium: 144 mmol/L (ref 135–145)
TCO2: 19 mmol/L — ABNORMAL LOW (ref 22–32)
TCO2: 22 mmol/L (ref 22–32)
TCO2: 17 mmol/L — ABNORMAL LOW (ref 22–32)
TCO2: 18 mmol/L — ABNORMAL LOW (ref 22–32)
TCO2: 21 mmol/L — ABNORMAL LOW (ref 22–32)
TCO2: 22 mmol/L (ref 22–32)
pCO2 arterial: 28.7 mmHg — ABNORMAL LOW (ref 32.0–48.0)
pCO2 arterial: 32.7 mmHg (ref 32.0–48.0)
pCO2 arterial: 28.4 mmHg — ABNORMAL LOW (ref 32.0–48.0)
pCO2 arterial: 32.9 mmHg (ref 32.0–48.0)
pCO2 arterial: 33.7 mmHg (ref 32.0–48.0)
pCO2 arterial: 36.3 mmHg (ref 32.0–48.0)
pH, Arterial: 7.405 (ref 7.350–7.450)
pH, Arterial: 7.413 (ref 7.350–7.450)
pH, Arterial: 7.355 (ref 7.350–7.450)
pH, Arterial: 7.33 — ABNORMAL LOW (ref 7.350–7.450)
pH, Arterial: 7.349 — ABNORMAL LOW (ref 7.350–7.450)
pH, Arterial: 7.409 (ref 7.350–7.450)
pO2, Arterial: 57 mmHg — ABNORMAL LOW (ref 83.0–108.0)
pO2, Arterial: 61 mmHg — ABNORMAL LOW (ref 83.0–108.0)
pO2, Arterial: 63 mmHg — ABNORMAL LOW (ref 83.0–108.0)
pO2, Arterial: 65 mmHg — ABNORMAL LOW (ref 83.0–108.0)
pO2, Arterial: 105 mmHg (ref 83.0–108.0)
pO2, Arterial: 80 mmHg — ABNORMAL LOW (ref 83.0–108.0)

## 2018-08-28 LAB — CBC
HCT: 30.4 % — ABNORMAL LOW (ref 39.0–52.0)
HCT: 31.7 % — ABNORMAL LOW (ref 39.0–52.0)
HCT: 33.4 % — ABNORMAL LOW (ref 39.0–52.0)
Hemoglobin: 10.8 g/dL — ABNORMAL LOW (ref 13.0–17.0)
Hemoglobin: 10.8 g/dL — ABNORMAL LOW (ref 13.0–17.0)
Hemoglobin: 11.7 g/dL — ABNORMAL LOW (ref 13.0–17.0)
MCH: 30.9 pg (ref 26.0–34.0)
MCH: 31.2 pg (ref 26.0–34.0)
MCH: 31.5 pg (ref 26.0–34.0)
MCHC: 34.1 g/dL (ref 30.0–36.0)
MCHC: 35 g/dL (ref 30.0–36.0)
MCHC: 35.5 g/dL (ref 30.0–36.0)
MCV: 88.6 fL (ref 80.0–100.0)
MCV: 89.1 fL (ref 80.0–100.0)
MCV: 90.6 fL (ref 80.0–100.0)
Platelets: 134 10*3/uL — ABNORMAL LOW (ref 150–400)
Platelets: 141 10*3/uL — ABNORMAL LOW (ref 150–400)
Platelets: 142 10*3/uL — ABNORMAL LOW (ref 150–400)
RBC: 3.43 MIL/uL — ABNORMAL LOW (ref 4.22–5.81)
RBC: 3.5 MIL/uL — ABNORMAL LOW (ref 4.22–5.81)
RBC: 3.75 MIL/uL — ABNORMAL LOW (ref 4.22–5.81)
RDW: 12.8 % (ref 11.5–15.5)
RDW: 13.6 % (ref 11.5–15.5)
RDW: 13.9 % (ref 11.5–15.5)
WBC: 11.8 10*3/uL — ABNORMAL HIGH (ref 4.0–10.5)
WBC: 15.8 10*3/uL — ABNORMAL HIGH (ref 4.0–10.5)
WBC: 8.7 10*3/uL (ref 4.0–10.5)
nRBC: 0 % (ref 0.0–0.2)
nRBC: 0 % (ref 0.0–0.2)
nRBC: 0 % (ref 0.0–0.2)

## 2018-08-28 LAB — POCT I-STAT, CHEM 8
BUN: 12 mg/dL (ref 8–23)
Calcium, Ion: 1.04 mmol/L — ABNORMAL LOW (ref 1.15–1.40)
Chloride: 108 mmol/L (ref 98–111)
Creatinine, Ser: 0.9 mg/dL (ref 0.61–1.24)
Glucose, Bld: 106 mg/dL — ABNORMAL HIGH (ref 70–99)
HCT: 29 % — ABNORMAL LOW (ref 39.0–52.0)
Hemoglobin: 9.9 g/dL — ABNORMAL LOW (ref 13.0–17.0)
Potassium: 3.7 mmol/L (ref 3.5–5.1)
Sodium: 143 mmol/L (ref 135–145)
TCO2: 22 mmol/L (ref 22–32)

## 2018-08-28 LAB — POCT I-STAT 4, (NA,K, GLUC, HGB,HCT)
Glucose, Bld: 117 mg/dL — ABNORMAL HIGH (ref 70–99)
HCT: 22 % — ABNORMAL LOW (ref 39.0–52.0)
Hemoglobin: 7.5 g/dL — ABNORMAL LOW (ref 13.0–17.0)
Potassium: 3.3 mmol/L — ABNORMAL LOW (ref 3.5–5.1)
Sodium: 143 mmol/L (ref 135–145)

## 2018-08-28 LAB — BASIC METABOLIC PANEL
Anion gap: 12 (ref 5–15)
Anion gap: 11 (ref 5–15)
BUN: 13 mg/dL (ref 8–23)
BUN: 16 mg/dL (ref 8–23)
CO2: 19 mmol/L — ABNORMAL LOW (ref 22–32)
CO2: 25 mmol/L (ref 22–32)
Calcium: 7.6 mg/dL — ABNORMAL LOW (ref 8.9–10.3)
Calcium: 8 mg/dL — ABNORMAL LOW (ref 8.9–10.3)
Chloride: 112 mmol/L — ABNORMAL HIGH (ref 98–111)
Chloride: 105 mmol/L (ref 98–111)
Creatinine, Ser: 1.2 mg/dL (ref 0.61–1.24)
Creatinine, Ser: 1.43 mg/dL — ABNORMAL HIGH (ref 0.61–1.24)
GFR calc Af Amer: >60 mL/min (ref >60)
GFR calc Af Amer: 54 mL/min — ABNORMAL LOW (ref >60)
GFR calc non Af Amer: 58 mL/min — ABNORMAL LOW (ref >60)
GFR calc non Af Amer: 47 mL/min — ABNORMAL LOW (ref >60)
Glucose, Bld: 144 mg/dL — ABNORMAL HIGH (ref 70–99)
Glucose, Bld: 161 mg/dL — ABNORMAL HIGH (ref 70–99)
Potassium: 3.8 mmol/L (ref 3.5–5.1)
Potassium: 2.9 mmol/L — ABNORMAL LOW (ref 3.5–5.1)
Sodium: 143 mmol/L (ref 135–145)
Sodium: 141 mmol/L (ref 135–145)

## 2018-08-28 LAB — GLUCOSE, CAPILLARY
Glucose-Capillary: 106 mg/dL — ABNORMAL HIGH (ref 70–99)
Glucose-Capillary: 106 mg/dL — ABNORMAL HIGH (ref 70–99)
Glucose-Capillary: 121 mg/dL — ABNORMAL HIGH (ref 70–99)
Glucose-Capillary: 131 mg/dL — ABNORMAL HIGH (ref 70–99)
Glucose-Capillary: 140 mg/dL — ABNORMAL HIGH (ref 70–99)
Glucose-Capillary: 150 mg/dL — ABNORMAL HIGH (ref 70–99)
Glucose-Capillary: 193 mg/dL — ABNORMAL HIGH (ref 70–99)
Glucose-Capillary: 195 mg/dL — ABNORMAL HIGH (ref 70–99)

## 2018-08-28 LAB — BPAM FFP
Blood Product Expiration Date: 202006242359
Blood Product Expiration Date: 202006242359
ISSUE DATE / TIME: 202006241434
ISSUE DATE / TIME: 202006241434
Unit Type and Rh: 6200
Unit Type and Rh: 6200

## 2018-08-28 LAB — PREPARE PLATELET PHERESIS
Unit division: 0
Unit division: 0
Unit division: 0

## 2018-08-28 LAB — BPAM PLATELET PHERESIS
Blood Product Expiration Date: 202006252359
Blood Product Expiration Date: 202006252359
Blood Product Expiration Date: 202006262359
ISSUE DATE / TIME: 202006241434
ISSUE DATE / TIME: 202006241557
Unit Type and Rh: 600
Unit Type and Rh: 6200
Unit Type and Rh: 6200

## 2018-08-28 LAB — PREPARE FRESH FROZEN PLASMA
Unit division: 0
Unit division: 0

## 2018-08-28 LAB — CREATININE, SERUM
Creatinine, Ser: 0.97 mg/dL (ref 0.61–1.24)
GFR calc Af Amer: >60 mL/min (ref >60)
GFR calc non Af Amer: >60 mL/min (ref >60)

## 2018-08-28 LAB — MAGNESIUM: Magnesium: 2.7 mg/dL — ABNORMAL HIGH (ref 1.7–2.4)

## 2018-08-28 MED ORDER — SODIUM BICARBONATE 8.4 % IV SOLN
50.0000 meq | Freq: Once | INTRAVENOUS | Status: AC
  Administered 2018-08-28: 50 meq via INTRAVENOUS

## 2018-08-28 MED ORDER — CHLORHEXIDINE GLUCONATE CLOTH 2 % EX PADS
6.0000 | MEDICATED_PAD | Freq: Every day | CUTANEOUS | Status: DC
  Administered 2018-08-28 – 2018-08-31 (×4): 6 via TOPICAL

## 2018-08-28 MED ORDER — ASPIRIN EC 325 MG PO TBEC
325.0000 mg | DELAYED_RELEASE_TABLET | Freq: Every day | ORAL | Status: AC
  Administered 2018-08-28: 325 mg via ORAL
  Filled 2018-08-28: qty 1

## 2018-08-28 MED ORDER — ATORVASTATIN CALCIUM 80 MG PO TABS
80.0000 mg | ORAL_TABLET | Freq: Every day | ORAL | Status: DC
  Administered 2018-08-29 – 2018-09-01 (×4): 80 mg via ORAL
  Filled 2018-08-28 (×4): qty 1

## 2018-08-28 MED ORDER — ASPIRIN EC 81 MG PO TBEC
81.0000 mg | DELAYED_RELEASE_TABLET | Freq: Every day | ORAL | Status: DC
  Administered 2018-08-29 – 2018-09-02 (×5): 81 mg via ORAL
  Filled 2018-08-28 (×6): qty 1

## 2018-08-28 MED ORDER — INSULIN ASPART 100 UNIT/ML ~~LOC~~ SOLN
0.0000 [IU] | SUBCUTANEOUS | Status: DC
  Administered 2018-08-28 (×2): 2 [IU] via SUBCUTANEOUS
  Administered 2018-08-28: 4 [IU] via SUBCUTANEOUS

## 2018-08-28 MED ORDER — POTASSIUM CHLORIDE 10 MEQ/50ML IV SOLN: 10.0000 meq | INTRAVENOUS | Status: DC

## 2018-08-28 MED ORDER — AMIODARONE HCL 200 MG PO TABS
400.0000 mg | ORAL_TABLET | Freq: Two times a day (BID) | ORAL | Status: DC
  Administered 2018-08-28: 23:00:00 400 mg via ORAL
  Filled 2018-08-28: qty 2

## 2018-08-28 MED ORDER — PROMETHAZINE HCL 25 MG/ML IJ SOLN
6.2500 mg | Freq: Four times a day (QID) | INTRAMUSCULAR | Status: DC | PRN
  Administered 2018-08-28: 6.25 mg via INTRAVENOUS
  Filled 2018-08-28: qty 1

## 2018-08-28 MED ORDER — FUROSEMIDE 10 MG/ML IJ SOLN
8.0000 mg/h | INTRAVENOUS | Status: AC
  Administered 2018-08-28: 8 mg/h via INTRAVENOUS
  Filled 2018-08-28 (×3): qty 25

## 2018-08-28 MED ORDER — POTASSIUM CHLORIDE 10 MEQ/50ML IV SOLN
INTRAVENOUS | Status: AC
  Filled 2018-08-28: qty 50

## 2018-08-28 MED ORDER — POTASSIUM CHLORIDE 10 MEQ/50ML IV SOLN
10.0000 meq | INTRAVENOUS | Status: AC
  Administered 2018-08-28 (×3): 10 meq via INTRAVENOUS
  Filled 2018-08-28 (×4): qty 50

## 2018-08-28 MED ORDER — ASPIRIN EC 81 MG PO TBEC: 81.0000 mg | DELAYED_RELEASE_TABLET | ORAL | Status: DC

## 2018-08-28 MED ORDER — FUROSEMIDE 10 MG/ML IJ SOLN
20.0000 mg | Freq: Once | INTRAMUSCULAR | Status: AC
  Administered 2018-08-28: 20 mg via INTRAVENOUS

## 2018-08-28 MED ORDER — INSULIN ASPART 100 UNIT/ML ~~LOC~~ SOLN
0.0000 [IU] | SUBCUTANEOUS | Status: DC
  Administered 2018-08-28: 2 [IU] via SUBCUTANEOUS
  Administered 2018-08-28: 4 [IU] via SUBCUTANEOUS
  Administered 2018-08-28 – 2018-08-29 (×2): 2 [IU] via SUBCUTANEOUS
  Administered 2018-08-29: 4 [IU] via SUBCUTANEOUS
  Administered 2018-08-30 – 2018-08-31 (×3): 2 [IU] via SUBCUTANEOUS

## 2018-08-28 MED ORDER — CLOPIDOGREL BISULFATE 75 MG PO TABS
75.0000 mg | ORAL_TABLET | Freq: Every day | ORAL | Status: DC
  Administered 2018-08-29 – 2018-09-02 (×5): 75 mg via ORAL
  Filled 2018-08-28 (×5): qty 1

## 2018-08-28 MED ORDER — POTASSIUM CHLORIDE 10 MEQ/50ML IV SOLN
10.0000 meq | INTRAVENOUS | Status: AC
  Filled 2018-08-28: qty 50

## 2018-08-28 NOTE — Progress Notes (Signed)
Weaning process attempted patient didn't pass due to ABG results PO2 is low.  Patient returned to original setting and we will attempt again in a couple of hours.

## 2018-08-28 NOTE — Plan of Care (Signed)
  Problem: Education: Goal: Knowledge of General Education information will improve Description: Including pain rating scale, medication(s)/side effects and non-pharmacologic comfort measures Outcome: Progressing   Problem: Clinical Measurements: Goal: Respiratory complications will improve Outcome: Progressing Goal: Cardiovascular complication will be avoided Outcome: Progressing   Problem: Coping: Goal: Level of anxiety will decrease Outcome: Progressing   Problem: Elimination: Goal: Will not experience complications related to urinary retention Outcome: Progressing   Problem: Pain Managment: Goal: General experience of comfort will improve Outcome: Progressing   Problem: Safety: Goal: Ability to remain free from injury will improve Outcome: Progressing   

## 2018-08-28 NOTE — Anesthesia Postprocedure Evaluation (Signed)
Anesthesia Post Note  Patient: Zachary [name]  Procedure(s) Performed: EXPLORATION POST-OP HEART FOR BLEEDING (N/A Chest)     Patient location during evaluation: SICU Anesthesia Type: General Level of consciousness: awake and alert Pain management: pain level controlled Vital Signs Assessment: post-procedure vital signs reviewed and stable Respiratory status: respiratory function stable and patient connected to nasal cannula oxygen Cardiovascular status: stable Postop Assessment: no apparent nausea or vomiting Anesthetic complications: no    Last Vitals:  Vitals:   08/28/18 0809 08/28/18 1125  BP:    Pulse:    Resp:    Temp: 36.8 C 36.5 C  SpO2:      Last Pain:  Vitals:   08/28/18 1125  TempSrc: Oral  PainSc:                  Hortencia Martire,W. EDMOND

## 2018-08-28 NOTE — Procedures (Signed)
Extubation Procedure Note  Patient Details:   Name: Zachary [name] DOB: [birth year] MRN: [id number]   Airway Documentation:  Airway 8 mm (Active)  Secured at (cm) 22 cm 08/27/18 2303  Measured From Lips 08/27/18 2303  Secured Location Center 08/27/18 1944  Secured By [employer] 08/27/18 2303  Site Condition Dry 08/27/18 2303   Vent end date: 08/27/18 Vent end time: 1232   Evaluation  O2 sats: stable throughout Complications: No apparent complications Patient did tolerate procedure well. Bilateral Breath Sounds: Clear   Yes  Wyman Songster [facility] 08/28/2018, 4:24 AM

## 2018-08-28 NOTE — Progress Notes (Signed)
Prior to extubation NIF -27 VC 750

## 2018-08-28 NOTE — Progress Notes (Addendum)
1 Day Post-Op Procedure(s) (LRB): EXPLORATION POST-OP HEART FOR BLEEDING (N/A) Subjective: Extubated ~4am. Awake and alert, He says pain control is adequate.  O2 requirement has gradually increased since extubation from 4L/min to 12L HF nasal cannula.  Objective: Vital signs in last 24 hours: Temp:  [94.6 F (34.8 C)-99.7 F (37.6 C)] 98.1 F (36.7 C) (06/25 0630) Pulse Rate:  [88-90] 88 (06/25 0630) Cardiac Rhythm: Atrial paced (06/24 2000) Resp:  [12-31] 23 (06/25 0630) BP: (64-136)/(45-88) 132/84 (06/25 0630) SpO2:  [86 %-100 %] 86 % (06/25 0630) Arterial Line BP: (65-126)/(47-99) 95/83 (06/25 0630) FiO2 (%):  [40 %-50 %] 50 % (06/25 0055) Weight:  [75.3 kg] 75.3 kg (06/25 0530)  Hemodynamic parameters for last 24 hours: PAP: (19-46)/(0-29) 46/29 CO:  [2.1 L/min-3.2 L/min] 3.2 L/min CI:  [1.2 L/min/m2-1.9 L/min/m2] 1.9 L/min/m2  Intake/Output from previous day: 06/24 0701 - 06/25 0700 In: 10375.1 [I.V.:6127.5; Blood:1852; IV Piggyback:2395.5] Out: 4805 [Urine:2740; Blood:900; Chest DDUK:0254] Intake/Output this shift: No intake/output data recorded.  General appearance: alert, cooperative and mild distress Neurologic: intact Heart: Sinus bradycardia with 1st degree AV block. Currently paced AAI @90 /min with good capture. Lungs: Breath sounds clear anterior, CT drainage tapering off and now more serous.  Abdomen: No bowel sounds, firm but not tender.  Extremities: mild edema throughout, all are well perfused. Wound: Sternal and RLE EVH incisions are covered with dry surgical dressings.  Lab Results: Recent Labs    08/27/18 2334  08/28/18 0541 08/28/18 0605  WBC 8.7  --  11.8*  --   HGB 10.8*   < > 10.8* 10.5*  HCT 30.4*   < > 31.7* 31.0*  PLT 134*  --  142*  --    < > = values in this interval not displayed.   BMET:  Recent Labs    08/27/18 0522  08/27/18 2325 08/27/18 2334  08/28/18 0541 08/28/18 0605  NA 139   < > 143  --    < > 143 144  K 3.5   < >  3.7  --    < > 3.8 3.8  CL 103  --  108  --   --  112*  --   CO2 25  --   --   --   --  19*  --   GLUCOSE 109*   < > 106*  --   --  144*  --   BUN 20  --  12  --   --  13  --   CREATININE 1.39*  --  0.90 0.97  --  1.20  --   CALCIUM 9.2  --   --   --   --  7.6*  --    < > = values in this interval not displayed.    PT/INR:  Recent Labs    08/27/18 1236  LABPROT 19.4*  INR 1.7*   ABG    Component Value Date/Time   PHART 7.330 (L) 08/28/2018 0605   HCO3 17.3 (L) 08/28/2018 0605   TCO2 18 (L) 08/28/2018 0605   ACIDBASEDEF 8.0 (H) 08/28/2018 0605   O2SAT 91.0 08/28/2018 0605   CBG (last 3)  Recent Labs    08/27/18 2309 08/28/18 0004 08/28/18 0404  GLUCAP 106* 106* 121*    Assessment/Plan: S/P Procedure(s) (LRB): EXPLORATION POST-OP HEART FOR BLEEDING (N/A)    LOS: 3 days   -POD1 CABG x 4 for MVCAD, acute NSTEMI, EF 50%. Re-op for post-op bleeding related to coagulopathy. No surgical bleed  identified. Coagulopathy corrected with blood products and Factor VII. D/C a-line and PA catheter.   -Respiratory insufficiency secondary to significant volume overload. Diurese with Lasix drip. Mobilize, encourage IS.  -Expected acute blood loss anemia- appropriate response to transfused PRBC's, Minimal ongoing losses. Monitor.  -Bradycardia- avoid beta-blockers for now. Continue a-pacing.  -DVT PPX--SCD's, add Lovenox tomorrow.   Antony Odea, PA-C [phone] 08/28/2018    I have seen and examined the patient and agree with the assessment and plan as outlined.  Doing well POD1.  Needs diuresis and mobilization with improved pulmonary toilet for significant but expected post op volume excess and bibasilar atelectasis.  Start beta blocker today, add DAPT and statin tomorrow.   [name], MD 08/28/2018 8:15 AM

## 2018-08-28 NOTE — Progress Notes (Signed)
      [address]       [place] [address]             [phone]      Nausea, emesis x 1  BP 138/77   Pulse 76   Temp 97.8 F (36.6 C) (Oral)   Resp (!) 22   Ht 5\' 3"  (1.6 m)   Wt 75.3 kg   SpO2 96%   BMI 29.41 kg/m   I/O - 1896 so far today  CBG elevated at 195 PM labs pending  Remo Lipps C. Roxan Hockey, MD [facility] [phone]

## 2018-08-29 ENCOUNTER — Inpatient Hospital Stay (HOSPITAL_COMMUNITY): Payer: PPO

## 2018-08-29 DIAGNOSIS — I471 Supraventricular tachycardia: Secondary | ICD-10-CM | POA: Diagnosis not present

## 2018-08-29 LAB — CBC
HCT: 33.2 % — ABNORMAL LOW (ref 39.0–52.0)
Hemoglobin: 11.5 g/dL — ABNORMAL LOW (ref 13.0–17.0)
MCH: 31.2 pg (ref 26.0–34.0)
MCHC: 34.6 g/dL (ref 30.0–36.0)
MCV: 90 fL (ref 80.0–100.0)
Platelets: 142 10*3/uL — ABNORMAL LOW (ref 150–400)
RBC: 3.69 MIL/uL — ABNORMAL LOW (ref 4.22–5.81)
RDW: 13.9 % (ref 11.5–15.5)
WBC: 16.4 10*3/uL — ABNORMAL HIGH (ref 4.0–10.5)
nRBC: 0 % (ref 0.0–0.2)

## 2018-08-29 LAB — BASIC METABOLIC PANEL
Anion gap: 10 (ref 5–15)
Anion gap: 9 (ref 5–15)
Anion gap: 11 (ref 5–15)
Anion gap: 12 (ref 5–15)
BUN: 18 mg/dL (ref 8–23)
BUN: 19 mg/dL (ref 8–23)
BUN: 20 mg/dL (ref 8–23)
BUN: 21 mg/dL (ref 8–23)
CO2: 25 mmol/L (ref 22–32)
CO2: 27 mmol/L (ref 22–32)
CO2: 28 mmol/L (ref 22–32)
CO2: 27 mmol/L (ref 22–32)
Calcium: 7.8 mg/dL — ABNORMAL LOW (ref 8.9–10.3)
Calcium: 7.9 mg/dL — ABNORMAL LOW (ref 8.9–10.3)
Calcium: 7.9 mg/dL — ABNORMAL LOW (ref 8.9–10.3)
Calcium: 7.7 mg/dL — ABNORMAL LOW (ref 8.9–10.3)
Chloride: 106 mmol/L (ref 98–111)
Chloride: 107 mmol/L (ref 98–111)
Chloride: 103 mmol/L (ref 98–111)
Chloride: 99 mmol/L (ref 98–111)
Creatinine, Ser: 1.38 mg/dL — ABNORMAL HIGH (ref 0.61–1.24)
Creatinine, Ser: 1.38 mg/dL — ABNORMAL HIGH (ref 0.61–1.24)
Creatinine, Ser: 1.61 mg/dL — ABNORMAL HIGH (ref 0.61–1.24)
Creatinine, Ser: 1.62 mg/dL — ABNORMAL HIGH (ref 0.61–1.24)
GFR calc Af Amer: 56 mL/min — ABNORMAL LOW (ref >60)
GFR calc Af Amer: 56 mL/min — ABNORMAL LOW (ref >60)
GFR calc Af Amer: 47 mL/min — ABNORMAL LOW (ref >60)
GFR calc Af Amer: 46 mL/min — ABNORMAL LOW (ref >60)
GFR calc non Af Amer: 49 mL/min — ABNORMAL LOW (ref >60)
GFR calc non Af Amer: 49 mL/min — ABNORMAL LOW (ref >60)
GFR calc non Af Amer: 40 mL/min — ABNORMAL LOW (ref >60)
GFR calc non Af Amer: 40 mL/min — ABNORMAL LOW (ref >60)
Glucose, Bld: 135 mg/dL — ABNORMAL HIGH (ref 70–99)
Glucose, Bld: 119 mg/dL — ABNORMAL HIGH (ref 70–99)
Glucose, Bld: 151 mg/dL — ABNORMAL HIGH (ref 70–99)
Glucose, Bld: 138 mg/dL — ABNORMAL HIGH (ref 70–99)
Potassium: 3.3 mmol/L — ABNORMAL LOW (ref 3.5–5.1)
Potassium: 3.4 mmol/L — ABNORMAL LOW (ref 3.5–5.1)
Potassium: 2.7 mmol/L — CL (ref 3.5–5.1)
Potassium: 2.5 mmol/L — CL (ref 3.5–5.1)
Sodium: 141 mmol/L (ref 135–145)
Sodium: 143 mmol/L (ref 135–145)
Sodium: 142 mmol/L (ref 135–145)
Sodium: 138 mmol/L (ref 135–145)

## 2018-08-29 LAB — POCT I-STAT 4, (NA,K, GLUC, HGB,HCT)
Glucose, Bld: 76 mg/dL (ref 70–99)
HCT: 30 % — ABNORMAL LOW (ref 39.0–52.0)
Hemoglobin: 10.2 g/dL — ABNORMAL LOW (ref 13.0–17.0)
Potassium: 3.8 mmol/L (ref 3.5–5.1)
Sodium: 136 mmol/L (ref 135–145)

## 2018-08-29 LAB — GLUCOSE, CAPILLARY
Glucose-Capillary: 116 mg/dL — ABNORMAL HIGH (ref 70–99)
Glucose-Capillary: 180 mg/dL — ABNORMAL HIGH (ref 70–99)
Glucose-Capillary: 114 mg/dL — ABNORMAL HIGH (ref 70–99)
Glucose-Capillary: 125 mg/dL — ABNORMAL HIGH (ref 70–99)
Glucose-Capillary: 134 mg/dL — ABNORMAL HIGH (ref 70–99)

## 2018-08-29 LAB — MAGNESIUM: Magnesium: 2.3 mg/dL (ref 1.7–2.4)

## 2018-08-29 MED ORDER — AMIODARONE HCL IN DEXTROSE 360-4.14 MG/200ML-% IV SOLN
30.0000 mg/h | INTRAVENOUS | Status: DC
  Administered 2018-08-29 – 2018-08-30 (×2): 30 mg/h via INTRAVENOUS
  Filled 2018-08-29 (×2): qty 200

## 2018-08-29 MED ORDER — MAGNESIUM SULFATE 2 GM/50ML IV SOLN
2.0000 g | Freq: Once | INTRAVENOUS | Status: AC
  Administered 2018-08-29: 2 g via INTRAVENOUS
  Filled 2018-08-29: qty 50

## 2018-08-29 MED ORDER — POTASSIUM CHLORIDE 10 MEQ/50ML IV SOLN
10.0000 meq | INTRAVENOUS | Status: AC
  Administered 2018-08-29 (×3): 10 meq via INTRAVENOUS

## 2018-08-29 MED ORDER — POTASSIUM CHLORIDE 10 MEQ/50ML IV SOLN
10.0000 meq | INTRAVENOUS | Status: AC
  Administered 2018-08-29 (×2): 10 meq via INTRAVENOUS
  Filled 2018-08-29: qty 50

## 2018-08-29 MED ORDER — MOVING RIGHT ALONG BOOK
Freq: Once | Status: DC
  Filled 2018-08-29: qty 1

## 2018-08-29 MED ORDER — AMIODARONE HCL IN DEXTROSE 360-4.14 MG/200ML-% IV SOLN
60.0000 mg/h | INTRAVENOUS | Status: AC
  Administered 2018-08-29: 09:00:00 60 mg/h via INTRAVENOUS
  Filled 2018-08-29: qty 200

## 2018-08-29 MED ORDER — POTASSIUM CHLORIDE 10 MEQ/50ML IV SOLN
10.0000 meq | INTRAVENOUS | Status: AC
  Administered 2018-08-29 (×4): 10 meq via INTRAVENOUS
  Filled 2018-08-29: qty 50

## 2018-08-29 MED ORDER — ORAL CARE MOUTH RINSE
15.0000 mL | Freq: Two times a day (BID) | OROMUCOSAL | Status: DC
  Administered 2018-08-29 – 2018-09-02 (×7): 15 mL via OROMUCOSAL

## 2018-08-29 MED ORDER — POTASSIUM CHLORIDE 10 MEQ/50ML IV SOLN
10.0000 meq | INTRAVENOUS | Status: AC
  Administered 2018-08-29 (×2): 10 meq via INTRAVENOUS
  Filled 2018-08-29 (×2): qty 50

## 2018-08-29 MED ORDER — AMIODARONE IV BOLUS ONLY 150 MG/100ML
150.0000 mg | Freq: Once | INTRAVENOUS | Status: AC
  Administered 2018-08-29: 150 mg via INTRAVENOUS
  Filled 2018-08-29: qty 100

## 2018-08-29 MED ORDER — METOCLOPRAMIDE HCL 5 MG/ML IJ SOLN
10.0000 mg | Freq: Four times a day (QID) | INTRAMUSCULAR | Status: DC
  Administered 2018-08-29 – 2018-09-01 (×13): 10 mg via INTRAVENOUS
  Filled 2018-08-29 (×14): qty 2

## 2018-08-29 MED FILL — Heparin Sodium (Porcine) Inj 1000 Unit/ML: INTRAMUSCULAR | Qty: 10 | Status: AC

## 2018-08-29 MED FILL — Mannitol IV Soln 20%: INTRAVENOUS | Qty: 500 | Status: AC

## 2018-08-29 MED FILL — Magnesium Sulfate Inj 50%: INTRAMUSCULAR | Qty: 10 | Status: AC

## 2018-08-29 MED FILL — Heparin Sodium (Porcine) Inj 1000 Unit/ML: INTRAMUSCULAR | Qty: 30 | Status: AC

## 2018-08-29 MED FILL — Potassium Chloride Inj 2 mEq/ML: INTRAVENOUS | Qty: 40 | Status: AC

## 2018-08-29 MED FILL — Sodium Bicarbonate IV Soln 8.4%: INTRAVENOUS | Qty: 50 | Status: AC

## 2018-08-29 MED FILL — Sodium Chloride IV Soln 0.9%: INTRAVENOUS | Qty: 2000 | Status: AC

## 2018-08-29 MED FILL — Electrolyte-R (PH 7.4) Solution: INTRAVENOUS | Qty: 4000 | Status: AC

## 2018-08-29 MED FILL — Lidocaine HCl Local Soln Prefilled Syringe 100 MG/5ML (2%): INTRAMUSCULAR | Qty: 5 | Status: AC

## 2018-08-29 NOTE — Progress Notes (Signed)
CRITICAL VALUE ALERT  Critical Value:  Potassium 2.7  Date & Time Notied:  08/29/2018  At 1700  Provider Notified: Rema Fendt RN notified of critical result.   Orders Received/Actions taken: RN Rema Fendt advised she is going to redraw and resend lab to confirm correct result.

## 2018-08-29 NOTE — Progress Notes (Addendum)
Patient went into Afib with rates >140 while attempting to get out of bed  Hendrickson,MD notified  New Order to give 150 mg of amiodrone IV bolus

## 2018-08-29 NOTE — Progress Notes (Signed)
Patient in Afib with rate in 70-110's Asymptomatic at this time Hendrickson,MD Notified  New order for 400 mg PO

## 2018-08-29 NOTE — Discharge Instructions (Signed)

## 2018-08-29 NOTE — Discharge Summary (Signed)
Physician Discharge Summary  Patient ID: Zachary [name] MRN: [id number] DOB/AGE: 05/14/1940 78 y.o.  Admit date: 08/25/2018 Discharge date: 09/02/2018  Admission Diagnoses: Multivessel coronary artery disesae Acute NSTEMI Hypertension  Discharge Diagnoses:  Principal Problem:   S/P CABG x 4 Active Problems:   NSTEMI (non-ST elevated myocardial infarction) ([place])   Essential hypertension   Atrial fibrillation (HCC)  Superventricular tachycardia  Discharged Condition: stable  History of Present Illness:     Zachary [name] is a very pleasant 78 year old male with prior history of hypertension and back problems requiring surgery for which he is fully recovered.  He was a remote tobacco smoker but has not smoked in about 15 years.  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  Symptoms subsided returned and we intensified and 2 days prior to this admission.  He presented to the emergency room here at [facility] on 08/05/2018 complaining of central chest pain radiating to both arms for about 2 days duration.  Work-up in the emergency room included an EKG that showed sinus bradycardia with a right bundle branch block.  Initial troponin was elevated at greater than 1 and peaked at 1.7.  Chest x-ray showed mild bibasilar atelectasis.  He was admitted to the cardiology service. He was taken to the Cath Lab on 08/26/18 where left heart catheterization demonstrated a 70% distal left main stenosis and three-vessel coronary artery disease.  Ejection fraction was estimated at 50% with small area of apical akinesis. He is currently resting comfortably with no chest pain or shortness of breath. 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  He remains active with maintaining rental properties and other various outdoor activities.  He maintains his exercise routine with walking up to an hour a day, squats, and leg lifts.  Hospital Course:   Zachary [name] remained stable following left heart catheterization.  He was taken to the operating room on 08/27/2018 where three-vessel coronary bypass grafting was carried out without complication.  He separated from cardiopulmonary bypass without any difficulty.  He was transferred to the cardiovascular ICU in stable condition, intubated, and hemodynamically stable.  He developed significant postoperative bleeding that persisted during the early hours following surgery.  Decision was made to return to the operating room where mediastinal exploration was carried out.  There were no focal surgical bleeds identified but rather diffuse oozing consistent with generalized coagulopathy.  This was corrected with multiple blood products and the administration of NovoSeven (factor VII).  His bleeding subsided.  He was returned to the cardiovascular ICU where he remained stable.  He was extubated in the early morning hours the first postoperative day.  He had significant volume excess related to initial operation followed by administration of multiple blood products and reexploration of mediastinum.  For this reason, he was diuresed aggressively on the first postoperative day with a Lasix drip.  He remained hemodynamically stable.  He developed atrial fibrillation later in the day on postop day 1.  He was initially treated with oral amiodarone followed by an IV amiodarone bolus early the next morning.  He was eventually placed on amiodarone infusion.  He tended to be bradycardic after converting back to SR so the amiodarone was discontinued.  We also initially avoided b-blockers initially due to bradycardia. On POD5, he had an episode of SVT while ambulating with PT. He was re-evaluated by cardiology and the decision was made to resume low-dose metoprolol.  After this, he remained in stable SR with a rate of 60-80.  Hypokalemia was corrected with IV and oral supplementation.   Consults: cardiology  Significant Diagnostic  Studies:  LEFT HEART CATH AND CORONARY ANGIOGRAPHY  Conclusion    Ost RCA to Prox RCA lesion is 70% stenosed.  Prox RCA lesion is 60% stenosed.  Mid LM to Ost LAD lesion is 70% stenosed.  Ost Cx to Prox Cx lesion is 65% stenosed.  1st Diag lesion is 90% stenosed.  Dist LAD lesion is 90% stenosed.  RV Branch-1 lesion is 80% stenosed.  RV Branch-2 lesion is 95% stenosed.  Prox Cx to Mid Cx lesion is 50% stenosed.  2nd Mrg lesion is 85% stenosed.  Mid RCA to Dist RCA lesion is 99% stenosed.  There is mild left ventricular systolic dysfunction.  LV end diastolic pressure is low.   Coronary Findings  Diagnostic Dominance: Right Left Main  Mid LM to Ost LAD lesion 70% stenosed  Mid LM to Ost LAD lesion is 70% stenosed.  Left Anterior Descending  Dist LAD lesion 90% stenosed  Dist LAD lesion is 90% stenosed.  First Diagonal Branch  1st Diag lesion 90% stenosed  1st Diag lesion is 90% stenosed.  Left Circumflex  Ost Cx to Prox Cx lesion 65% stenosed  Ost Cx to Prox Cx lesion is 65% stenosed.  Prox Cx to Mid Cx lesion 50% stenosed  Prox Cx to Mid Cx lesion is 50% stenosed.  First Obtuse Marginal Branch  Vessel is small in size.  Second Obtuse Marginal Branch  2nd Mrg lesion 85% stenosed  2nd Mrg lesion is 85% stenosed.  Right Coronary Artery  Ost RCA to Prox RCA lesion 70% stenosed  Ost RCA to Prox RCA lesion is 70% stenosed.  Prox RCA lesion 60% stenosed  Prox RCA lesion is 60% stenosed.  Mid RCA to Dist RCA lesion 99% stenosed  Mid RCA to Dist RCA lesion is 99% stenosed.  Right Ventricular Branch  RV Branch-1 lesion 80% stenosed  RV Branch-1 lesion is 80% stenosed.  RV Branch-2 lesion 95% stenosed  RV Branch-2 lesion is 95% stenosed.  Third Right Posterolateral Branch  Collaterals  3rd RPL filled by collaterals from 3rd Mrg.    Intervention  No interventions have been documented. Wall Motion       Resting               Left  Heart  Left Ventricle The left ventricular size is normal. There is mild left ventricular systolic dysfunction. LV end diastolic pressure is low. There is mild LV dysfunction with EF approximately at 50%. There is a small focal area of apical inferior akinesis. LVEDP is 3 mm Hg  Coronary Diagrams  Diagnostic Dominance: Right   Severe multivessel CAD with coronary calcification and 70% distal left main stenosis; 70% ostial LAD stenosis with a proximal bifurcating diagonal vessel with 80% stenosis in the superior branch and 90% stenosis in the diagonal as well as distal focal 90% LAD stenosis; 60% ostial circumflex stenosis with 50% stenosis before moderate size marginal branch with diffuse 85% stenosis; calcified RCA with 70% ostial stenosis followed by 60% proximal and 80% ostial marginal with 95% distal marginal stenosis with thrombus and subtotal Lee occluded mid RCA after the takeoff of this marginal branch. There is TIMI I flow down the distal RCA. There is faint distal collateralization from the left circulation to the distal vessel.  Mild LV dysfunction with global EF at 50% and a  small focal region of inferoapical akinesis. LVEDP 3 mmHg.  RECOMMENDATION: Surgical consultation for CABG revascularization. Heparin will be restarted 8 hours post radial sheath removal. The patient's wife was called and notified of the catheterization results.    Treatments: Surgery  CARDIOTHORACIC SURGERY OPERATIVE NOTE  Date of Procedure:    08/27/2018  Preoperative Diagnosis:        Severe 3-vessel Coronary Artery Disease  Unstable Angina Pectoris  Postoperative Diagnosis:    Same  Procedure:        Coronary Artery Bypass Grafting x 4              Left Internal Mammary Artery to Distal Left Anterior Descending Coronary Artery             Saphenous Vein Graft to Distal Right Coronary Artery             Saphenous Vein Graft to First Obtuse Marginal Branch of Left Circumflex  Coronary Artery             Sapheonous Vein Graft to Diagonal Branch Coronary Artery             Endoscopic Vein Harvest from Right Thigh and Lower Leg  Surgeon:        Valentina Gu. Roxy Manns, MD  Assistant:       B. Murvin Natal, MD  Anesthesia:    Roderic Palau, MD  Operative Findings: ? Normal left ventricular systolic function ? Good quality left internal mammary artery conduit ? Good quality saphenous vein conduit ? Good quality target vessels for grafting    BRIEF CLINICAL NOTE AND INDICATIONS FOR SURGERY   Patient is a 78 year old male with history of hypertension but no previous history of coronary artery disease who presents with symptoms of unstable angina pectoris. Baseline EKG reveals sinus rhythm with right bundle branch block and no obvious significant ST segment elevation. Troponin levels were weakly positive, consistent with non-ST segment elevation myocardial infarction. He has remained pain-free and clinically stable since hospitalization. The patient has been seen in consultation and counseled at length regarding the indications, risks and potential benefits of surgery.  All questions have been answered, and the patient provides full informed consent for the operation as described.  CARDIOTHORACIC SURGERY OPERATIVE NOTE  Date of Procedure:                            08/27/2018  Preoperative Diagnosis:                  Bleeding s/p CABG  Postoperative Diagnosis:                same  Procedure:                                         Mediastinal Reexploration for Bleeding  Surgeon:                                            Valentina Gu. Roxy Manns, MD  Assistant:  Alcide Evener, CRNFA  Anesthesia:                                        Arabella Merles, MD  Operative Findings:   ? Diffuse coagulopathy w/out clear mechanical source of bleeding     BRIEF CLINICAL NOTE AND INDICATIONS FOR SURGERY   Patient is a 78 year old male with history of hypertension who underwent elective coronary artery bypass grafting x4 on August 27, 2018 for severe three-vessel coronary artery disease with unstable angina.  The surgical procedure was uncomplicated.  Shortly after return to the intensive care unit the patient began to have increased bloody chest tube drainage.  This persisted for the first 2 to 3 hours.  Although the patient remained entirely stable from a hemodynamic standpoint, the volume of chest tube output was felt to be large enough and persistent enough to warrant surgical exploration.  The patient's wife was contacted via telephone and the patient brought directly to the operating room on the afternoon of August 27, 2018.   Discharge Exam: Blood pressure 123/68, pulse 61, temperature 98.2 F (36.8 C), temperature source Oral, resp. rate 16, height 5\' 3"  (1.6 m), weight 69.9 kg, SpO2 96 %.  General- alert and comfortable Lungs- clear without rales, wheezes Cor- regular rate and rhythm, no murmur , gallop. Monitor    reviewed, stable SR with no further tachy or brady           arrhythmias. Abdomen- soft, non-tender Extremities - warm, non-tender, no edema Neuro- oriented, appropriate, no focal weakness  Disposition: Discharge disposition: 01-Home or Self Care       Discharge Instructions    Amb Referral to Cardiac Rehabilitation   Complete by: As directed    Diagnosis:  CABG NSTEMI     CABG X ___: 4   After initial evaluation and assessments completed: Virtual Based Care may be provided alone or in conjunction with Phase 2 Cardiac Rehab based on patient barriers.: Yes     Allergies as of 09/02/2018   No Known Allergies     Medication List    STOP taking these medications   amLODipine 10 MG tablet Commonly known as: NORVASC   chlorthalidone 25 MG tablet Commonly known as: HYGROTON     TAKE these medications   acetaminophen 500 MG tablet Commonly known as:  TYLENOL Take 500 mg by mouth every 6 (six) hours as needed for headache (pain).   aspirin EC 81 MG tablet Take 81 mg by mouth every other day.   atorvastatin 80 MG tablet Commonly known as: LIPITOR Take 1 tablet (80 mg total) by mouth daily at 6 PM.   clopidogrel 75 MG tablet Commonly known as: PLAVIX Take 1 tablet (75 mg total) by mouth daily. Start taking on: September 03, 2018   furosemide 40 MG tablet Commonly known as: Lasix Take 1 tablet (40 mg total) by mouth daily. For one week then stop.   hydrocortisone cream 1 % Apply 1 application topically 3 (three) times daily as needed (itchy feet).   losartan 50 MG tablet Commonly known as: COZAAR Take 2 tablets (100 mg total) by mouth daily. What changed: medication strength   metoprolol tartrate 25 MG tablet Commonly known as: LOPRESSOR Take 0.5 tablets (12.5 mg total) by mouth 2 (two) times daily.   multivitamin with minerals Tabs tablet Take 1 tablet by mouth daily.   OVER  THE COUNTER MEDICATION Place 1 drop into both eyes daily as needed (itching/irritation/dry eyes). Over the counter lubricating eye drop   potassium chloride SA 20 MEQ tablet Commonly known as: K-DUR Take 1 tablet (20 mEq total) by mouth daily. For one week then stop. Start taking on: September 03, 2018   traMADol 50 MG tablet Commonly known as: ULTRAM Take 1 tablet (50 mg total) by mouth every 6 (six) hours as needed for moderate pain.      Follow-up Information    [name], MD Follow up on 09/29/2018.   Specialty: Cardiothoracic Surgery Why: You have an appointment with Dr. Roxy Manns on Monday 09/29/18 at 3:00PM.  Please arrive 30 minutes early for a CXR at [facility] located on the first floor of the same building. Contact information: [address] [place] [address] [phone]        Erlene Quan, PA-C Follow up on 09/18/2018.   Specialties: Cardiology, Radiology Why: You have a cardiology follow up appointment  with Mr. Kerin Ransom,  PA-C on Thursday 09/18/18 at 10:30pm.  Contact information: [address] [place] [address] [phone]        [facility] Follow up on 09/12/2018.   Specialty: Cardiothoracic Surgery Why: You have an appointment for suture removal at Dr. Guy Sandifer office on Friday 09/12/18 at [place] information: [place], [place] [phone]         The patient has been discharged on:   1.Beta Blocker:  Yes [ y  ]                              No   [   ]                              If No, reason:  2.Ace Inhibitor/ARB: Yes [  y ]                                     No  [    ]                                     If No, reason:  3.Statin:   Yes [ y  ]                  No  [   ]                  If No, reason:  4.Ecasa:  Yes  [ y  ]                  No   [   ]                  If No, reason:    Signed: Antony Odea, PA-C 09/02/2018, 11:47 AM

## 2018-08-29 NOTE — Progress Notes (Addendum)
CT surgery p.m. Rounds  Patient had a good day, walked in hallway twice Chest tubes out Maintaining sinus rhythm on IV amiodarone A paced for sinus bradycardia Potassium supplemented for low p.m. value Transition to oral amiodarone tomorrow

## 2018-08-29 NOTE — Progress Notes (Addendum)
[facility] DAILY ICU PROGRESS NOTE                   [address]            [place] [address]          [phone]   2 Days Post-Op Procedure(s) (LRB): EXPLORATION POST-OP HEART FOR BLEEDING (N/A)  Total Length of Stay:  LOS: 4 days   Subjective: Says he is more comfortable today. Passing gas.   Developed atrial fibrillation last night, started on oral amiodarone and also given an IV bolus 150mg  this am.   Objective: Vital signs in last 24 hours: Temp:  [97.6 F (36.4 C)-97.9 F (36.6 C)] 97.6 F (36.4 C) (06/26 0745) Pulse Rate:  [59-110] 90 (06/26 0700) Cardiac Rhythm: Atrial fibrillation (06/26 0756) Resp:  [0-27] 24 (06/26 0700) BP: (98-157)/(65-92) 107/71 (06/26 0700) SpO2:  [93 %-100 %] 93 % (06/26 0700) Weight:  [64.8 kg] 64.8 kg (06/26 0600)  Filed Weights   08/27/18 0641 08/28/18 0530 08/29/18 0600  Weight: 64 kg 75.3 kg 64.8 kg    Weight change: -10.5 kg    Intake/Output from previous day: 06/25 0701 - 06/26 0700 In: 1345.6 [P.O.:350; I.V.:617.3; IV Piggyback:378.3] Out: 4595 [Urine:4035; Chest Tube:560]  Intake/Output this shift: No intake/output data recorded.  Current Meds: Scheduled Meds:  acetaminophen  1,000 mg Oral Q6H   aspirin EC  81 mg Oral Daily   atorvastatin  80 mg Oral q1800   bisacodyl  10 mg Oral Daily   Or   bisacodyl  10 mg Rectal Daily   Chlorhexidine Gluconate Cloth  6 each Topical Daily   clopidogrel  75 mg Oral Daily   docusate sodium  200 mg Oral Daily   insulin aspart  0-24 Units Subcutaneous Q4H   insulin aspart  0-24 Units Subcutaneous Q4H   mouth rinse  15 mL Mouth Rinse BID   metoprolol tartrate  12.5 mg Oral BID   moving right along book   Does not apply Once   pantoprazole  40 mg Oral Daily   sodium chloride flush  3 mL Intravenous Q12H   Continuous Infusions:  sodium chloride     amiodarone     Followed by   amiodarone     cefUROXime (ZINACEF)  IV Stopped (08/29/18 0015)    furosemide (LASIX) infusion 8 mg/hr (08/29/18 0700)   lactated ringers     magnesium sulfate bolus IVPB     magnesium sulfate     potassium chloride 10 mEq (08/29/18 0720)   potassium chloride     PRN Meds:.metoprolol tartrate, morphine injection, ondansetron (ZOFRAN) IV, oxyCODONE, promethazine, sodium chloride flush, traMADol  General appearance: alert, cooperative and no distress Neurologic: intact Heart: atrial fibrillation with VR 100-110. Pacer off.  Lungs: Breath sounds clear anterior, CT drainage minimal.  Abdomen: few bowel sounds today, softer, non-tender.  Extremities: edema improving, all are well perfused. Wound: Sternal and RLE EVH incisions are covered with dry surgical dressings.  Lab Results: CBC: Recent Labs    08/28/18 1930 08/29/18 0445  WBC 15.8* 16.4*  HGB 11.7* 11.5*  HCT 33.4* 33.2*  PLT 141* 142*   BMET:  Recent Labs    08/29/18 0038 08/29/18 0445  NA 141 143  K 3.3* 3.4*  CL 106 107  CO2 25 27  GLUCOSE 135* 119*  BUN 18 19  CREATININE 1.38* 1.38*  CALCIUM 7.8* 7.9*    CMET:   PT/INR:  Recent Labs  08/27/18 1236  LABPROT 19.4*  INR 1.7*   Radiology: Dg Chest Port 1 View  Result Date: 08/29/2018 CLINICAL DATA:  78 year old male postoperative day 2 status post CABG. EXAM: PORTABLE CHEST 1 VIEW COMPARISON:  08/28/2018 and earlier. FINDINGS: Portable AP semi upright view at 0514 hours. Swan-Ganz catheter removed, right IJ introducer sheath remains. Stable mediastinal and left chest tubes. Continued low lung volumes with opacity at both lung bases most resembling atelectasis. Similar platelike atelectasis in the left upper lobe. No pneumothorax. Small right pleural effusion suspected. Stable cardiac size and mediastinal contours. Sequelae of CABG. Improved visible bowel gas pattern. Stable visualized osseous structures. IMPRESSION: 1. Swan-Ganz catheter removed, right IJ introducer sheath remains. Stable chest and mediastinal tubes. 2.  No pneumothorax. Low lung volumes atelectasis and probable small right pleural effusion. Electronically Signed   By: Genevie Ann M.D.   On: 08/29/2018 08:10     Assessment/Plan: S/P Procedure(s) (LRB): EXPLORATION POST-OP HEART FOR BLEEDING (N/A)  -POD2 CABG x 4 for MVCAD, acute NSTEMI, EF 50%. Re-op for post-op bleeding related to coagulopathy. No surgical bleed identified. Coagulopathy corrected with blood products and Factor VII.  He has been hemodynamically stable. D/C chest tubes today, advance activity.    -Post-op atrial fibrillation-Amiodarone load initiated, start amio infusion this am. Correcting hypokalemia and will also supplement Mg++  -Respiratory insufficiency secondary to significant volume overload. Net 3+L diuresis past 24 hours.  CXR shows bibasilar ATX / effusion R>L.  Continue working on diuresis and pulmonary hygiene.   -Expected acute blood loss anemia- appropriate response to transfused PRBC's, Minimal ongoing losses. Monitor.  -DVT PPX--SCD's, add Lovenox today   Antony Odea, PA-C [phone] 08/29/2018 8:19 AM    I have seen and examined the patient and agree with the assessment and plan as outlined.  Reports feeling better, but now in Afib w/ RVR.  Will bolus amiodarone and start drip.  D/C tubes, foley and lines.  Mobilize.  Possible transfer 4E later today or tomorrow depending on rhythm.  [name], MD 08/29/2018 8:53 AM

## 2018-08-30 ENCOUNTER — Inpatient Hospital Stay (HOSPITAL_COMMUNITY): Payer: PPO

## 2018-08-30 LAB — TYPE AND SCREEN
ABO/RH(D): A POS
Antibody Screen: NEGATIVE
Unit division: 0
Unit division: 0
Unit division: 0
Unit division: 0
Unit division: 0
Unit division: 0

## 2018-08-30 LAB — BPAM RBC
Blood Product Expiration Date: 202007122359
Blood Product Expiration Date: 202007122359
Blood Product Expiration Date: 202007132359
Blood Product Expiration Date: 202007132359
Blood Product Expiration Date: 202007142359
Blood Product Expiration Date: 202007142359
ISSUE DATE / TIME: 202006241547
ISSUE DATE / TIME: 202006241547
ISSUE DATE / TIME: 202006241658
ISSUE DATE / TIME: 202006241658
Unit Type and Rh: 6200
Unit Type and Rh: 6200
Unit Type and Rh: 6200
Unit Type and Rh: 6200
Unit Type and Rh: 6200
Unit Type and Rh: 6200

## 2018-08-30 LAB — CBC
HCT: 33.9 % — ABNORMAL LOW (ref 39.0–52.0)
Hemoglobin: 11.5 g/dL — ABNORMAL LOW (ref 13.0–17.0)
MCH: 31 pg (ref 26.0–34.0)
MCHC: 33.9 g/dL (ref 30.0–36.0)
MCV: 91.4 fL (ref 80.0–100.0)
Platelets: 150 10*3/uL (ref 150–400)
RBC: 3.71 MIL/uL — ABNORMAL LOW (ref 4.22–5.81)
RDW: 13.9 % (ref 11.5–15.5)
WBC: 14.9 10*3/uL — ABNORMAL HIGH (ref 4.0–10.5)
nRBC: 0 % (ref 0.0–0.2)

## 2018-08-30 LAB — BASIC METABOLIC PANEL
Anion gap: 10 (ref 5–15)
Anion gap: 11 (ref 5–15)
BUN: 23 mg/dL (ref 8–23)
BUN: 27 mg/dL — ABNORMAL HIGH (ref 8–23)
CO2: 30 mmol/L (ref 22–32)
CO2: 30 mmol/L (ref 22–32)
Calcium: 7.8 mg/dL — ABNORMAL LOW (ref 8.9–10.3)
Calcium: 8.1 mg/dL — ABNORMAL LOW (ref 8.9–10.3)
Chloride: 102 mmol/L (ref 98–111)
Chloride: 100 mmol/L (ref 98–111)
Creatinine, Ser: 1.35 mg/dL — ABNORMAL HIGH (ref 0.61–1.24)
Creatinine, Ser: 1.31 mg/dL — ABNORMAL HIGH (ref 0.61–1.24)
GFR calc Af Amer: 58 mL/min — ABNORMAL LOW (ref >60)
GFR calc Af Amer: >60 mL/min (ref >60)
GFR calc non Af Amer: 50 mL/min — ABNORMAL LOW (ref >60)
GFR calc non Af Amer: 52 mL/min — ABNORMAL LOW (ref >60)
Glucose, Bld: 123 mg/dL — ABNORMAL HIGH (ref 70–99)
Glucose, Bld: 134 mg/dL — ABNORMAL HIGH (ref 70–99)
Potassium: 2.8 mmol/L — ABNORMAL LOW (ref 3.5–5.1)
Potassium: 3.1 mmol/L — ABNORMAL LOW (ref 3.5–5.1)
Sodium: 142 mmol/L (ref 135–145)
Sodium: 141 mmol/L (ref 135–145)

## 2018-08-30 LAB — GLUCOSE, CAPILLARY
Glucose-Capillary: 105 mg/dL — ABNORMAL HIGH (ref 70–99)
Glucose-Capillary: 113 mg/dL — ABNORMAL HIGH (ref 70–99)
Glucose-Capillary: 117 mg/dL — ABNORMAL HIGH (ref 70–99)
Glucose-Capillary: 119 mg/dL — ABNORMAL HIGH (ref 70–99)
Glucose-Capillary: 128 mg/dL — ABNORMAL HIGH (ref 70–99)
Glucose-Capillary: 142 mg/dL — ABNORMAL HIGH (ref 70–99)

## 2018-08-30 LAB — MAGNESIUM: Magnesium: 2.5 mg/dL — ABNORMAL HIGH (ref 1.7–2.4)

## 2018-08-30 MED ORDER — POTASSIUM CHLORIDE 10 MEQ/50ML IV SOLN: 10.0000 meq | INTRAVENOUS | Status: DC

## 2018-08-30 MED ORDER — POTASSIUM CHLORIDE 10 MEQ/50ML IV SOLN
10.0000 meq | INTRAVENOUS | Status: AC
  Administered 2018-08-30 (×3): 10 meq via INTRAVENOUS
  Filled 2018-08-30 (×3): qty 50

## 2018-08-30 MED ORDER — POTASSIUM CHLORIDE CRYS ER 20 MEQ PO TBCR
20.0000 meq | EXTENDED_RELEASE_TABLET | ORAL | Status: AC
  Administered 2018-08-30 (×3): 20 meq via ORAL
  Filled 2018-08-30 (×3): qty 1

## 2018-08-30 NOTE — Progress Notes (Addendum)
[facility] DAILY ICU PROGRESS NOTE                   [address]            [place], [address]          [phone]   3 Days Post-Op Procedure(s) (LRB): EXPLORATION POST-OP HEART FOR BLEEDING (N/A)  Total Length of Stay:  LOS: 5 days   Subjective: Awake and alert, making progress with ambulation. Pain controlled.   Converted from afib to sinus bradycardia at 50/min on amiodarone drip. Currently paced AAI at 70/min  Objective: Vital signs in last 24 hours: Temp:  [97.6 F (36.4 C)-98.3 F (36.8 C)] 98.1 F (36.7 C) (06/27 1100) Pulse Rate:  [58-77] 72 (06/27 1200) Cardiac Rhythm: Atrial paced (06/27 1200) Resp:  [10-22] 18 (06/27 1200) BP: (107-142)/(66-94) 120/94 (06/27 1200) SpO2:  [95 %-100 %] 98 % (06/27 1200) Weight:  [71.3 kg] 71.3 kg (06/27 0500)  Filed Weights   08/28/18 0530 08/29/18 0600 08/30/18 0500  Weight: 75.3 kg 64.8 kg 71.3 kg    Weight change: 6.5 kg     Intake/Output from previous day: 06/26 0701 - 06/27 0700 In: 1503.5 [P.O.:560; I.V.:514.5; IV Piggyback:429] Out: 3710 [Urine:1625; Chest Tube:20]  Intake/Output this shift: Total I/O In: 169.3 [I.V.:99.9; IV Piggyback:69.4] Out: -   Current Meds: Scheduled Meds: . acetaminophen  1,000 mg Oral Q6H  . aspirin EC  81 mg Oral Daily  . atorvastatin  80 mg Oral q1800  . bisacodyl  10 mg Oral Daily   Or  . bisacodyl  10 mg Rectal Daily  . Chlorhexidine Gluconate Cloth  6 each Topical Daily  . clopidogrel  75 mg Oral Daily  . docusate sodium  200 mg Oral Daily  . insulin aspart  0-24 Units Subcutaneous Q4H  . insulin aspart  0-24 Units Subcutaneous Q4H  . mouth rinse  15 mL Mouth Rinse BID  . metoCLOPramide (REGLAN) injection  10 mg Intravenous Q6H  . moving right along book   Does not apply Once  . pantoprazole  40 mg Oral Daily  . sodium chloride flush  3 mL Intravenous Q12H   Continuous Infusions: . sodium chloride Stopped (08/29/18 1349)  . amiodarone 30 mg/hr (08/30/18  1200)  . lactated ringers 10 mL/hr at 08/30/18 0733  . magnesium sulfate     PRN Meds:.metoprolol tartrate, morphine injection, ondansetron (ZOFRAN) IV, oxyCODONE, promethazine, sodium chloride flush, traMADol  General appearance:alert, cooperative and no distress Neurologic:intact Heart:S. Brady, paced AAI at 72 with good capture. Lungs:Breath sounds clear anterior Abdomen:soft, non-tender.  Extremities:edema improving, all are well perfused. Wound:Sternal and RLE EVH incisions are covered with dry surgical dressings.  Lab Results: CBC: Recent Labs    08/29/18 0445 08/30/18 0337  WBC 16.4* 14.9*  HGB 11.5* 11.5*  HCT 33.2* 33.9*  PLT 142* 150   BMET:  Recent Labs    08/29/18 2000 08/30/18 0337  NA 138 142  K 2.5* 2.8*  CL 99 102  CO2 27 30  GLUCOSE 138* 123*  BUN 21 23  CREATININE 1.62* 1.35*  CALCIUM 7.7* 7.8*    CMET:     PT/INR: No results for input(s): LABPROT, INR in the last 72 hours. Radiology: Dg Chest 2 View  Result Date: 08/30/2018 CLINICAL DATA:  Atelectasis EXAM: CHEST - 2 VIEW COMPARISON:  Yesterday FINDINGS: Small right pleural effusion. Low lung volumes with atelectasis. Thoracic drains have been removed without visible pneumothorax. Stable postoperative heart  size. Right IJ sheath in stable position. IMPRESSION: No visible pneumothorax after chest tube removal. Continued low volumes with atelectasis and right pleural effusion. Electronically Signed   By: Monte Fantasia M.D.   On: 08/30/2018 07:18     Assessment/Plan: S/P Procedure(s) (LRB): EXPLORATION POST-OP HEART FOR BLEEDING (N/A)  -POD3 CABG x 4 for MVCAD, acute NSTEMI, EF 50%. Re-op for post-op bleeding related to coagulopathy. No surgical bleed identified. Coagulopathy corrected with blood products and Factor VII.  He has been hemodynamically stable. Advancing diet, doing well with mobility.   -Post-op atrial fibrillation-Amiodarone load initiated, start amio infusion yesterday  am. He converted to S. Bradycardia  At 50/min.  Currently paced AAI with appropriate capture. Will d/c amio drip and observe.    -Hypokalemia- K+ 2.8. this am. 3 runs of 79mEq KCL given per protocol. Will repeat lab now and order additional runs as required.  Recheck Mg++ level.   -Respiratory insufficiency secondary to significant volume overload.Good response to diuresis past 48 hours.  CXR shows bibasilar ATX / effusion R>L.  Continue working on diuresis and pulmonary hygiene.   -Expected acute blood loss anemia- appropriate response to transfused PRBC's, Minimal ongoing losses. Stable Hct Monitor.  -DVT PPX--Continue Lovenox today     Antony Odea, PA-C [phone] 08/30/2018 2:17 PM  Maintaining nsr but wit sig brady- hold amiodarone and pace at 70 patient examined and medical record reviewed,agree with above note. Tharon Aquas Trigt III 08/30/2018

## 2018-08-31 LAB — CBC
HCT: 34.2 % — ABNORMAL LOW (ref 39.0–52.0)
Hemoglobin: 11.5 g/dL — ABNORMAL LOW (ref 13.0–17.0)
MCH: 30.9 pg (ref 26.0–34.0)
MCHC: 33.6 g/dL (ref 30.0–36.0)
MCV: 91.9 fL (ref 80.0–100.0)
Platelets: 162 10*3/uL (ref 150–400)
RBC: 3.72 MIL/uL — ABNORMAL LOW (ref 4.22–5.81)
RDW: 13.8 % (ref 11.5–15.5)
WBC: 12.3 10*3/uL — ABNORMAL HIGH (ref 4.0–10.5)
nRBC: 0.2 % (ref 0.0–0.2)

## 2018-08-31 LAB — BASIC METABOLIC PANEL
Anion gap: 6 (ref 5–15)
BUN: 30 mg/dL — ABNORMAL HIGH (ref 8–23)
CO2: 30 mmol/L (ref 22–32)
Calcium: 8.1 mg/dL — ABNORMAL LOW (ref 8.9–10.3)
Chloride: 103 mmol/L (ref 98–111)
Creatinine, Ser: 1.27 mg/dL — ABNORMAL HIGH (ref 0.61–1.24)
GFR calc Af Amer: >60 mL/min (ref >60)
GFR calc non Af Amer: 54 mL/min — ABNORMAL LOW (ref >60)
Glucose, Bld: 114 mg/dL — ABNORMAL HIGH (ref 70–99)
Potassium: 4.4 mmol/L (ref 3.5–5.1)
Sodium: 139 mmol/L (ref 135–145)

## 2018-08-31 LAB — GLUCOSE, CAPILLARY
Glucose-Capillary: 106 mg/dL — ABNORMAL HIGH (ref 70–99)
Glucose-Capillary: 112 mg/dL — ABNORMAL HIGH (ref 70–99)
Glucose-Capillary: 138 mg/dL — ABNORMAL HIGH (ref 70–99)
Glucose-Capillary: 86 mg/dL (ref 70–99)
Glucose-Capillary: 97 mg/dL (ref 70–99)
Glucose-Capillary: 99 mg/dL (ref 70–99)

## 2018-08-31 MED ORDER — ENOXAPARIN SODIUM 30 MG/0.3ML ~~LOC~~ SOLN
30.0000 mg | SUBCUTANEOUS | Status: DC
  Administered 2018-08-31 – 2018-09-02 (×3): 30 mg via SUBCUTANEOUS
  Filled 2018-08-31 (×3): qty 0.3

## 2018-08-31 MED ORDER — INSULIN ASPART 100 UNIT/ML ~~LOC~~ SOLN
0.0000 [IU] | Freq: Three times a day (TID) | SUBCUTANEOUS | Status: DC
  Administered 2018-09-01: 2 [IU] via SUBCUTANEOUS

## 2018-08-31 MED ORDER — INSULIN ASPART 100 UNIT/ML ~~LOC~~ SOLN: 0.0000 [IU] | Freq: Three times a day (TID) | SUBCUTANEOUS | Status: DC

## 2018-08-31 NOTE — Progress Notes (Signed)
4 Days Post-Op Procedure(s) (LRB): EXPLORATION POST-OP HEART FOR BLEEDING (N/A) Subjective: On 4 L O2, nsr Walking Ready to tx 4E  Objective: Vital signs in last 24 hours: Temp:  [97.8 F (36.6 C)-98.1 F (36.7 C)] 98 F (36.7 C) (06/28 0750) Pulse Rate:  [65-74] 69 (06/28 0700) Cardiac Rhythm: Atrial paced (06/28 0400) Resp:  [7-21] 16 (06/28 0700) BP: (110-142)/(61-117) 114/69 (06/28 0700) SpO2:  [95 %-100 %] 96 % (06/28 0700) Weight:  [70.5 kg] 70.5 kg (06/28 0500)  Hemodynamic parameters for last 24 hours:    Intake/Output from previous day: 06/27 0701 - 06/28 0700 In: 552.6 [P.O.:350; I.V.:133.2; IV Piggyback:69.4] Out: 350 [Urine:350] Intake/Output this shift: No intake/output data recorded.       Exam    General- alert and comfortable    Neck- no JVD, no cervical adenopathy palpable, no carotid bruit   Lungs- clear without rales, wheezes   Cor- regular rate and rhythm, no murmur , gallop   Abdomen- soft, non-tender   Extremities - warm, non-tender, minimal edema   Neuro- oriented, appropriate, no focal weakness   Lab Results: Recent Labs    08/30/18 0337 08/31/18 0312  WBC 14.9* 12.3*  HGB 11.5* 11.5*  HCT 33.9* 34.2*  PLT 150 162   BMET:  Recent Labs    08/30/18 1420 08/31/18 0312  NA 141 139  K 3.1* 4.4  CL 100 103  CO2 30 30  GLUCOSE 134* 114*  BUN 27* 30*  CREATININE 1.31* 1.27*  CALCIUM 8.1* 8.1*    PT/INR: No results for input(s): LABPROT, INR in the last 72 hours. ABG    Component Value Date/Time   PHART 7.330 (L) 08/28/2018 0605   HCO3 17.3 (L) 08/28/2018 0605   TCO2 18 (L) 08/28/2018 0605   ACIDBASEDEF 8.0 (H) 08/28/2018 0605   O2SAT 91.0 08/28/2018 0605   CBG (last 3)  Recent Labs    08/30/18 2338 08/31/18 0406 08/31/18 0747  GLUCAP 99 86 138*    Assessment/Plan: S/P Procedure(s) (LRB): EXPLORATION POST-OP HEART FOR BLEEDING (N/A) Mobilize Diuresis Plan for transfer to step-down: see transfer orders   LOS: 6  days    Tharon Aquas Trigt III 08/31/2018

## 2018-08-31 NOTE — Progress Notes (Addendum)
Patient arrived to 4E room 01 at this time. Telemetry applied and CCMD notified. CHG bath done. V/s and assessment complete. Patient oriented to room and how to use call bell to call nurse. Pacing monitor attached to patient and set at 50 per Idelia Salm, RN and verification.  Will continue to monitor.   Emelda Fear, RN

## 2018-09-01 ENCOUNTER — Inpatient Hospital Stay (HOSPITAL_COMMUNITY): Payer: PPO

## 2018-09-01 ENCOUNTER — Encounter (HOSPITAL_COMMUNITY): Payer: Self-pay | Admitting: *Deleted

## 2018-09-01 DIAGNOSIS — I471 Supraventricular tachycardia: Secondary | ICD-10-CM

## 2018-09-01 LAB — CBC
HCT: 33.6 % — ABNORMAL LOW (ref 39.0–52.0)
Hemoglobin: 11 g/dL — ABNORMAL LOW (ref 13.0–17.0)
MCH: 30.6 pg (ref 26.0–34.0)
MCHC: 32.7 g/dL (ref 30.0–36.0)
MCV: 93.6 fL (ref 80.0–100.0)
Platelets: 178 10*3/uL (ref 150–400)
RBC: 3.59 MIL/uL — ABNORMAL LOW (ref 4.22–5.81)
RDW: 13.7 % (ref 11.5–15.5)
WBC: 9.9 10*3/uL (ref 4.0–10.5)
nRBC: 0 % (ref 0.0–0.2)

## 2018-09-01 LAB — BASIC METABOLIC PANEL
Anion gap: 7 (ref 5–15)
BUN: 32 mg/dL — ABNORMAL HIGH (ref 8–23)
CO2: 29 mmol/L (ref 22–32)
Calcium: 8.1 mg/dL — ABNORMAL LOW (ref 8.9–10.3)
Chloride: 105 mmol/L (ref 98–111)
Creatinine, Ser: 1.05 mg/dL (ref 0.61–1.24)
GFR calc Af Amer: >60 mL/min (ref >60)
GFR calc non Af Amer: >60 mL/min (ref >60)
Glucose, Bld: 103 mg/dL — ABNORMAL HIGH (ref 70–99)
Potassium: 3.3 mmol/L — ABNORMAL LOW (ref 3.5–5.1)
Sodium: 141 mmol/L (ref 135–145)

## 2018-09-01 LAB — GLUCOSE, CAPILLARY
Glucose-Capillary: 99 mg/dL (ref 70–99)
Glucose-Capillary: 134 mg/dL — ABNORMAL HIGH (ref 70–99)
Glucose-Capillary: 84 mg/dL (ref 70–99)

## 2018-09-01 MED ORDER — METOPROLOL TARTRATE 12.5 MG HALF TABLET: 12.5000 mg | ORAL_TABLET | Freq: Two times a day (BID) | ORAL | Status: DC

## 2018-09-01 MED ORDER — LOSARTAN POTASSIUM 50 MG PO TABS
50.0000 mg | ORAL_TABLET | Freq: Every day | ORAL | Status: DC
  Administered 2018-09-01 – 2018-09-02 (×2): 50 mg via ORAL
  Filled 2018-09-01 (×2): qty 1

## 2018-09-01 MED ORDER — FUROSEMIDE 10 MG/ML IJ SOLN
40.0000 mg | Freq: Once | INTRAMUSCULAR | Status: AC
  Administered 2018-09-01: 40 mg via INTRAVENOUS
  Filled 2018-09-01: qty 4

## 2018-09-01 MED ORDER — POTASSIUM CHLORIDE CRYS ER 20 MEQ PO TBCR
30.0000 meq | EXTENDED_RELEASE_TABLET | Freq: Every day | ORAL | Status: DC
  Administered 2018-09-01 – 2018-09-02 (×2): 30 meq via ORAL
  Filled 2018-09-01 (×2): qty 1

## 2018-09-01 MED ORDER — POTASSIUM CHLORIDE CRYS ER 20 MEQ PO TBCR
40.0000 meq | EXTENDED_RELEASE_TABLET | Freq: Once | ORAL | Status: AC
  Administered 2018-09-01: 40 meq via ORAL
  Filled 2018-09-01: qty 2

## 2018-09-01 MED ORDER — METOPROLOL TARTRATE 12.5 MG HALF TABLET
12.5000 mg | ORAL_TABLET | Freq: Two times a day (BID) | ORAL | Status: DC
  Administered 2018-09-01 – 2018-09-02 (×3): 12.5 mg via ORAL
  Filled 2018-09-01 (×3): qty 1

## 2018-09-01 NOTE — Progress Notes (Signed)
Wife called and updated on patient. All questions answered will monitor patient. Corvette Orser, Bettina Gavia RN

## 2018-09-01 NOTE — Progress Notes (Signed)
Progress Note  Patient Name: Zachary [name] Date of Encounter: 09/01/2018  Primary Cardiologist: No primary care provider on file.   Subjective   Feeling ok. No chest pain or dyspnea. Ambulating without symptoms, but noted to have frequent arrhythmia with ambulation.   Inpatient Medications    Scheduled Meds: . acetaminophen  1,000 mg Oral Q6H  . aspirin EC  81 mg Oral Daily  . atorvastatin  80 mg Oral q1800  . bisacodyl  10 mg Oral Daily   Or  . bisacodyl  10 mg Rectal Daily  . Chlorhexidine Gluconate Cloth  6 each Topical Daily  . clopidogrel  75 mg Oral Daily  . docusate sodium  200 mg Oral Daily  . enoxaparin (LOVENOX) injection  30 mg Subcutaneous Q24H  . insulin aspart  0-24 Units Subcutaneous TID WC  . losartan  50 mg Oral Daily  . mouth rinse  15 mL Mouth Rinse BID  . metoCLOPramide (REGLAN) injection  10 mg Intravenous Q6H  . moving right along book   Does not apply Once  . pantoprazole  40 mg Oral Daily  . potassium chloride  30 mEq Oral Daily  . sodium chloride flush  3 mL Intravenous Q12H   Continuous Infusions: . sodium chloride Stopped (08/29/18 1349)  . lactated ringers 10 mL/hr at 08/30/18 0733  . magnesium sulfate     PRN Meds: metoprolol tartrate, morphine injection, ondansetron (ZOFRAN) IV, oxyCODONE, promethazine, sodium chloride flush, traMADol   Vital Signs    Vitals:   09/01/18 1145 09/01/18 1200 09/01/18 1215 09/01/18 1230  BP: (!) 148/73 129/72 121/81 138/76  Pulse: 72 74 74 73  Resp: 16 13 20 15   Temp:      TempSrc:      SpO2: 95% 91%  90%  Weight:      Height:        Intake/Output Summary (Last 24 hours) at 09/01/2018 1312 Last data filed at 09/01/2018 1200 Gross per 24 hour  Intake 240 ml  Output 1200 ml  Net -960 ml   Last 3 Weights 09/01/2018 08/31/2018 08/30/2018  Weight (lbs) 157 lb 6.5 oz 155 lb 6.8 oz 157 lb 3 oz  Weight (kg) 71.4 kg 70.5 kg 71.3 kg      Telemetry    Sinus rhythm with frequent supraventricular runs-  Personally Reviewed  Physical Exam  Alert, oriented male in NAD GEN: No acute distress.   Neck: No JVD Cardiac: RRR, no murmurs, rubs, or gallops.  Respiratory: Clear to auscultation bilaterally. GI: Soft, nontender, non-distended  MS: 1+ bilateral pretibial edema; No deformity. Neuro:  Nonfocal  Psych: Normal affect   Labs    High Sensitivity Troponin:  No results for input(s): TROPONINIHS in the last 720 hours.    Cardiac Enzymes Recent Labs  Lab 08/25/18 1839 08/25/18 2258 08/26/18 0600  TROPONINI 1.01* 1.03* 1.71*   No results for input(s): TROPIPOC in the last 168 hours.   Chemistry Recent Labs  Lab 08/27/18 0522  08/30/18 1420 08/31/18 0312 09/01/18 0351  NA 139   < > 141 139 141  K 3.5   < > 3.1* 4.4 3.3*  CL 103   < > 100 103 105  CO2 25   < > 30 30 29   GLUCOSE 109*   < > 134* 114* 103*  BUN 20   < > 27* 30* 32*  CREATININE 1.39*   < > 1.31* 1.27* 1.05  CALCIUM 9.2   < > 8.1* 8.1*  8.1*  PROT 7.1  --   --   --   --   ALBUMIN 3.9  --   --   --   --   AST 43*  --   --   --   --   ALT 46*  --   --   --   --   ALKPHOS 67  --   --   --   --   BILITOT 0.8  --   --   --   --   GFRNONAA 48*   < > 52* 54* >60  GFRAA 56*   < > >60 >60 >60  ANIONGAP 11   < > 11 6 7    < > = values in this interval not displayed.     Hematology Recent Labs  Lab 08/30/18 0337 08/31/18 0312 09/01/18 0351  WBC 14.9* 12.3* 9.9  RBC 3.71* 3.72* 3.59*  HGB 11.5* 11.5* 11.0*  HCT 33.9* 34.2* 33.6*  MCV 91.4 91.9 93.6  MCH 31.0 30.9 30.6  MCHC 33.9 33.6 32.7  RDW 13.9 13.8 13.7  PLT 150 162 178    BNPNo results for input(s): BNP, PROBNP in the last 168 hours.   DDimer No results for input(s): DDIMER in the last 168 hours.   Radiology    Dg Chest 2 View  Result Date: 09/01/2018 CLINICAL DATA:  Status post coronary bypass graft. EXAM: CHEST - 2 VIEW COMPARISON:  Radiographs of August 30, 2018. FINDINGS: Stable cardiomediastinal silhouette. Status post coronary artery bypass  graft. Atherosclerosis of thoracic aorta is noted. No pneumothorax is noted. Right internal jugular venous sheath has been removed. Small right pleural effusion is noted with associated atelectasis. Minimal left basilar subsegmental atelectasis is noted. Bony thorax is unremarkable. IMPRESSION: No pneumothorax is noted. Mild right pleural effusion is noted with associated right basilar subsegmental atelectasis. Aortic Atherosclerosis (ICD10-I70.0). Electronically Signed   By: Marijo Conception M.D.   On: 09/01/2018 07:28    Cardiac Studies   2D Echo 08/26/2018: IMPRESSIONS    1. Mild hypokinesis of the left ventricular, basal inferolateral wall.  2. The left ventricle has normal systolic function, with an ejection fraction of 55-60%. The cavity size was normal. There is moderately increased left ventricular wall thickness. Left ventricular diastolic parameters were normal.  3. The right ventricle has normal systolic function. The cavity was normal. There is no increase in right ventricular wall thickness. Right ventricular systolic pressure could not be assessed.  4. The pericardium was not assessed.  5. No evidence of mitral valve stenosis.  6. The aortic valve is tricuspid. No stenosis of the aortic valve.  7. The aortic root and ascending aorta are normal in size and structure.  8. The interatrial septum was not well visualized.  SUMMARY   Normal LV EF, normal strain. Visually appears to have basal inferolateral hypokinesis, but this is mild. No significant valve disease.  Patient Profile     78 y.o. 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 NSTEMI: on appropriate medical therapy post-CABG. Will add beta-blocker today (see below). 2. Post-op volume overload: treated with IV ---> PO lasix. Per surgical team. 3. PSVT: frequent runs of nonsustained supraventricular beats. QRS morphology unchanged from sinus beats with RBBB pattern.  Will check 12 lead EKG. I think he should be OK for a low dose beta blocker as rhythm now sinus with normal AV conduction. Add metoprolol 12.5  mg BID. Will arrange post-op cardiology follow-up.  For questions or updates, please contact [place] Please consult [email or web address] for contact info under     Signed, Sherren Mocha, MD  09/01/2018, 1:12 PM

## 2018-09-01 NOTE — Progress Notes (Addendum)
      [address]       [place] [address]             [phone]        5 Days Post-Op Procedure(s) (LRB): EXPLORATION POST-OP HEART FOR BLEEDING (N/A)  Subjective: Patient states breakfast did not arrive so he is trying to order it. He had 2 bowel movements yesterday.  Objective: Vital signs in last 24 hours: Temp:  [97.5 F (36.4 C)-99.1 F (37.3 C)] 99.1 F (37.3 C) (06/29 0423) Pulse Rate:  [62-70] 63 (06/29 0515) Cardiac Rhythm: Normal sinus rhythm;Bundle branch block (06/29 0459) Resp:  [9-20] 18 (06/29 0515) BP: (125-152)/(67-80) 152/80 (06/29 0423) SpO2:  [92 %-99 %] 92 % (06/29 0515) FiO2 (%):  [50 %] 50 % (06/28 1456) Weight:  [71.4 kg] 71.4 kg (06/29 0450)  Pre op weight 64 kg Current Weight  09/01/18 71.4 kg      Intake/Output from previous day: No intake/output data recorded.   Physical Exam:  Cardiovascular: RRR Pulmonary: Diminished bibasilar breath sounds R>L Abdomen: Soft, non tender, bowel sounds present. Extremities: Bilateral lower extremity edema. Wounds: Clean and dry.  No erythema or signs of infection.  Lab Results: CBC: Recent Labs    08/31/18 0312 09/01/18 0351  WBC 12.3* 9.9  HGB 11.5* 11.0*  HCT 34.2* 33.6*  PLT 162 178   BMET:  Recent Labs    08/31/18 0312 09/01/18 0351  NA 139 141  K 4.4 3.3*  CL 103 105  CO2 30 29  GLUCOSE 114* 103*  BUN 30* 32*  CREATININE 1.27* 1.05  CALCIUM 8.1* 8.1*    PT/INR:  Lab Results  Component Value Date   INR 1.7 (H) 08/27/2018   INR 1.0 04/27/2008   ABG:  INR: Will add last result for INR, ABG once components are confirmed Will add last 4 CBG results once components are confirmed  Assessment/Plan:  1. CV - S/p NSTEMI. First degree heart block, SR in the 60's this am. He is hypertensive this am so will restart low dose Losartan. On Plavix 75 mg daily. 2.  Pulmonary - On 3 liter2 via . CXR this am appears to show right pleural effusion and  atelectasis. Encourage incentive spirometer. 3. Volume Overload - Will give Lasix 40 mg daily 4.  Acute blood loss anemia - H and H stable at 11 and 33.6 this am. 5. Supplement potassium 6. Remove EPW later this am  Donielle M ZimmermanPA-C 09/01/2018,7:10 AM [phone]  I have seen and examined the patient and agree with the assessment and plan as outlined.  Looks good and wants to go home.  D/C pacing wires.  Tentatively plan d/c home this afternoon if rhythm stable.  [name], MD 09/01/2018

## 2018-09-01 NOTE — Progress Notes (Signed)
PACER box turned off and EPW pulled per protocol and as ordered. All ends intact. Rt atrial wires met with slight resistance but were removed. bp 148/73 sats 95 on 1L [place]. Patient reminded to lie supine approximatley one hour. Will monitor patient. Jacelynn Hayton, Bettina Gavia rN

## 2018-09-01 NOTE — Progress Notes (Signed)
While ambulating Patient with a 19 beat of wide complex tachy cardia. Patient back in room in chair. Lars Pinks [facility] made aware and stated Per Dr. Roxy Manns OK to pull wires today. Will monitor patient. Mr..e RN

## 2018-09-01 NOTE — Progress Notes (Signed)
Pt ambulated x 470 feet around pt tolerated well now weaned down to 1 liter [place]

## 2018-09-01 NOTE — Progress Notes (Signed)
CARDIAC REHAB PHASE I   PRE:  Rate/Rhythm: 69 SR    BP: sitting 127/71    SaO2: 95-98 1L  MODE:  Ambulation: 470 ft   POST:  Rate/Rhythm: 108 ST with many 3-4 bts VT or afib, occ paced beat    BP: sitting 148/69     SaO2: 89 1L, up to 93 1L  Pt stood and ambulated on 1L, no RW, standby assist. Fairly steady, has a RW at home if he needs it. Had to stop and rest due to SOB, took his mask off to help breathe. SaO2 would not register. After walk, SaO2 89 1L, up to 93 1L with rest. Pt had significant ectopy toward end of walk, see strips.  I did not walk on RA to qualify for home O2 as I was not aware the plan for d/c today. Pt to recliner and we discussed sternal precautions, IS, exercise at home, diet, and CRPII. Will refer to [place].  Pt is interested in participating in Virtual Cardiac Rehab. Pt advised that Virtual Cardiac Rehab is provided at no cost to the patient.  Checklist:  1. Pt has smart device  ie smartphone and/or ipad for downloading an app  Yes 2. Reliable internet/wifi service    Yes 3. Understands how to use their smartphone and navigate within an app.  Yes  Reviewed with pt the scheduling process for virtual cardiac rehab.  Pt verbalized understanding. [phone]  [place], ACSM 09/01/2018 11:19 AM

## 2018-09-02 LAB — CBC
HCT: 30.1 % — ABNORMAL LOW (ref 39.0–52.0)
Hemoglobin: 10.2 g/dL — ABNORMAL LOW (ref 13.0–17.0)
MCH: 31.5 pg (ref 26.0–34.0)
MCHC: 33.9 g/dL (ref 30.0–36.0)
MCV: 92.9 fL (ref 80.0–100.0)
Platelets: 177 10*3/uL (ref 150–400)
RBC: 3.24 MIL/uL — ABNORMAL LOW (ref 4.22–5.81)
RDW: 13.3 % (ref 11.5–15.5)
WBC: 9 10*3/uL (ref 4.0–10.5)
nRBC: 0.2 % (ref 0.0–0.2)

## 2018-09-02 LAB — BASIC METABOLIC PANEL
Anion gap: 9 (ref 5–15)
BUN: 32 mg/dL — ABNORMAL HIGH (ref 8–23)
CO2: 28 mmol/L (ref 22–32)
Calcium: 8.3 mg/dL — ABNORMAL LOW (ref 8.9–10.3)
Chloride: 104 mmol/L (ref 98–111)
Creatinine, Ser: 1.19 mg/dL (ref 0.61–1.24)
GFR calc Af Amer: >60 mL/min (ref >60)
GFR calc non Af Amer: 58 mL/min — ABNORMAL LOW (ref >60)
Glucose, Bld: 101 mg/dL — ABNORMAL HIGH (ref 70–99)
Potassium: 3.2 mmol/L — ABNORMAL LOW (ref 3.5–5.1)
Sodium: 141 mmol/L (ref 135–145)

## 2018-09-02 LAB — GLUCOSE, CAPILLARY: Glucose-Capillary: 81 mg/dL (ref 70–99)

## 2018-09-02 MED ORDER — FUROSEMIDE 40 MG PO TABS: 40.0000 mg | ORAL_TABLET | Freq: Every day | ORAL | 0 refills | Status: DC

## 2018-09-02 MED ORDER — ATORVASTATIN CALCIUM 80 MG PO TABS: 80.0000 mg | ORAL_TABLET | Freq: Every day | ORAL | 1 refills | Status: DC

## 2018-09-02 MED ORDER — METOPROLOL TARTRATE 25 MG PO TABS: 12.5000 mg | ORAL_TABLET | Freq: Two times a day (BID) | ORAL | 1 refills | Status: DC

## 2018-09-02 MED ORDER — CLOPIDOGREL BISULFATE 75 MG PO TABS: 75.0000 mg | ORAL_TABLET | Freq: Every day | ORAL | 1 refills | Status: DC

## 2018-09-02 MED ORDER — LOSARTAN POTASSIUM 50 MG PO TABS: 100.0000 mg | ORAL_TABLET | Freq: Every day | ORAL | 1 refills | Status: DC

## 2018-09-02 MED ORDER — TRAMADOL HCL 50 MG PO TABS: 50.0000 mg | ORAL_TABLET | Freq: Four times a day (QID) | ORAL | 0 refills | Status: DC | PRN

## 2018-09-02 MED ORDER — POTASSIUM CHLORIDE CRYS ER 20 MEQ PO TBCR: 20.0000 meq | EXTENDED_RELEASE_TABLET | Freq: Every day | ORAL | 0 refills | Status: DC

## 2018-09-02 MED ORDER — POTASSIUM CHLORIDE CRYS ER 10 MEQ PO TBCR
40.0000 meq | EXTENDED_RELEASE_TABLET | Freq: Once | ORAL | Status: AC
  Administered 2018-09-02: 40 meq via ORAL
  Filled 2018-09-02: qty 4

## 2018-09-02 NOTE — Progress Notes (Addendum)
6 Days Post-Op Procedure(s) (LRB): EXPLORATION POST-OP HEART FOR BLEEDING (N/A) Subjective: Says he feels good this morning.  He is anxious to go home.   Objective: Vital signs in last 24 hours: Temp:  [97.7 F (36.5 C)-98.8 F (37.1 C)] 97.7 F (36.5 C) (06/30 0753) Pulse Rate:  [63-74] 69 (06/30 0753) Cardiac Rhythm: Normal sinus rhythm;Bundle branch block (06/29 1900) Resp:  [11-26] 19 (06/30 0753) BP: (109-148)/(66-81) 129/70 (06/30 0753) SpO2:  [90 %-96 %] 95 % (06/30 0753) Weight:  [69.9 kg] 69.9 kg (06/30 0447)     Intake/Output from previous day: 06/29 0701 - 06/30 0700 In: 720 [P.O.:720] Out: 2452 [Urine:1250; Stool:1202] Intake/Output this shift: No intake/output data recorded.    General- alert and comfortable   Lungs- clear without rales, wheezes   Cor- regular rate and rhythm, no murmur , gallop. Monitor    reviewed, stable SR with no further tachy or brady           arrhythmias.   Abdomen- soft, non-tender   Extremities - warm, non-tender, no edema   Neuro- oriented, appropriate, no focal weakness  Lab Results: Recent Labs    09/01/18 0351 09/02/18 0302  WBC 9.9 9.0  HGB 11.0* 10.2*  HCT 33.6* 30.1*  PLT 178 177   BMET:  Recent Labs    09/01/18 0351 09/02/18 0302  NA 141 141  K 3.3* 3.2*  CL 105 104  CO2 29 28  GLUCOSE 103* 101*  BUN 32* 32*  CREATININE 1.05 1.19  CALCIUM 8.1* 8.3*    PT/INR: No results for input(s): LABPROT, INR in the last 72 hours. ABG    Component Value Date/Time   PHART 7.330 (L) 08/28/2018 0605   HCO3 17.3 (L) 08/28/2018 0605   TCO2 18 (L) 08/28/2018 0605   ACIDBASEDEF 8.0 (H) 08/28/2018 0605   O2SAT 91.0 08/28/2018 0605   CBG (last 3)  Recent Labs    09/01/18 1103 09/01/18 1611 09/02/18 0656  GLUCAP 134* 84 81    Assessment/Plan: S/P Procedure(s) (LRB): EXPLORATION POST-OP HEART FOR BLEEDING (N/A)  POD3CABG x 6  for MVCAD, acute NSTEMI, EF 50%. Re-op for post-op bleeding related to coagulopathy.  No surgical bleed identified. Coagulopathy corrected with blood products and Factor VII.    -Post-op atrial fibrillation-Amiodarone load initiated but he developed bradycardia so it was discontinued.  Had return of SR but had a run of SVT yesterday while walking. Evaluated by cardiology and the decision was made to add low-dose B-blocker. He has maintained SR overnight.   -Hypokalemia- K+ 3.3. this am. Supplement.   -Respiratory insufficiency secondary to significant volume overload.Good response to diuresis. Now on RA.   -Expected acute blood loss anemia- appropriate response to transfused PRBC's,  Stable Hct Monitor.  Anticipate discharge later today.    LOS: 8 days    Antony Odea, PA-C [phone] 09/02/2018   I have seen and examined the patient and agree with the assessment and plan as outlined.  D/C home  [name], MD 09/02/2018 9:21 AM

## 2018-09-02 NOTE — Care Management Important Message (Signed)
Important Message  Patient Details  Name: Zachary [name] MRN: [id number] Date of Birth: [birth year]   Medicare Important Message Given:  Yes     [name] 09/02/2018, 12:31 PM

## 2018-09-02 NOTE — Progress Notes (Signed)
CARDIAC REHAB PHASE I   PRE:  Rate/Rhythm: 66 SR    BP: sitting 129/73    SaO2: 95 RA  MODE:  Ambulation: 470 ft   POST:  Rate/Rhythm: 90 SR with 3-4 bt runs ? SVT    BP: sitting 146/76     SaO2: 96-97 RA  Pt doing well today. Able to walk on RA, SaO2  96-97 RA on ear with great pleth. No rest needed. Pt did begin to have 3-4 bts of ectopy, several bouts, at end of walk. Asx, resolved with rest. Encouraged pt to sit and rest if he ever gets dizzy or significant SOB at home walking. [place], ACSM 09/02/2018 9:01 AM

## 2018-09-02 NOTE — Progress Notes (Signed)
Patient in a stable condition, discharge education reviewed with patient he verbalized understanding, iv removed, tele dc ccmd notified, patient belongings at bedside, patient to be transported home by his wife.  

## 2018-09-04 ENCOUNTER — Telehealth (HOSPITAL_COMMUNITY): Payer: Self-pay

## 2018-09-04 NOTE — Telephone Encounter (Signed)
Called pt to see if he was interested in Cardiac rehab and virtual cardiac rehab.. pt is not interested at this time. Closed referral. Tedra [place]. Support Rep II

## 2018-09-04 NOTE — Telephone Encounter (Signed)
Pt insurance is active and benefits verified through [employer] adv Co-pay $15.00, DED 0/0 met, out of pocket $3,400/0 met, co-insurance 0. no pre-authorization required, REF# [id number]  Will contact patient to see if he is interested in the Cardiac Rehab Program. If interested, patient will need to complete follow up appt. Once completed, patient will be contacted for scheduling upon review by the RN Navigator.

## 2018-09-05 ENCOUNTER — Other Ambulatory Visit: Payer: Self-pay | Admitting: Physician Assistant

## 2018-09-07 ENCOUNTER — Emergency Department (HOSPITAL_COMMUNITY): Payer: PPO

## 2018-09-07 ENCOUNTER — Emergency Department (HOSPITAL_COMMUNITY)
Admission: EM | Admit: 2018-09-07 | Discharge: 2018-09-07 | Disposition: A | Discharge status: Home / Self Care | Payer: PPO | Attending: Emergency Medicine | Admitting: Emergency Medicine

## 2018-09-07 ENCOUNTER — Other Ambulatory Visit: Payer: Self-pay

## 2018-09-07 ENCOUNTER — Encounter (HOSPITAL_COMMUNITY): Payer: Self-pay | Admitting: Emergency Medicine

## 2018-09-07 DIAGNOSIS — R0602 Shortness of breath: Secondary | ICD-10-CM | POA: Diagnosis not present

## 2018-09-07 DIAGNOSIS — Z87891 Personal history of nicotine dependence: Secondary | ICD-10-CM | POA: Diagnosis not present

## 2018-09-07 DIAGNOSIS — Z79899 Other long term (current) drug therapy: Secondary | ICD-10-CM | POA: Insufficient documentation

## 2018-09-07 DIAGNOSIS — J90 Pleural effusion, not elsewhere classified: Secondary | ICD-10-CM | POA: Diagnosis not present

## 2018-09-07 DIAGNOSIS — Z7901 Long term (current) use of anticoagulants: Secondary | ICD-10-CM | POA: Insufficient documentation

## 2018-09-07 DIAGNOSIS — J918 Pleural effusion in other conditions classified elsewhere: Secondary | ICD-10-CM | POA: Diagnosis not present

## 2018-09-07 LAB — CBC
HCT: 40 % (ref 39.0–52.0)
Hemoglobin: 13 g/dL (ref 13.0–17.0)
MCH: 31.7 pg (ref 26.0–34.0)
MCHC: 32.5 g/dL (ref 30.0–36.0)
MCV: 97.6 fL (ref 80.0–100.0)
Platelets: 356 10*3/uL (ref 150–400)
RBC: 4.1 MIL/uL — ABNORMAL LOW (ref 4.22–5.81)
RDW: 14.7 % (ref 11.5–15.5)
WBC: 16.8 10*3/uL — ABNORMAL HIGH (ref 4.0–10.5)
nRBC: 0 % (ref 0.0–0.2)

## 2018-09-07 LAB — BASIC METABOLIC PANEL
Anion gap: 12 (ref 5–15)
BUN: 16 mg/dL (ref 8–23)
CO2: 16 mmol/L — ABNORMAL LOW (ref 22–32)
Calcium: 7.3 mg/dL — ABNORMAL LOW (ref 8.9–10.3)
Chloride: 113 mmol/L — ABNORMAL HIGH (ref 98–111)
Creatinine, Ser: 0.97 mg/dL (ref 0.61–1.24)
GFR calc Af Amer: >60 mL/min (ref >60)
GFR calc non Af Amer: >60 mL/min (ref >60)
Glucose, Bld: 92 mg/dL (ref 70–99)
Potassium: 3.1 mmol/L — ABNORMAL LOW (ref 3.5–5.1)
Sodium: 141 mmol/L (ref 135–145)

## 2018-09-07 LAB — TROPONIN I (HIGH SENSITIVITY): Troponin I (High Sensitivity): 87 ng/L — ABNORMAL HIGH (ref <18)

## 2018-09-07 LAB — BRAIN NATRIURETIC PEPTIDE: B Natriuretic Peptide: 494.3 pg/mL — ABNORMAL HIGH (ref 0.0–100.0)

## 2018-09-07 MED ORDER — POTASSIUM CHLORIDE CRYS ER 20 MEQ PO TBCR
40.0000 meq | EXTENDED_RELEASE_TABLET | Freq: Once | ORAL | Status: AC
  Administered 2018-09-07: 40 meq via ORAL
  Filled 2018-09-07: qty 2

## 2018-09-07 MED ORDER — SODIUM CHLORIDE 0.9% FLUSH: 3.0000 mL | Freq: Once | INTRAVENOUS | Status: DC

## 2018-09-07 MED ORDER — FUROSEMIDE 10 MG/ML IJ SOLN
40.0000 mg | Freq: Once | INTRAMUSCULAR | Status: AC
  Administered 2018-09-07: 40 mg via INTRAVENOUS
  Filled 2018-09-07: qty 4

## 2018-09-07 MED ORDER — MAGNESIUM OXIDE 400 (241.3 MG) MG PO TABS
800.0000 mg | ORAL_TABLET | Freq: Once | ORAL | Status: AC
  Administered 2018-09-07: 13:00:00 800 mg via ORAL
  Filled 2018-09-07: qty 2

## 2018-09-07 MED ORDER — FUROSEMIDE 40 MG PO TABS: 40.0000 mg | ORAL_TABLET | Freq: Two times a day (BID) | ORAL | 0 refills | Status: DC

## 2018-09-07 NOTE — ED Triage Notes (Signed)
Pt. Stated, Its like I can get my breath but just not all the way. If Im walking and take little breaths Im fine and sit back down Im feeling pressure.

## 2018-09-07 NOTE — Discharge Instructions (Signed)
The cardiologist suggested that you double your Lasix, take 40 mg twice a day.  Your potassium was low and so continue your potassium supplement.  Also eat of food that is high in potassium with each meal.  Cardiology should schedule you for an appointment this week.  You can certainly call them on Monday or Tuesday if you have not heard anything.

## 2018-09-07 NOTE — ED Notes (Signed)
Pt verbalized understanding of discharge paperwork, follow-up care. Discharged home with wife

## 2018-09-07 NOTE — ED Triage Notes (Signed)
Pt. Stated, Im having SOB, I had By=pass surgery on June 30.

## 2018-09-07 NOTE — ED Provider Notes (Signed)
[facility] EMERGENCY DEPARTMENT Provider Note   CSN: [id number] Arrival date & time: 09/07/18  7591    History   Chief Complaint Chief Complaint  Patient presents with  . Shortness of Breath    HPI Zachary [name] is a 78 y.o. male.     78 yo M with a cc of sob.  Occurs with sitting or lying flat.  No chest pain or pressure.  No cough, no fever.  Feels his abdomen is swollen.  Swelling to R leg which was graft site.  Felt that his wounds have finally closed on his leg.  Patient has been sleeping sitting up with 2 pillows which is different.  Has been that way post surgery.  Just had bypass surgery done on the 22nd of last month and had a repeat exploratory surgery to evaluate for surgical bleeding.  He has been able to ambulate without significant shortness of breath.  The history is provided by the patient.  Shortness of Breath Severity:  Moderate Onset quality:  Gradual Duration:  2 days Timing:  Constant Progression:  Worsening Chronicity:  New Context comment:  Sitting and lying flat Relieved by:  Nothing Worsened by:  Nothing Ineffective treatments:  None tried Associated symptoms: no abdominal pain, no chest pain, no fever, no headaches, no rash and no vomiting     Past Medical History:  Diagnosis Date  . Essential hypertension 08/27/2018  . Hypertension   . 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 Atrial fibrillation ([place]) 08/29/2018  . 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 Essential hypertension   . NSTEMI (non-ST elevated myocardial infarction) ([place]) 08/25/2018    Past Surgical History:  Procedure Laterality Date  . CHONDROPLASTY  06/25/2014   Procedure: CHONDROPLASTY;  Surgeon: Melrose Nakayama, MD;  Location: [facility];  Service: Orthopedics;;  . CORONARY ARTERY BYPASS GRAFT N/A 08/27/2018   Procedure: CORONARY  ARTERY BYPASS GRAFTING (CABG) x four,  using left internal mammary artery and right leg greater saphenous vein harvested endoscopically;  Surgeon: [name], MD;  Location: [facility];  Service: Open Heart Surgery;  Laterality: N/A;  . CORONARY ARTERY BYPASS GRAFT N/A 08/27/2018   Procedure: EXPLORATION POST-OP HEART FOR BLEEDING;  Surgeon: [name], MD;  Location: [facility];  Service: Open Heart Surgery;  Laterality: N/A;  . Growth removed from nose     age 29 yrs.  Marland Kitchen KNEE ARTHROSCOPY WITH MEDIAL MENISECTOMY Left 06/25/2014   Procedure: LEFT ARTHROSCOPY KNEE,partial medial menisectomy;  Surgeon: Melrose Nakayama, MD;  Location: [facility];  Service: Orthopedics;  Laterality: Left;  . LEFT HEART CATH AND CORONARY ANGIOGRAPHY N/A 08/26/2018   Procedure: LEFT HEART CATH AND CORONARY ANGIOGRAPHY;  Surgeon: Troy Sine, MD;  Location: [facility];  Service: Cardiovascular;  Laterality: N/A;  . Lower back surgery     Pinched nerve - 2010  . TEE WITHOUT CARDIOVERSION N/A 08/27/2018   Procedure: TRANSESOPHAGEAL ECHOCARDIOGRAM (TEE);  Surgeon: [name], MD;  Location: [facility];  Service: Open Heart Surgery;  Laterality: N/A;  . Upper back surgery     Plate and pins        Home Medications    Prior to Admission medications   Medication Sig Start Date End Date Taking? Authorizing Provider  acetaminophen (TYLENOL) 500 MG tablet Take 500 mg by mouth every 6 (six) hours as needed for headache (pain).     [provider]  aspirin EC 81 MG tablet Take 81 mg by mouth every other day.    [provider]  atorvastatin (LIPITOR) 80 MG tablet Take 1 tablet (80 mg total) by mouth daily at 6 PM. 09/02/18   Nani Skillern, PA-C  clopidogrel (PLAVIX) 75 MG tablet Take 1 tablet (75 mg total) by mouth daily. 09/03/18   Nani Skillern, PA-C  furosemide (LASIX) 40 MG tablet Take 1 tablet (40 mg total) by mouth daily. For one week then stop. 09/02/18    Nani Skillern, PA-C  furosemide (LASIX) 40 MG tablet Take 1 tablet (40 mg total) by mouth 2 (two) times daily for 7 days. 09/07/18 09/14/18  Deno Etienne, DO  hydrocortisone cream 1 % Apply 1 application topically 3 (three) times daily as needed (itchy feet).    [provider]  losartan (COZAAR) 50 MG tablet Take 2 tablets (100 mg total) by mouth daily. 09/02/18   Nani Skillern, PA-C  metoprolol tartrate (LOPRESSOR) 25 MG tablet Take 0.5 tablets (12.5 mg total) by mouth 2 (two) times daily. 09/02/18   Nani Skillern, PA-C  Multiple Vitamin (MULTIVITAMIN WITH MINERALS) TABS tablet Take 1 tablet by mouth daily.    [provider]  OVER THE COUNTER MEDICATION Place 1 drop into both eyes daily as needed (itching/irritation/dry eyes). Over the counter lubricating eye drop    [provider]  potassium chloride (K-DUR) 20 MEQ tablet Take 1 tablet (20 mEq total) by mouth daily. For one week then stop. 09/03/18   Nani Skillern, PA-C  traMADol (ULTRAM) 50 MG tablet Take 1 tablet (50 mg total) by mouth every 6 (six) hours as needed for moderate pain. 09/02/18   Nani Skillern, PA-C    Family History No family history on file.  Social History Social History   Tobacco Use  . Smoking status: Former Smoker    Types: Cigarettes  . Smokeless tobacco: Never Used  . Tobacco comment: Quit smoking 50 years ago  Substance Use Topics  . Alcohol use: No  . Drug use: No     Allergies   Patient has no known allergies.   Review of Systems Review of Systems  Constitutional: Negative for chills and fever.  HENT: Negative for congestion and facial swelling.   Eyes: Negative for discharge and visual disturbance.  Respiratory: Positive for shortness of breath.   Cardiovascular: Positive for leg swelling. Negative for chest pain and palpitations.  Gastrointestinal: Negative for abdominal pain, diarrhea and vomiting.  Musculoskeletal: Negative for  arthralgias and myalgias.  Skin: Negative for color change and rash.  Neurological: Negative for tremors, syncope and headaches.  Psychiatric/Behavioral: Negative for confusion and dysphoric mood.     Physical Exam Updated Vital Signs BP 130/67   Pulse (!) 59   Temp 98.5 F (36.9 C) (Oral)   Resp 18   Ht 5\' 3"  (1.6 m)   Wt 67.1 kg   SpO2 96%   BMI 26.22 kg/m   Physical Exam Vitals signs and nursing note reviewed.  Constitutional:      Appearance: He is well-developed.  HENT:     Head: Normocephalic and atraumatic.  Eyes:     Pupils: Pupils are equal, round, and reactive to light.  Neck:     Musculoskeletal: Normal range of motion and neck supple.  Vascular: No JVD.  Cardiovascular:     Rate and Rhythm: Normal rate and regular rhythm.     Heart sounds: No murmur. No friction rub. No gallop.   Pulmonary:     Effort: No respiratory distress.     Breath sounds: No wheezing.  Abdominal:     General: There is no distension.     Tenderness: There is no guarding or rebound.     Comments: Mild abdominal distention.  Soft nontender.  Musculoskeletal: Normal range of motion.     Right lower leg: Edema present.     Left lower leg: Edema present.     Comments: Right lower extremity edema greater than left.  3+ pitting to the right and 1+ to trace on the left.  Skin:    Coloration: Skin is not pale.     Findings: No rash.  Neurological:     Mental Status: He is alert and oriented to person, place, and time.  Psychiatric:        Behavior: Behavior normal.      ED Treatments / Results  Labs (all labs ordered are listed, but only abnormal results are displayed) Labs Reviewed  BASIC METABOLIC PANEL - Abnormal; Notable for the following components:      Result Value   Potassium 3.1 (*)    Chloride 113 (*)    CO2 16 (*)    Calcium 7.3 (*)    All other components within normal limits  CBC - Abnormal; Notable for the following components:   WBC 16.8 (*)    RBC 4.10  (*)    All other components within normal limits  TROPONIN I (HIGH SENSITIVITY) - Abnormal; Notable for the following components:   Troponin I (High Sensitivity) 87 (*)    All other components within normal limits  BRAIN NATRIURETIC PEPTIDE - Abnormal; Notable for the following components:   B Natriuretic Peptide 494.3 (*)    All other components within normal limits    EKG EKG Interpretation  Date/Time:  Sunday September 07 2018 09:42:17 EDT Ventricular Rate:  71 PR Interval:  210 QRS Duration: 162 QT Interval:  428 QTC Calculation: 465 R Axis:   -77 Text Interpretation:  Sinus rhythm with 1st degree A-V block Left axis deviation Right bundle branch block Possible Lateral infarct , age undetermined Abnormal ECG No significant change since last tracing Confirmed by Deno Etienne [phone]) on 09/07/2018 10:32:02 AM   Radiology Dg Chest 2 View  Result Date: 09/07/2018 CLINICAL DATA:  Dyspnea EXAM: CHEST - 2 VIEW COMPARISON:  09/01/2018 chest radiograph. FINDINGS: Surgical hardware from [facility]CD[facility] overlies the lower cervical spine. Intact sternotomy wires. Stable cardiomediastinal silhouette with normal heart size. No pneumothorax. Small right pleural effusion. No significant left pleural effusion. No pulmonary edema. No acute consolidative airspace disease. IMPRESSION: Small right pleural effusion.  Otherwise no active disease. Electronically Signed   By: Ilona Sorrel M.D.   On: 09/07/2018 10:58    Procedures Ultrasound ED Echo  Date/Time: 09/07/2018 12:46 PM Performed by: Deno Etienne, DO Authorized by: Deno Etienne, DO   Procedure details:    Indications: dyspnea     Views: subxiphoid, parasternal long axis view, parasternal short axis view and apical 4 chamber view     Images: archived     Limitations:  Acoustic shadowing and body habitus Findings:    Pericardium: no pericardial effusion     LV Function: normal (>50% EF)     RV Diameter: normal   Impression:  Impression: normal      (including critical care time)  Medications Ordered in ED Medications  sodium chloride flush (NS) 0.9 % injection 3 mL (has no administration in time range)  potassium chloride SA (K-DUR) CR tablet 40 mEq (40 mEq Oral Given 09/07/18 1242)  magnesium oxide (MAG-OX) tablet 800 mg (800 mg Oral Given 09/07/18 1242)  furosemide (LASIX) injection 40 mg (40 mg Intravenous Given 09/07/18 1242)     Initial Impression / Assessment and Plan / ED Course  I have reviewed the triage vital signs and the nursing notes.  Pertinent labs & imaging results that were available during my care of the patient were reviewed by me and considered in my medical decision making (see chart for details).        78 yo M with a chief complaint of shortness of breath upon sitting or lying back flat.  This been going on for the past few days.  Patient recently had bypass surgery after having an NSTEMI and found to have diffuse disease.  He has been able to ambulate short distances without any symptoms.  Will obtain a laboratory evaluation chest x-ray EKG troponin CBC BMP BNP.  Patient has some mild hypokalemia which seems consistent with his prior lab values.  His chest x-ray shows a small right-sided pleural effusion.  Bedside echo with out pericardial effusion.  I discussed the case with Dr. Meda Coffee, cardiology.  Recommended that the patient double his Lasix until he is seen in the office.  She will arrange for an appointment next week.  12:47 PM:  I have discussed the diagnosis/risks/treatment options with the patient and believe the pt to be eligible for discharge home to follow-up with Cards, PCP. We also discussed returning to the ED immediately if new or worsening sx occur. We discussed the sx which are most concerning (e.g., sudden worsening sob, fever, inability to tolerate by mouth) that necessitate immediate return. Medications administered to the patient during their visit and any new prescriptions provided to the  patient are listed below.  Medications given during this visit Medications  sodium chloride flush (NS) 0.9 % injection 3 mL (has no administration in time range)  potassium chloride SA (K-DUR) CR tablet 40 mEq (40 mEq Oral Given 09/07/18 1242)  magnesium oxide (MAG-OX) tablet 800 mg (800 mg Oral Given 09/07/18 1242)  furosemide (LASIX) injection 40 mg (40 mg Intravenous Given 09/07/18 1242)     The patient appears reasonably screen and/or stabilized for discharge and I doubt any other medical condition or other [facility] requiring further screening, evaluation, or treatment in the ED at this time prior to discharge.    Final Clinical Impressions(s) / ED Diagnoses   Final diagnoses:  SOB (shortness of breath)  Pleural effusion    ED Discharge Orders         Ordered    furosemide (LASIX) 40 MG tablet  2 times daily     09/07/18 1243           Deno Etienne, DO 09/07/18 1247

## 2018-09-08 ENCOUNTER — Emergency Department (HOSPITAL_COMMUNITY)
Admission: EM | Admit: 2018-09-08 | Discharge: 2018-09-08 | Disposition: A | Discharge status: Home / Self Care | Payer: PPO | Attending: Emergency Medicine | Admitting: Emergency Medicine

## 2018-09-08 ENCOUNTER — Emergency Department (HOSPITAL_COMMUNITY): Payer: PPO

## 2018-09-08 ENCOUNTER — Emergency Department (HOSPITAL_BASED_OUTPATIENT_CLINIC_OR_DEPARTMENT_OTHER): Payer: PPO

## 2018-09-08 ENCOUNTER — Encounter (HOSPITAL_COMMUNITY): Payer: Self-pay | Admitting: Emergency Medicine

## 2018-09-08 ENCOUNTER — Other Ambulatory Visit: Payer: Self-pay

## 2018-09-08 DIAGNOSIS — I10 Essential (primary) hypertension: Secondary | ICD-10-CM | POA: Diagnosis not present

## 2018-09-08 DIAGNOSIS — Z951 Presence of aortocoronary bypass graft: Secondary | ICD-10-CM | POA: Diagnosis not present

## 2018-09-08 DIAGNOSIS — R0602 Shortness of breath: Secondary | ICD-10-CM | POA: Insufficient documentation

## 2018-09-08 DIAGNOSIS — Z7982 Long term (current) use of aspirin: Secondary | ICD-10-CM | POA: Insufficient documentation

## 2018-09-08 DIAGNOSIS — L03115 Cellulitis of right lower limb: Secondary | ICD-10-CM | POA: Diagnosis not present

## 2018-09-08 DIAGNOSIS — R2241 Localized swelling, mass and lump, right lower limb: Secondary | ICD-10-CM | POA: Diagnosis present

## 2018-09-08 DIAGNOSIS — I8001 Phlebitis and thrombophlebitis of superficial vessels of right lower extremity: Secondary | ICD-10-CM | POA: Diagnosis not present

## 2018-09-08 DIAGNOSIS — Z79899 Other long term (current) drug therapy: Secondary | ICD-10-CM | POA: Insufficient documentation

## 2018-09-08 DIAGNOSIS — R52 Pain, unspecified: Secondary | ICD-10-CM | POA: Diagnosis not present

## 2018-09-08 DIAGNOSIS — Z87891 Personal history of nicotine dependence: Secondary | ICD-10-CM | POA: Insufficient documentation

## 2018-09-08 DIAGNOSIS — I809 Phlebitis and thrombophlebitis of unspecified site: Secondary | ICD-10-CM

## 2018-09-08 DIAGNOSIS — I252 Old myocardial infarction: Secondary | ICD-10-CM | POA: Diagnosis not present

## 2018-09-08 DIAGNOSIS — Z7902 Long term (current) use of antithrombotics/antiplatelets: Secondary | ICD-10-CM | POA: Diagnosis not present

## 2018-09-08 DIAGNOSIS — M7989 Other specified soft tissue disorders: Secondary | ICD-10-CM | POA: Diagnosis not present

## 2018-09-08 LAB — CBC WITH DIFFERENTIAL/PLATELET
Abs Immature Granulocytes: 0.11 10*3/uL — ABNORMAL HIGH (ref 0.00–0.07)
Basophils Absolute: 0 10*3/uL (ref 0.0–0.1)
Basophils Relative: 0 %
Eosinophils Absolute: 0.1 10*3/uL (ref 0.0–0.5)
Eosinophils Relative: 1 %
HCT: 34.3 % — ABNORMAL LOW (ref 39.0–52.0)
Hemoglobin: 11.3 g/dL — ABNORMAL LOW (ref 13.0–17.0)
Immature Granulocytes: 1 %
Lymphocytes Relative: 7 %
Lymphs Abs: 1 10*3/uL (ref 0.7–4.0)
MCH: 32 pg (ref 26.0–34.0)
MCHC: 32.9 g/dL (ref 30.0–36.0)
MCV: 97.2 fL (ref 80.0–100.0)
Monocytes Absolute: 1.4 10*3/uL — ABNORMAL HIGH (ref 0.1–1.0)
Monocytes Relative: 9 %
Neutro Abs: 12.1 10*3/uL — ABNORMAL HIGH (ref 1.7–7.7)
Neutrophils Relative %: 82 %
Platelets: 403 10*3/uL — ABNORMAL HIGH (ref 150–400)
RBC: 3.53 MIL/uL — ABNORMAL LOW (ref 4.22–5.81)
RDW: 14.7 % (ref 11.5–15.5)
WBC: 14.7 10*3/uL — ABNORMAL HIGH (ref 4.0–10.5)
nRBC: 0 % (ref 0.0–0.2)

## 2018-09-08 LAB — COMPREHENSIVE METABOLIC PANEL
ALT: 42 U/L (ref 0–44)
AST: 36 U/L (ref 15–41)
Albumin: 3.6 g/dL (ref 3.5–5.0)
Alkaline Phosphatase: 67 U/L (ref 38–126)
Anion gap: 11 (ref 5–15)
BUN: 17 mg/dL (ref 8–23)
CO2: 25 mmol/L (ref 22–32)
Calcium: 9 mg/dL (ref 8.9–10.3)
Chloride: 100 mmol/L (ref 98–111)
Creatinine, Ser: 1.35 mg/dL — ABNORMAL HIGH (ref 0.61–1.24)
GFR calc Af Amer: 58 mL/min — ABNORMAL LOW (ref >60)
GFR calc non Af Amer: 50 mL/min — ABNORMAL LOW (ref >60)
Glucose, Bld: 125 mg/dL — ABNORMAL HIGH (ref 70–99)
Potassium: 4.1 mmol/L (ref 3.5–5.1)
Sodium: 136 mmol/L (ref 135–145)
Total Bilirubin: 1.9 mg/dL — ABNORMAL HIGH (ref 0.3–1.2)
Total Protein: 6.4 g/dL — ABNORMAL LOW (ref 6.5–8.1)

## 2018-09-08 LAB — LACTIC ACID, PLASMA
Lactic Acid, Venous: 1.3 mmol/L (ref 0.5–1.9)
Lactic Acid, Venous: 1.2 mmol/L (ref 0.5–1.9)

## 2018-09-08 LAB — TROPONIN I (HIGH SENSITIVITY)
Troponin I (High Sensitivity): 77 ng/L — ABNORMAL HIGH (ref <18)
Troponin I (High Sensitivity): 69 ng/L — ABNORMAL HIGH (ref <18)

## 2018-09-08 LAB — MAGNESIUM: Magnesium: 1.9 mg/dL (ref 1.7–2.4)

## 2018-09-08 LAB — PROTIME-INR
INR: 1.2 (ref 0.8–1.2)
Prothrombin Time: 15.3 seconds — ABNORMAL HIGH (ref 11.4–15.2)

## 2018-09-08 LAB — BRAIN NATRIURETIC PEPTIDE: B Natriuretic Peptide: 440.9 pg/mL — ABNORMAL HIGH (ref 0.0–100.0)

## 2018-09-08 LAB — PHOSPHORUS: Phosphorus: 3.9 mg/dL (ref 2.5–4.6)

## 2018-09-08 LAB — LIPASE, BLOOD: Lipase: 54 U/L — ABNORMAL HIGH (ref 11–51)

## 2018-09-08 MED ORDER — SODIUM CHLORIDE 0.9 % IV SOLN
1.0000 g | Freq: Once | INTRAVENOUS | Status: AC
  Administered 2018-09-08: 1 g via INTRAVENOUS
  Filled 2018-09-08: qty 10

## 2018-09-08 MED ORDER — CEPHALEXIN 500 MG PO CAPS: 1000.0000 mg | ORAL_CAPSULE | Freq: Two times a day (BID) | ORAL | 0 refills | Status: DC

## 2018-09-08 NOTE — ED Triage Notes (Signed)
Pt here for a wound check on his right lower leg. Pt noticed the redness and swelling this morning. Pt has recent CABG on 6/22.

## 2018-09-08 NOTE — ED Notes (Signed)
Pt verbalized understanding of discharge instructions and denies any further questions at this time.   

## 2018-09-08 NOTE — Progress Notes (Signed)
     [id number]       ,[place] [address]             [phone]       Photos reviewed Erythema more prominent at proximal incision.  Recs: 1 week course of Keflex RTC in 1 week for wound check

## 2018-09-08 NOTE — Progress Notes (Signed)
VASCULAR LAB PRELIMINARY  PRELIMINARY  PRELIMINARY  PRELIMINARY  Right lower extremity venous duplex completed.    Preliminary report:  See CV proc for preliminary results.  Gave report to   [facility], Cai Anfinson, RVT 09/08/2018, 3:47 PM

## 2018-09-08 NOTE — ED Provider Notes (Signed)
[place] EMERGENCY DEPARTMENT Provider Note   CSN: [id number] Arrival date & time: 09/08/18  1221    History   Chief Complaint No chief complaint on file.   HPI Zachary [name] is a 78 y.o. male.     HPI Patient status post bypass surgery done on June 22.  He did have a repeat exploratory surgery to evaluate for surgical bleeding.  Patient was seen in the emergency department yesterday with a chief complaint of shortness of breath and difficulty breathing with lying flat.  He denies cough or fever.  After evaluation completed patient was counseled on increasing his Lasix dose.  He comes back today stating that he is no longer having problems with the shortness of breath.  He reports he actually slept well last night but he did maintain his upright position.  He is still not lying flat. He  presents today due to observed increased redness around his vein harvest sites at the lower right leg.  He denies significant pain associated with this.  He has not had a fever.  He advises he did call the cardiothoracic surgical office and was counseled to come to the emergency department for assessment. 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   Essential hypertension    NSTEMI (non-ST elevated myocardial infarction) ([place]) 08/25/2018    Past Surgical History:  Procedure Laterality Date   CHONDROPLASTY  06/25/2014   Procedure: CHONDROPLASTY;  Surgeon: Melrose Nakayama, MD;  Location: [place];  Service: Orthopedics;;   CORONARY ARTERY BYPASS GRAFT N/A 08/27/2018   Procedure: CORONARY ARTERY BYPASS GRAFTING (CABG) x four,  using left internal mammary artery and right leg greater  saphenous vein harvested endoscopically;  Surgeon: [name], MD;  Location: [place];  Service: Open Heart Surgery;  Laterality: N/A;   CORONARY ARTERY BYPASS GRAFT N/A 08/27/2018   Procedure: EXPLORATION POST-OP HEART FOR BLEEDING;  Surgeon: [name], MD;  Location: [place];  Service: Open Heart Surgery;  Laterality: N/A;   Growth removed from nose     age 20 yrs.   KNEE ARTHROSCOPY WITH MEDIAL MENISECTOMY Left 06/25/2014   Procedure: LEFT ARTHROSCOPY KNEE,partial medial menisectomy;  Surgeon: Melrose Nakayama, MD;  Location: [place];  Service: Orthopedics;  Laterality: Left;   LEFT HEART CATH AND CORONARY ANGIOGRAPHY N/A 08/26/2018   Procedure: LEFT HEART CATH AND CORONARY ANGIOGRAPHY;  Surgeon: Troy Sine, MD;  Location: [place] [facility];  Service: Cardiovascular;  Laterality: N/A;   Lower back surgery     Pinched nerve - 2010   TEE WITHOUT CARDIOVERSION N/A 08/27/2018   Procedure: TRANSESOPHAGEAL ECHOCARDIOGRAM (TEE);  Surgeon: [name], MD;  Location: [place];  Service: Open Heart Surgery;  Laterality: N/A;   Upper back surgery     Plate and pins        Home Medications    Prior to Admission medications   Medication Sig Start Date End Date Taking? Authorizing Provider  acetaminophen (TYLENOL) 500 MG tablet Take 500 mg by mouth every 6 (six) hours as needed for headache (pain).    Yes [provider]  aspirin EC 81 MG tablet Take 81 mg by mouth every other day.   Yes [provider]  atorvastatin (LIPITOR) 80 MG tablet Take 1 tablet (80 mg total) by mouth daily at 6 PM. 09/02/18  Yes Nani Skillern, PA-C  clopidogrel (PLAVIX) 75 MG tablet Take 1 tablet (75 mg total) by mouth daily. 09/03/18  Yes Lars Pinks M, PA-C  furosemide (LASIX) 40 MG tablet Take 1 tablet (40 mg total) by mouth daily. For one week then stop. 09/02/18  Yes Lars Pinks M, PA-C  hydrocortisone cream 1 % Apply 1 application topically 3  (three) times daily as needed (itchy feet).   Yes [provider]  losartan (COZAAR) 50 MG tablet Take 2 tablets (100 mg total) by mouth daily. 09/02/18  Yes Lars Pinks M, PA-C  metoprolol tartrate (LOPRESSOR) 25 MG tablet Take 0.5 tablets (12.5 mg total) by mouth 2 (two) times daily. 09/02/18  Yes Lars Pinks M, PA-C  Multiple Vitamin (MULTIVITAMIN WITH MINERALS) TABS tablet Take 1 tablet by mouth daily.   Yes [provider]  OVER THE COUNTER MEDICATION Place 1 drop into both eyes daily as needed (itching/irritation/dry eyes). Over the counter lubricating eye drop   Yes [provider]  potassium chloride (K-DUR) 20 MEQ tablet Take 1 tablet (20 mEq total) by mouth daily. For one week then stop. 09/03/18  Yes Lars Pinks M, PA-C  traMADol (ULTRAM) 50 MG tablet Take 1 tablet (50 mg total) by mouth every 6 (six) hours as needed for moderate pain. 09/02/18  Yes Lars Pinks M, PA-C  cephALEXin (KEFLEX) 500 MG capsule Take 2 capsules (1,000 mg total) by mouth 2 (two) times daily. 09/08/18   Charlesetta Shanks, MD  furosemide (LASIX) 40 MG tablet Take 1 tablet (40 mg total) by mouth 2 (two) times daily for 7 days. 09/07/18 09/14/18  Deno Etienne, DO    Family History No family history on file.  Social History Social History   Tobacco Use   Smoking status: Former Smoker    Types: Cigarettes   Smokeless tobacco: Never Used   Tobacco comment: Quit smoking 50 years ago  Substance Use Topics   Alcohol use: No   Drug use: No     Allergies   Patient has no known allergies.   Review of Systems Review of Systems 10 Systems reviewed and are negative for acute change except as noted in the HPI.   Physical Exam Updated Vital Signs BP 124/68    Pulse 75    Temp 98.4 F (36.9 C) (Oral)    Resp 18    Ht 5\' 3"  (1.6 m)    Wt 63.5 kg    SpO2 98%    BMI 24.80 kg/m   Physical Exam Constitutional:      Appearance: He is well-developed.      Comments: Alert and nontoxic.  No respiratory distress.  Patient appears slightly pale.  HENT:     Head: Normocephalic and atraumatic.  Eyes:     Extraocular Movements: Extraocular movements intact.     Conjunctiva/sclera: Conjunctivae normal.  Neck:     Musculoskeletal: Neck supple.  Cardiovascular:     Rate and Rhythm: Normal rate and regular rhythm.     Heart sounds: Normal heart sounds.  Pulmonary:     Effort: Pulmonary effort is normal.     Breath sounds: Normal breath sounds.     Comments: Central chest wound is healing well.  No erythema around the wound margins.  Clean dry and intact.  Patient has 3 larger suture sites across the upper abdomen.  Suture still in place.  Very minimal erythema around locally the sutures. Abdominal:     General: Bowel sounds are normal. There is no distension.     Palpations: Abdomen is soft.     Tenderness: There is no abdominal tenderness.     Comments: Abdomen is soft and nontender without guarding.  Musculoskeletal:        General: Swelling present.     Right lower leg: Edema present.     Comments: Patient is vein harvest sites on the right lower extremity have some diffuse blanching erythema surrounding.  Her Salas pedis pulses 2+ and strong.  1-2+ pitting edema of the right lower extremity.  Ecchymotic changes from postoperative resolution.  See attached images.  Skin:    General: Skin is warm and dry.  Neurological:     Mental Status: He is alert and oriented to person, place, and time.     GCS: GCS eye subscore is 4. GCS verbal subscore is 5. GCS motor subscore is 6.     Coordination: Coordination normal.            ED Treatments / Results  Labs (all labs ordered are listed, but only abnormal results are displayed) Labs Reviewed  COMPREHENSIVE METABOLIC PANEL - Abnormal; Notable for the following components:      Result Value   Glucose, Bld 125 (*)    Creatinine, Ser 1.35 (*)    Total Protein 6.4 (*)    Total Bilirubin 1.9  (*)    GFR calc non Af Amer 50 (*)    GFR calc Af Amer 58 (*)    All other components within normal limits  BRAIN NATRIURETIC PEPTIDE - Abnormal; Notable for the following components:   B Natriuretic Peptide 440.9 (*)    All other components within normal limits  TROPONIN I (HIGH SENSITIVITY) - Abnormal; Notable for the following components:   Troponin I (High Sensitivity) 77 (*)    All other components within normal limits  LIPASE, BLOOD - Abnormal; Notable for the following components:   Lipase 54 (*)    All other components within normal limits  CBC WITH DIFFERENTIAL/PLATELET - Abnormal; Notable for the following components:   WBC 14.7 (*)    RBC 3.53 (*)    Hemoglobin 11.3 (*)    HCT 34.3 (*)    Platelets 403 (*)    Neutro Abs 12.1 (*)    Monocytes Absolute 1.4 (*)    Abs Immature Granulocytes 0.11 (*)    All other components within normal limits  PROTIME-INR - Abnormal; Notable for the following components:   Prothrombin Time 15.3 (*)    All other components within normal limits  CULTURE, BLOOD (ROUTINE X 2)  CULTURE, BLOOD (ROUTINE X 2)  LACTIC ACID, PLASMA  LACTIC ACID, PLASMA  MAGNESIUM  PHOSPHORUS  TROPONIN I (HIGH SENSITIVITY)  URINALYSIS, ROUTINE W REFLEX MICROSCOPIC    EKG None  Radiology Dg Chest 2 View  Result Date: 09/07/2018 CLINICAL DATA:  Dyspnea EXAM: CHEST - 2 VIEW COMPARISON:  09/01/2018 chest radiograph. FINDINGS: Surgical hardware from [facility] overlies the lower cervical spine. Intact sternotomy wires. Stable cardiomediastinal silhouette with normal heart size. No pneumothorax. Small right pleural effusion. No significant left pleural effusion. No pulmonary edema. No acute consolidative airspace disease. IMPRESSION: Small right pleural effusion.  Otherwise no active disease. Electronically Signed   By: Janina Mayo.D.  On: 09/07/2018 10:58   Dg Chest Port 1 View  Result Date: 09/08/2018 CLINICAL DATA:  Shortness of breath, status post CABG EXAM:  PORTABLE CHEST 1 VIEW COMPARISON:  09/07/2018 FINDINGS: There is no focal consolidation. There is no pleural effusion or pneumothorax. The heart and mediastinal contours are unremarkable. There is evidence of prior CABG. There is no acute osseous abnormality. IMPRESSION: No active disease. Electronically Signed   By: Kathreen Devoid   On: 09/08/2018 13:05   Vas [place] Lower Extremity Venous (dvt) (only [facility])  Result Date: 09/08/2018  Lower Venous Study Indications: Pain, Swelling, and Recent greater saphenous vein harvest for CABG.  Comparison Study: No prior study on file for comparison. Performing Technologist: Sharion Dove RVS  Examination Guidelines: A complete evaluation includes B-mode imaging, spectral Doppler, color Doppler, and power Doppler as needed of all accessible portions of each vessel. Bilateral testing is considered an integral part of a complete examination. Limited examinations for reoccurring indications may be performed as noted.  +---------+---------------+---------+-----------+----------+-------------------+  RIGHT     Compressibility Phasicity Spontaneity Properties Summary              +---------+---------------+---------+-----------+----------+-------------------+  CFV       Full            Yes       Yes                                         +---------+---------------+---------+-----------+----------+-------------------+  SFJ       None                                             Acute                +---------+---------------+---------+-----------+----------+-------------------+  FV Prox   Full                                                                  +---------+---------------+---------+-----------+----------+-------------------+  FV Mid    Full                                                                  +---------+---------------+---------+-----------+----------+-------------------+  FV Distal Full                                                                   +---------+---------------+---------+-----------+----------+-------------------+  PFV       Full                                                                  +---------+---------------+---------+-----------+----------+-------------------+  POP       Full            Yes       Yes                                         +---------+---------------+---------+-----------+----------+-------------------+  PTV       Full                                                                  +---------+---------------+---------+-----------+----------+-------------------+  PERO      Full                                                                  +---------+---------------+---------+-----------+----------+-------------------+  GSV       None                                             mid to distal calf.  +---------+---------------+---------+-----------+----------+-------------------+   +----+---------------+---------+-----------+----------+-------+  LEFT Compressibility Phasicity Spontaneity Properties Summary  +----+---------------+---------+-----------+----------+-------+  CFV  Full            Yes       Yes                             +----+---------------+---------+-----------+----------+-------+     Summary: Right: Findings consistent with acute superficial vein thrombosis involving the right great saphenous vein. Greater Saphenous vein superficial thrombosis noted in the right thigh, extending to the confluence with the common femoral vein.  *See table(s) above for measurements and observations.    Preliminary     Procedures Procedures (including critical care time)  Medications Ordered in ED Medications  cefTRIAXone (ROCEPHIN) 1 g in sodium chloride 0.9 % 100 mL IVPB (0 g Intravenous Stopped 09/08/18 1541)     Initial Impression / Assessment and Plan / ED Course  I have reviewed the triage vital signs and the nursing notes.  Pertinent labs & imaging results that were available during my care of the  patient were reviewed by me and considered in my medical decision making (see chart for details).       Consult: Patient has been reviewed with Dr. Kipp Brood and is seen by [facility] for cardiothoracic surgery.  At this time will treat for cellulitis with outpatient antibiotics and close follow-up arranged for outpatient visit.  Patient is clinically well and does not have pain complaints.  He does have increasing erythema around his vein harvest sites.  DVT study shows superficial thrombophlebitis.  Will treat empirically.  Also will get Keflex for cellulitis.  Return precautions reviewed.  Final Clinical Impressions(s) / ED Diagnoses   Final diagnoses:  Cellulitis of right lower extremity  Thrombophlebitis    ED Discharge Orders         Ordered  cephALEXin (KEFLEX) 500 MG capsule  2 times daily     09/08/18 1556           Charlesetta Shanks, MD 09/08/18 1559

## 2018-09-10 ENCOUNTER — Telehealth: Payer: Self-pay | Admitting: Cardiology

## 2018-09-10 NOTE — Telephone Encounter (Signed)
LVM, reminding pt of his appt on 09-11-18 with Kerin Ransom.

## 2018-09-11 ENCOUNTER — Ambulatory Visit (INDEPENDENT_AMBULATORY_CARE_PROVIDER_SITE_OTHER): Payer: PPO | Admitting: Cardiology

## 2018-09-11 ENCOUNTER — Other Ambulatory Visit: Payer: Self-pay

## 2018-09-11 ENCOUNTER — Other Ambulatory Visit: Payer: Self-pay | Admitting: *Deleted

## 2018-09-11 ENCOUNTER — Encounter: Payer: Self-pay | Admitting: Physician Assistant

## 2018-09-11 ENCOUNTER — Encounter: Payer: Self-pay | Admitting: Cardiology

## 2018-09-11 ENCOUNTER — Ambulatory Visit (INDEPENDENT_AMBULATORY_CARE_PROVIDER_SITE_OTHER): Payer: Self-pay | Admitting: Physician Assistant

## 2018-09-11 VITALS — BP 120/62 | HR 64 | Temp 97.3°F | Ht 63.0 in | Wt 139.8 lb

## 2018-09-11 VITALS — BP 110/68 | HR 77 | Temp 97.5°F | Resp 16 | Ht 63.0 in | Wt 140.0 lb

## 2018-09-11 DIAGNOSIS — I471 Supraventricular tachycardia, unspecified: Secondary | ICD-10-CM

## 2018-09-11 DIAGNOSIS — E785 Hyperlipidemia, unspecified: Secondary | ICD-10-CM | POA: Diagnosis not present

## 2018-09-11 DIAGNOSIS — I10 Essential (primary) hypertension: Secondary | ICD-10-CM | POA: Diagnosis not present

## 2018-09-11 DIAGNOSIS — I214 Non-ST elevation (NSTEMI) myocardial infarction: Secondary | ICD-10-CM

## 2018-09-11 DIAGNOSIS — Z951 Presence of aortocoronary bypass graft: Secondary | ICD-10-CM

## 2018-09-11 DIAGNOSIS — L03116 Cellulitis of left lower limb: Secondary | ICD-10-CM

## 2018-09-11 DIAGNOSIS — R600 Localized edema: Secondary | ICD-10-CM

## 2018-09-11 DIAGNOSIS — L0390 Cellulitis, unspecified: Secondary | ICD-10-CM | POA: Insufficient documentation

## 2018-09-11 DIAGNOSIS — I4510 Unspecified right bundle-branch block: Secondary | ICD-10-CM | POA: Diagnosis not present

## 2018-09-11 DIAGNOSIS — I2510 Atherosclerotic heart disease of native coronary artery without angina pectoris: Secondary | ICD-10-CM

## 2018-09-11 MED ORDER — POTASSIUM CHLORIDE CRYS ER 20 MEQ PO TBCR: 20.0000 meq | EXTENDED_RELEASE_TABLET | Freq: Every day | ORAL | 0 refills | Status: DC

## 2018-09-11 NOTE — Assessment & Plan Note (Signed)
PSVT post op

## 2018-09-11 NOTE — Assessment & Plan Note (Signed)
Controlled- EF 50% pre op

## 2018-09-11 NOTE — Assessment & Plan Note (Signed)
Presented 08/25/2018 with NSTEMI

## 2018-09-11 NOTE — Progress Notes (Signed)
HPI: Patient returns for shceduled postoperative follow-up having undergone CABG x 4 on 08/27/18 by Dr. Roxy Manns. The patient's early postoperative recovery while in the hospital was notable for atrial fibrillation  And SVT alternating with sinus bradycardia. He was discharged on 09/02/18 on low-dose metoprolol for in stable SR. He returned to the ED on 08/08/18 with a complaint of shortness of breath. He was evaluated by the cardiology team and advised to double his lasix dose.to which he complied. He returned to the ED the following day saying his breathing had improved but he was concerned about redness and swelling in his right lower extremity after EVH.  His WBC was declining at 14,000 from 16,000 the previous day. He was noted to have some mild erythema and tenderness over the [facility] tunnel in the lower leg and was given a dose of Rocephin in the ER and advised to take Keflex 1gm po bid and follow up in our office today.   Since ED discharge on 09/08/18,  the patient reports he has felt good with no shortness of breath.  He is continuing to use the incentive spirometer and is achieving 1250cc on the device. He has not had any fever or chills.  He denies pain in his right leg and says the swelling is improving slowly. He is taking his medications as prescribed including the Keflex started on 09/08/18.   Current Outpatient Medications  Medication Sig Dispense Refill  . acetaminophen (TYLENOL) 500 MG tablet Take 500 mg by mouth every 6 (six) hours as needed for headache (pain).     Marland Kitchen aspirin EC 81 MG tablet Take 81 mg by mouth every other day.    Marland Kitchen atorvastatin (LIPITOR) 80 MG tablet Take 1 tablet (80 mg total) by mouth daily at 6 PM. 30 tablet 1  . cephALEXin (KEFLEX) 500 MG capsule Take 2 capsules (1,000 mg total) by mouth 2 (two) times daily. 28 capsule 0  . clopidogrel (PLAVIX) 75 MG tablet Take 1 tablet (75 mg total) by mouth daily. 30 tablet 1  . furosemide (LASIX) 40 MG tablet Take 1 tablet (40 mg total)  by mouth daily. For one week then stop. 7 tablet 0  . furosemide (LASIX) 40 MG tablet Take 1 tablet (40 mg total) by mouth 2 (two) times daily for 7 days. 14 tablet 0  . hydrocortisone cream 1 % Apply 1 application topically 3 (three) times daily as needed (itchy feet).    Marland Kitchen losartan (COZAAR) 50 MG tablet Take 2 tablets (100 mg total) by mouth daily. 30 tablet 1  . metoprolol tartrate (LOPRESSOR) 25 MG tablet Take 0.5 tablets (12.5 mg total) by mouth 2 (two) times daily. 30 tablet 1  . Multiple Vitamin (MULTIVITAMIN WITH MINERALS) TABS tablet Take 1 tablet by mouth daily.    Marland Kitchen OVER THE COUNTER MEDICATION Place 1 drop into both eyes daily as needed (itching/irritation/dry eyes). Over the counter lubricating eye drop    . potassium chloride (K-DUR) 20 MEQ tablet Take 1 tablet (20 mEq total) by mouth daily. For one week then stop. 7 tablet 0  . traMADol (ULTRAM) 50 MG tablet Take 1 tablet (50 mg total) by mouth every 6 (six) hours as needed for moderate pain. 30 tablet 0   No current facility-administered medications for this visit.     Physical Exam: VS: T 97.5  BP 110/68  HR 77  RR 16   Heart-RRR  Chest- breath sounds full, equal, and CTA.  There sternotomy incision is well  approximated, clean and dry. The sternum is stable. Chest tube sites are clean, sutures removed today.  Ext's-1+ edema in RLE which is improved since he was ween in the ED on 7/6. He has mild erythema over the [facility] tunnel in the lower leg that has also improved significantly over the past 72 hours.  There is no tenderness and no increased warmth over the site.   Diagnostic Tests: None today  Impression: ~2 weeks post CABG x 4.  He developed some mild cellulitis in the setting of moderated edema in his RLE following EVH that is responding to diuresis and oral Keflex.  I asked him to continue his medications as prescribed and continue elevating the extremity when not ambulatory.    Plan: -No change in medications.  Complete lasix and Keflex as prescribed. -Follow up with Dr. Roxy Manns on 7/27 as planned.    Antony Odea, PA-C [facility] [phone]

## 2018-09-11 NOTE — Assessment & Plan Note (Signed)
LDL 107- DC'd on high dose statin Rx

## 2018-09-11 NOTE — Progress Notes (Signed)
Cardiology Office Note:    Date:  09/11/2018   ID:  Zachary [name], DOB May 30, 1940, MRN [id number]  PCP:  Josetta Huddle, MD  Cardiologist:  Dr Gwenlyn Found Electrophysiologist:  None   Referring MD: Josetta Huddle, MD   No chief complaint on file.   History of Present Illness:    Zachary [name] is a 78 y.o. male with a hx of NSTEMI 08/25/2018, status post CABG x4 08/27/2018.  His ejection fraction preop was 50%.  The patient had no prior history of CAD.  He said he had been very active, walking several miles a day up until his presentation.  At catheterization he had severe disease with high-grade mid distal RCA disease, distal LAD 90% stenosis, high-grade diagonal stenosis high-grade OM 2 stenosis and a 70% left main.  He had an LIMA to LAD, SVG to RCA, SVG to OM, and SVG to the diagonal.  He had to go back to the OR for exploration for some bleeding.  He did have PSVT post op which stabilized with beta blocker. He ultimately was discharged in stable condition.  He did have volume overload as an outpatient which improved with Lasix.    He was just in the emergency room 09/08/2018 with redness and swelling of his right leg at the venectomy site.  He was placed on Keflex.  He was seen at the surgeon's office earlier today.  The patient tells me his leg is already better.  He is otherwise recovering from his CABG.  He denies any orthopnea.  He still has some dyspnea on exertion.  Past Medical History:  Diagnosis Date  . Essential hypertension 08/27/2018  . Hypertension   . 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 CHONDROPLASTY  06/25/2014   Procedure: CHONDROPLASTY;  Surgeon: Melrose Nakayama, MD;  Location: [place];  Service: Orthopedics;;  . CORONARY ARTERY BYPASS GRAFT N/A 08/27/2018   Procedure: CORONARY ARTERY BYPASS GRAFTING (CABG) x four,  using left internal mammary  artery and right leg greater saphenous vein harvested endoscopically;  Surgeon: [name], MD;  Location: [facility];  Service: Open Heart Surgery;  Laterality: N/A;  . CORONARY ARTERY BYPASS GRAFT N/A 08/27/2018   Procedure: EXPLORATION POST-OP HEART FOR BLEEDING;  Surgeon: [name], MD;  Location: [place];  Service: Open Heart Surgery;  Laterality: N/A;  . Growth removed from nose     age 38 yrs.  Marland Kitchen KNEE ARTHROSCOPY WITH MEDIAL MENISECTOMY Left 06/25/2014   Procedure: LEFT ARTHROSCOPY KNEE,partial medial menisectomy;  Surgeon: Melrose Nakayama, MD;  Location: [place];  Service: Orthopedics;  Laterality: Left;  . LEFT HEART CATH AND CORONARY ANGIOGRAPHY N/A 08/26/2018   Procedure: LEFT HEART CATH AND CORONARY ANGIOGRAPHY;  Surgeon: Troy Sine, MD;  Location: [facility];  Service: Cardiovascular;  Laterality: N/A;  . Lower back surgery     Pinched nerve - 2010  . TEE WITHOUT CARDIOVERSION N/A 08/27/2018   Procedure: TRANSESOPHAGEAL ECHOCARDIOGRAM (TEE);  Surgeon: [name], MD;  Location: [place] [facility];  Service: Open Heart Surgery;  Laterality: N/A;  . Upper back surgery     Plate and pins    Current Medications: Current Meds  Medication Sig  . acetaminophen (TYLENOL) 500 MG tablet Take 500 mg by mouth every 6 (six) hours as  needed for headache (pain).   Marland Kitchen aspirin EC 81 MG tablet Take 81 mg by mouth every other day.  Marland Kitchen atorvastatin (LIPITOR) 80 MG tablet Take 1 tablet (80 mg total) by mouth daily at 6 PM.  . cephALEXin (KEFLEX) 500 MG capsule Take 2 capsules (1,000 mg total) by mouth 2 (two) times daily.  . clopidogrel (PLAVIX) 75 MG tablet Take 1 tablet (75 mg total) by mouth daily.  . furosemide (LASIX) 40 MG tablet Take 1 tablet (40 mg total) by mouth 2 (two) times daily for 7 days.  . hydrocortisone cream 1 % Apply 1 application topically 3 (three) times daily as needed (itchy feet).  Marland Kitchen losartan (COZAAR) 50 MG tablet Take 2 tablets (100 mg total) by  mouth daily.  . metoprolol tartrate (LOPRESSOR) 25 MG tablet Take 0.5 tablets (12.5 mg total) by mouth 2 (two) times daily.  . Multiple Vitamin (MULTIVITAMIN WITH MINERALS) TABS tablet Take 1 tablet by mouth daily.  Marland Kitchen OVER THE COUNTER MEDICATION Place 1 drop into both eyes daily as needed (itching/irritation/dry eyes). Over the counter lubricating eye drop  . potassium chloride SA (K-DUR) 20 MEQ tablet Take 1 tablet (20 mEq total) by mouth daily. For one week then stop.  . traMADol (ULTRAM) 50 MG tablet Take 1 tablet (50 mg total) by mouth every 6 (six) hours as needed for moderate pain.     Allergies:   Patient has no known allergies.   Social History   Socioeconomic History  . Marital status: Married    Spouse name: Not on file  . Number of children: Not on file  . Years of education: Not on file  . Highest education level: Not on file  Occupational History  . Not on file  Social Needs  . Financial resource strain: Not on file  . Food insecurity    Worry: Not on file    Inability: Not on file  . Transportation needs    Medical: Not on file    Non-medical: Not on file  Tobacco Use  . Smoking status: Former Smoker    Types: Cigarettes  . Smokeless tobacco: Never Used  . Tobacco comment: Quit smoking 50 years ago  Substance and Sexual Activity  . Alcohol use: No  . Drug use: No  . Sexual activity: Not on file  Lifestyle  . Physical activity    Days per week: Not on file    Minutes per session: Not on file  . Stress: Not on file  Relationships  . Social Herbalist on phone: Not on file    Gets together: Not on file    Attends religious service: Not on file    Active member of club or organization: Not on file    Attends meetings of clubs or organizations: Not on file    Relationship status: Not on file  Other Topics Concern  . Not on file  Social History Narrative  . Not on file     Family History: The patient's family history includes Heart attack  (age of onset: 95) in his father.  ROS:   Please see the history of present illness.  All other systems reviewed and are negative.  EKGs/Labs/Other Studies Reviewed:    The following studies were reviewed today: Cath 08/27/2018  EKG:  EKG is not ordered today.  The ekg ordered 09/07/2018 demonstrates NSR, RBBB, LAD, HR 74  Recent Labs: 09/08/2018: ALT 42; B Natriuretic Peptide 440.9; BUN 17; Creatinine, Ser 1.35;  Hemoglobin 11.3; Magnesium 1.9; Platelets 403; Potassium 4.1; Sodium 136  Recent Lipid Panel    Component Value Date/Time   CHOL 160 08/26/2018 0600   TRIG 43 08/26/2018 0600   HDL 44 08/26/2018 0600   CHOLHDL 3.6 08/26/2018 0600   VLDL 9 08/26/2018 0600   LDLCALC 107 (H) 08/26/2018 0600    Physical Exam:    VS:  BP 120/62   Pulse 64   Temp (!) 97.3 F (36.3 C)   Ht 5\' 3"  (1.6 m)   Wt 139 lb 12.8 oz (63.4 kg)   BMI 24.76 kg/m     Wt Readings from Last 3 Encounters:  09/11/18 139 lb 12.8 oz (63.4 kg)  09/11/18 139 lb 15.9 oz (63.5 kg)  09/08/18 140 lb (63.5 kg)     GEN:  Well nourished, well developed in no acute distress HEENT: Normal NECK: No JVD; No carotid bruits LYMPHATICS: No lymphadenopathy CARDIAC: RRR, no murmurs, rubs, gallops RESPIRATORY:  Clear to auscultation without rales, wheezing or rhonchi  ABDOMEN: Soft, non-tender, non-distended MUSCULOSKELETAL:  1+ RLE edema and erythema at his ankle, Rt thigh ecchymosis No deformity  SKIN: Warm and dry NEUROLOGIC:  Alert and oriented x 3 PSYCHIATRIC:  Normal affect   ASSESSMENT:    NSTEMI (non-ST elevated myocardial infarction) ([place]) Presented 08/25/2018 with NSTEMI  S/P CABG x 4 LIMA to LAD, SVG to Diag, SVG to OM1, SVG to RCA, EVH via right thigh and leg 08/27/2018 Required early mediastinal exploration   PSVT (paroxysmal supraventricular tachycardia) (H[facility]) PSVT post op  Essential hypertension Controlled- EF 50% pre op  RBBB Chronic, present pre CABG  HLD (hyperlipidemia) LDL 107- DC'd  on high dose statin Rx  Cellulitis Rt LE post CABG (seen in ED 09/08/2018)  PLAN:    Dr Gwenlyn Found 3 months, lipids and CMET prior to that visit.   Medication Adjustments/Labs and Tests Ordered: Current medicines are reviewed at length with the patient today.  Concerns regarding medicines are outlined above.  Orders Placed This Encounter  Procedures  . Lipid panel  . Comprehensive Metabolic Panel (CMET)   No orders of the defined types were placed in this encounter.   Patient Instructions  Medication Instructions:  Your physician recommends that you continue on your current medications as directed. Please refer to the Current Medication list given to you today. If you need a refill on your cardiac medications before your next appointment, please call your pharmacy.   Lab work: Your physician recommends that you return for lab work in: COMPLETE BEFORE APPT WITH DR BERRY-FASTING LIPID, CMET  If you have labs (blood work) drawn today and your tests are completely normal, you will receive your results only by: Marland Kitchen MyChart Message (if you have MyChart) OR . A paper copy in the mail If you have any lab test that is abnormal or we need to change your treatment, we will call you to review the results.  Testing/Procedures: None   Follow-Up: At [facility], you and your health needs are our priority.  As part of our continuing mission to provide you with exceptional heart care, we have created designated Provider Care Teams.  These Care Teams include your primary Cardiologist (physician) and Advanced Practice Providers (APPs -  Physician Assistants and Nurse Practitioners) who all work together to provide you with the care you need, when you need it. You will need a follow up appointment in 3 months.  Please call our office 2 months in advance to schedule this appointment.  You may see Dr Quay Burow or one of the following Advanced Practice Providers on your designated Care Team:   Kerin Ransom, PA-C Roby Lofts, [place] . Sande Rives, PA-C  Any Other Special Instructions Will Be Listed Below (If Applicable).      Signed, Kerin Ransom, PA-C  09/11/2018 2:39 PM    [facility]

## 2018-09-11 NOTE — Assessment & Plan Note (Signed)
Chronic, present pre CABG

## 2018-09-11 NOTE — Patient Instructions (Signed)
-  Continue elevating the right leg when sedentary.  -Continue Lasix and Keflex (cephelexin) as prescribed -Follow up with Dr. Roxy Manns as scheduled

## 2018-09-11 NOTE — Assessment & Plan Note (Signed)
LIMA to LAD, SVG to Diag, SVG to OM1, SVG to RCA, EVH via right thigh and leg 08/27/2018 Required early mediastinal exploration

## 2018-09-11 NOTE — Assessment & Plan Note (Signed)
Rt LE post CABG (seen in ED 09/08/2018)

## 2018-09-11 NOTE — Patient Instructions (Addendum)
Medication Instructions:  Your physician recommends that you continue on your current medications as directed. Please refer to the Current Medication list given to you today. If you need a refill on your cardiac medications before your next appointment, please call your pharmacy.   Lab work: Your physician recommends that you return for lab work in: COMPLETE BEFORE APPT WITH DR BERRY-FASTING LIPID, CMET  If you have labs (blood work) drawn today and your tests are completely normal, you will receive your results only by: Marland Kitchen MyChart Message (if you have MyChart) OR . A paper copy in the mail If you have any lab test that is abnormal or we need to change your treatment, we will call you to review the results.  Testing/Procedures: None   Follow-Up: At [facility], you and your health needs are our priority.  As part of our continuing mission to provide you with exceptional heart care, we have created designated Provider Care Teams.  These Care Teams include your primary Cardiologist (physician) and Advanced Practice Providers (APPs -  Physician Assistants and Nurse Practitioners) who all work together to provide you with the care you need, when you need it. You will need a follow up appointment in 3 months.  Please call our office 2 months in advance to schedule this appointment.  You may see Dr Quay Burow or one of the following Advanced Practice Providers on your designated Care Team:   Kerin Ransom, PA-C Roby Lofts, [place] . Sande Rives, PA-C  Any Other Special Instructions Will Be Listed Below (If Applicable).

## 2018-09-13 LAB — CULTURE, BLOOD (ROUTINE X 2)
Culture: NO GROWTH
Culture: NO GROWTH
Special Requests: ADEQUATE

## 2018-09-14 ENCOUNTER — Other Ambulatory Visit: Payer: Self-pay | Admitting: Physician Assistant

## 2018-09-14 DIAGNOSIS — R600 Localized edema: Secondary | ICD-10-CM

## 2018-09-16 ENCOUNTER — Ambulatory Visit: Payer: PPO | Admitting: Cardiology

## 2018-09-26 ENCOUNTER — Other Ambulatory Visit: Payer: Self-pay | Admitting: Thoracic Surgery (Cardiothoracic Vascular Surgery)

## 2018-09-26 DIAGNOSIS — Z951 Presence of aortocoronary bypass graft: Secondary | ICD-10-CM

## 2018-09-29 ENCOUNTER — Other Ambulatory Visit: Payer: Self-pay

## 2018-09-29 ENCOUNTER — Ambulatory Visit
Admission: RE | Admit: 2018-09-29 | Discharge: 2018-09-29 | Disposition: A | Discharge status: Home / Self Care | Payer: PPO | Source: Ambulatory Visit | Attending: Thoracic Surgery (Cardiothoracic Vascular Surgery) | Admitting: Thoracic Surgery (Cardiothoracic Vascular Surgery)

## 2018-09-29 ENCOUNTER — Ambulatory Visit (INDEPENDENT_AMBULATORY_CARE_PROVIDER_SITE_OTHER): Payer: Self-pay | Admitting: Thoracic Surgery (Cardiothoracic Vascular Surgery)

## 2018-09-29 ENCOUNTER — Encounter: Payer: Self-pay | Admitting: Thoracic Surgery (Cardiothoracic Vascular Surgery)

## 2018-09-29 VITALS — BP 165/84 | HR 67 | Temp 97.5°F | Resp 20 | Ht 63.0 in | Wt 140.0 lb

## 2018-09-29 DIAGNOSIS — Z951 Presence of aortocoronary bypass graft: Secondary | ICD-10-CM

## 2018-09-29 DIAGNOSIS — I2510 Atherosclerotic heart disease of native coronary artery without angina pectoris: Secondary | ICD-10-CM

## 2018-09-29 NOTE — Patient Instructions (Addendum)
Continue all previous medications without any changes at this time  Continue to avoid any heavy lifting or strenuous use of your arms or shoulders for at least a total of three months from the time of surgery.  After three months you may gradually increase how much you lift or otherwise use your arms or chest as tolerated, with limits based upon whether or not activities lead to the return of significant discomfort.  You may return to driving an automobile as long as you are no longer requiring oral narcotic pain relievers during the daytime.  It would be wise to start driving only short distances during the daylight and gradually increase from there as you feel comfortable.  You are encouraged to enroll and participate in the outpatient cardiac rehab program beginning as soon as practical.  Make every effort to stay physically active, get some type of exercise on a regular basis, and stick to a "heart healthy diet".  The long term benefits for regular exercise and a healthy diet are critically important to your overall health and wellbeing.

## 2018-09-29 NOTE — Progress Notes (Signed)
[address]       [place] [address]             [phone]     CARDIOTHORACIC SURGERY OFFICE NOTE  Referring Provider is Troy Sine, MD Primary Cardiologist is Quay Burow, MD PCP is Josetta Huddle, MD   HPI:  Patient is a 78 year old male with history of hypertension who underwent coronary artery bypass grafting x4 on August 27, 2022 severe three-vessel coronary artery disease status post acute non-ST segment elevation myocardial infarction.  His early postoperative recovery in the hospital was notable for paroxysmal supraventricular tachycardia.  Following hospital discharge she developed cellulitis involving his right lower leg below his endoscopic vein harvest incision for which she was treated with antibiotics.  He was last seen here in our office on September 11, 2018 at which time he was recovering well and making good progress.  He returns her office today and reports that he is doing very well.  He no longer has any soreness in his chest.  He denies shortness of breath.  He has not had any exertional chest discomfort.  He still has mild soreness at the right ankle but this is improving.  He is walking every day without difficulty and overall he feels quite well.   Current Outpatient Medications  Medication Sig Dispense Refill  . acetaminophen (TYLENOL) 500 MG tablet Take 500 mg by mouth every 6 (six) hours as needed for headache (pain).     Marland Kitchen aspirin EC 81 MG tablet Take 81 mg by mouth every other day.    Marland Kitchen atorvastatin (LIPITOR) 80 MG tablet Take 1 tablet (80 mg total) by mouth daily at 6 PM. 30 tablet 1  . clopidogrel (PLAVIX) 75 MG tablet Take 1 tablet (75 mg total) by mouth daily. 30 tablet 1  . losartan (COZAAR) 50 MG tablet Take 2 tablets (100 mg total) by mouth daily. 30 tablet 1  . metoprolol tartrate (LOPRESSOR) 25 MG tablet Take 0.5 tablets (12.5 mg total) by mouth 2 (two) times daily. 30 tablet 1  . Multiple Vitamin (MULTIVITAMIN WITH MINERALS)  TABS tablet Take 1 tablet by mouth daily.    Marland Kitchen OVER THE COUNTER MEDICATION Place 1 drop into both eyes daily as needed (itching/irritation/dry eyes). Over the counter lubricating eye drop     No current facility-administered medications for this visit.       Physical Exam:   BP (!) 165/84   Pulse 67   Temp (!) 97.5 F (36.4 C) (Skin)   Resp 20   Ht 5\' 3"  (1.6 m)   Wt 140 lb (63.5 kg)   SpO2 95% Comment: RA  BMI 24.80 kg/m   General:  Well-appearing  Chest:   Clear to auscultation  CV:   Regular rate and rhythm without murmur  Incisions:  Healing nicely, sternum is stable  Abdomen:  Soft nontender  Extremities:  Warm and well-perfused with slight right lower extremity edema and trace erythema at the ankle  Diagnostic Tests:  I have personally reviewed patient's chest x-ray performed this afternoon which has not yet been officially reported by a radiologist.  The chest x-ray appears clear.  There are no pleural effusions.  All the sternal wires appear intact.  No acute abnormalities are noted.   Impression:  Patient is doing very well approximately 1 month status post coronary artery bypass grafting  Plan:  We have not recommended any change to the patient's current medications.  I  have encouraged the patient to continue to gradually increase his physical activity as tolerated but to refrain from any heavy lifting or strenuous use of his arms or shoulders for at least another 2 months.  I encouraged him to enroll in the cardiac rehab program.  All of his questions have been addressed.  The patient will continue to follow-up with Dr. Gwenlyn Found and return to our office next June, approximately 1 year following his surgery for routine follow-up.  He will call and return sooner should specific problems or questions arise.    Valentina Gu. Roxy Manns, MD 09/29/2018 3:07 PM

## 2018-10-28 ENCOUNTER — Other Ambulatory Visit: Payer: Self-pay | Admitting: Physician Assistant

## 2018-11-25 ENCOUNTER — Other Ambulatory Visit: Payer: Self-pay | Admitting: Cardiothoracic Surgery

## 2018-12-09 ENCOUNTER — Encounter: Payer: Self-pay | Admitting: Cardiovascular Disease

## 2018-12-09 ENCOUNTER — Ambulatory Visit: Payer: PPO | Admitting: Cardiovascular Disease

## 2018-12-09 ENCOUNTER — Other Ambulatory Visit: Payer: Self-pay

## 2018-12-09 VITALS — BP 123/73 | HR 66 | Ht 62.5 in | Wt 137.4 lb

## 2018-12-09 DIAGNOSIS — E785 Hyperlipidemia, unspecified: Secondary | ICD-10-CM

## 2018-12-09 DIAGNOSIS — Z951 Presence of aortocoronary bypass graft: Secondary | ICD-10-CM | POA: Diagnosis not present

## 2018-12-09 DIAGNOSIS — I10 Essential (primary) hypertension: Secondary | ICD-10-CM

## 2018-12-09 NOTE — Assessment & Plan Note (Signed)
History of hyperlipidemia not on statin therapy with lipid profile performed 08/26/2018 revealing total cholesterol 160, LDL 107 and HDL 44.  We will repeat a lipid liver profile.

## 2018-12-09 NOTE — Assessment & Plan Note (Signed)
History of CAD status post non-STEMI 08/25/2018 with cardiac catheterization performed by Dr. Claiborne Billings the following day revealing three-vessel disease requiring revascularization surgically which was performed on 08/27/2018 by Dr. Dorian Pod.  He had CABG x4.  He has done well since.  Unfortunately he was not able to participate in cardiac rehab.  He denies chest pain or shortness of breath.

## 2018-12-09 NOTE — Patient Instructions (Signed)
Medication Instructions:  Your physician recommends that you continue on your current medications as directed. Please refer to the Current Medication list given to you today.  If you need a refill on your cardiac medications before your next appointment, please call your pharmacy.   Lab work: Your physician recommends that you return for lab work within 1 week: [place]  If you have labs (blood work) drawn today and your tests are completely normal, you will receive your results only by: Marland Kitchen MyChart Message (if you have MyChart) OR . A paper copy in the mail If you have any lab test that is abnormal or we need to change your treatment, we will call you to review the results.  Testing/Procedures: NONE  Follow-Up: At [facility], you and your health needs are our priority.  As part of our continuing mission to provide you with exceptional heart care, we have created designated Provider Care Teams.  These Care Teams include your primary Cardiologist (physician) and Advanced Practice Providers (APPs -  Physician Assistants and Nurse Practitioners) who all work together to provide you with the care you need, when you need it. You will need a follow up appointment in 6 months with Dr. Quay Burow.  Please call our office 2 months in advance to schedule this/each appointment.

## 2018-12-09 NOTE — Progress Notes (Signed)
12/09/2018 Zachary [name]   1940/08/29  [id number]  Primary Physician Josetta Huddle, MD Primary Cardiologist: Lorretta Harp MD Lupe Carney, [place]  HPI:  Zachary [name] is a 78 y.o. thin appearing married Caucasian male father of 2 children who worked at [employer] for years.  I am seeing him for the first time post hospital discharge for CABG.  His primary care provider is Dr. Inda Merlin.  He was seen by Kerin Ransom, PA-C for his first post hospital visit.  His only risk factor for ischemic heart disease is hypertension.  His father did have a myocardial infarction in early age of 40.  He was admitted with unstable angina on 08/25/2018 and had minimally positive enzymes.  Carotid catheterization performed the following day by Dr. Claiborne Billings revealed three-vessel disease with surgical anatomy.  He underwent CABG on 08/27/2018 by Dr. Dorian Pod with 4 bypass grafts placed including a LIMA to the LAD and otherwise venous conduits.  He has remained stable.  He denies chest pain or shortness of breath.  He is very active and says he does walk 90 minutes a day with 2 pound weights on each leg.   Current Meds  Medication Sig  . acetaminophen (TYLENOL) 500 MG tablet Take 500 mg by mouth every 6 (six) hours as needed for headache (pain).   Marland Kitchen amLODipine (NORVASC) 10 MG tablet Take 10 mg by mouth daily.  Marland Kitchen aspirin EC 81 MG tablet Take 81 mg by mouth every other day.  . chlorthalidone (HYGROTON) 25 MG tablet Take 25 mg by mouth daily.  Marland Kitchen losartan (COZAAR) 50 MG tablet Take 2 tablets (100 mg total) by mouth daily.  . Multiple Vitamin (MULTIVITAMIN WITH MINERALS) TABS tablet Take 1 tablet by mouth daily.  Marland Kitchen OVER THE COUNTER MEDICATION Place 1 drop into both eyes daily as needed (itching/irritation/dry eyes). Over the counter lubricating eye drop     No Known Allergies  Social History   Socioeconomic History  . Marital status: Married    Spouse name: Not on file  . Number of  children: Not on file  . Years of education: Not on file  . Highest education level: Not on file  Occupational History  . Not on file  Social Needs  . Financial resource strain: Not on file  . Food insecurity    Worry: Not on file    Inability: Not on file  . Transportation needs    Medical: Not on file    Non-medical: Not on file  Tobacco Use  . Smoking status: Former Smoker    Types: Cigarettes  . Smokeless tobacco: Never Used  . Tobacco comment: Quit smoking 50 years ago  Substance and Sexual Activity  . Alcohol use: No  . Drug use: No  . Sexual activity: Not on file  Lifestyle  . Physical activity    Days per week: Not on file    Minutes per session: Not on file  . Stress: Not on file  Relationships  . Social Herbalist on phone: Not on file    Gets together: Not on file    Attends religious service: Not on file    Active member of club or organization: Not on file    Attends meetings of clubs or organizations: Not on file    Relationship status: Not on file  . Intimate partner violence    Fear of current or ex partner: Not on file  Emotionally abused: Not on file    Physically abused: Not on file    Forced sexual activity: Not on file  Other Topics Concern  . Not on file  Social History Narrative  . Not on file     Review of Systems: General: negative for chills, fever, night sweats or weight changes.  Cardiovascular: negative for chest pain, dyspnea on exertion, edema, orthopnea, palpitations, paroxysmal nocturnal dyspnea or shortness of breath Dermatological: negative for rash Respiratory: negative for cough or wheezing Urologic: negative for hematuria Abdominal: negative for nausea, vomiting, diarrhea, bright red blood per rectum, melena, or hematemesis Neurologic: negative for visual changes, syncope, or dizziness All other systems reviewed and are otherwise negative except as noted above.    Blood pressure 123/73, pulse 66, height 5'  2.5" (1.588 m), weight 137 lb 6.4 oz (62.3 kg), SpO2 98 %.  General appearance: alert and no distress Neck: no adenopathy, no carotid bruit, no JVD, supple, symmetrical, trachea midline and thyroid not enlarged, symmetric, no tenderness/mass/nodules Lungs: clear to auscultation bilaterally Heart: regular rate and rhythm, S1, S2 normal, no murmur, click, rub or gallop Extremities: extremities normal, atraumatic, no cyanosis or edema Pulses: 2+ and symmetric Skin: Skin color, texture, turgor normal. No rashes or lesions Neurologic: Alert and oriented X 3, normal strength and tone. Normal symmetric reflexes. Normal coordination and gait  EKG not performed today  ASSESSMENT AND PLAN:   S/P CABG x 4 History of CAD status post non-STEMI 08/25/2018 with cardiac catheterization performed by Dr. Claiborne Billings the following day revealing three-vessel disease requiring revascularization surgically which was performed on 08/27/2018 by Dr. Dorian Pod.  He had CABG x4.  He has done well since.  Unfortunately he was not able to participate in cardiac rehab.  He denies chest pain or shortness of breath.  Essential hypertension History of essential hypertension with blood pressure measured today 122/73.  He is on chlorthalidone and losartan.  HLD (hyperlipidemia) History of hyperlipidemia not on statin therapy with lipid profile performed 08/26/2018 revealing total cholesterol 160, LDL 107 and HDL 44.  We will repeat a lipid liver profile.      Lorretta Harp MD FACP,FACC,FAHA, [facility] 12/09/2018 11:19 AM

## 2018-12-09 NOTE — Assessment & Plan Note (Signed)
History of essential hypertension with blood pressure measured today 122/73.  He is on chlorthalidone and losartan.

## 2018-12-16 DIAGNOSIS — E785 Hyperlipidemia, unspecified: Secondary | ICD-10-CM | POA: Diagnosis not present

## 2018-12-16 LAB — HEPATIC FUNCTION PANEL
ALT: 29 IU/L (ref 0–44)
AST: 31 IU/L (ref 0–40)
Albumin: 4.3 g/dL (ref 3.7–4.7)
Alkaline Phosphatase: 77 IU/L (ref 39–117)
Bilirubin Total: 0.6 mg/dL (ref 0.0–1.2)
Bilirubin, Direct: 0.17 mg/dL (ref 0.00–0.40)
Total Protein: 6.9 g/dL (ref 6.0–8.5)

## 2018-12-16 LAB — LIPID PANEL
Chol/HDL Ratio: 3.2 ratio (ref 0.0–5.0)
Cholesterol, Total: 181 mg/dL (ref 100–199)
HDL: 56 mg/dL (ref >39)
LDL Chol Calc (NIH): 110 mg/dL — ABNORMAL HIGH (ref 0–99)
Triglycerides: 80 mg/dL (ref 0–149)
VLDL Cholesterol Cal: 15 mg/dL (ref 5–40)

## 2018-12-19 ENCOUNTER — Other Ambulatory Visit: Payer: Self-pay

## 2018-12-19 DIAGNOSIS — E785 Hyperlipidemia, unspecified: Secondary | ICD-10-CM

## 2018-12-19 DIAGNOSIS — Z79899 Other long term (current) drug therapy: Secondary | ICD-10-CM

## 2018-12-19 MED ORDER — ATORVASTATIN CALCIUM 40 MG PO TABS: 40.0000 mg | ORAL_TABLET | Freq: Every day | ORAL | 6 refills | Status: DC

## 2018-12-19 NOTE — Progress Notes (Signed)
Notes recorded by Lorretta Harp, MD on 12/16/2018 at 4:55 PM EDT  Not at goal for secondary prevention. Start atorvastatin 40 mg a day, co-Q10 200 mg a day and recheck lipid liver profile in 2 months

## 2019-03-16 ENCOUNTER — Ambulatory Visit: Payer: Medicare Other | Attending: Internal Medicine

## 2019-03-16 DIAGNOSIS — Z23 Encounter for immunization: Secondary | ICD-10-CM | POA: Insufficient documentation

## 2019-03-16 NOTE — Progress Notes (Signed)
   Covid-19 Vaccination Clinic  Name:  RIGOVERTO STONEBARGER    MRN: [id number] DOB: [birth year]  03/16/2019  Mr. Cosens was observed post Covid-19 immunization for 15 minutes without incidence. He was provided with Vaccine Information Sheet and instruction to access the V-Safe system.   Mr. Kesten was instructed to call 911 with any severe reactions post vaccine: Marland Kitchen Difficulty breathing  . Swelling of your face and throat  . A fast heartbeat  . A bad rash all over your body  . Dizziness and weakness    Immunizations Administered    Name Date Dose VIS Date Route   Pfizer COVID-19 Vaccine 03/16/2019  9:14 AM 0.3 mL 02/13/2019 Intramuscular   Manufacturer: [place]   Lot: [id number]   [place]: [id number]

## 2019-04-03 ENCOUNTER — Ambulatory Visit: Payer: PPO

## 2019-04-04 ENCOUNTER — Ambulatory Visit: Payer: PPO | Attending: Internal Medicine

## 2019-04-04 DIAGNOSIS — Z23 Encounter for immunization: Secondary | ICD-10-CM | POA: Insufficient documentation

## 2019-04-04 NOTE — Progress Notes (Signed)
   Covid-19 Vaccination Clinic  Name:  Zachary [name]    MRN: [id number] DOB: 1940-10-24  04/04/2019  [name] was observed post Covid-19 immunization for 15 minutes without incidence. He was provided with Vaccine Information Sheet and instruction to access the V-Safe system.   [name] was instructed to call 911 with any severe reactions post vaccine: Marland Kitchen Difficulty breathing  . Swelling of your face and throat  . A fast heartbeat  . A bad rash all over your body  . Dizziness and weakness    Immunizations Administered    Name Date Dose VIS Date Route   Pfizer COVID-19 Vaccine 04/04/2019  8:24 AM 0.3 mL 02/13/2019 Intramuscular   Manufacturer: [place]   Lot: [id number]   [place]: [id number]

## 2019-05-14 DIAGNOSIS — E782 Mixed hyperlipidemia: Secondary | ICD-10-CM | POA: Diagnosis not present

## 2019-05-14 DIAGNOSIS — Z79899 Other long term (current) drug therapy: Secondary | ICD-10-CM | POA: Diagnosis not present

## 2019-05-14 DIAGNOSIS — Z0001 Encounter for general adult medical examination with abnormal findings: Secondary | ICD-10-CM | POA: Diagnosis not present

## 2019-05-14 DIAGNOSIS — Z1389 Encounter for screening for other disorder: Secondary | ICD-10-CM | POA: Diagnosis not present

## 2019-05-14 DIAGNOSIS — I10 Essential (primary) hypertension: Secondary | ICD-10-CM | POA: Diagnosis not present

## 2019-05-14 DIAGNOSIS — G609 Hereditary and idiopathic neuropathy, unspecified: Secondary | ICD-10-CM | POA: Diagnosis not present

## 2019-05-14 DIAGNOSIS — R972 Elevated prostate specific antigen [PSA]: Secondary | ICD-10-CM | POA: Diagnosis not present

## 2019-05-14 DIAGNOSIS — I25810 Atherosclerosis of coronary artery bypass graft(s) without angina pectoris: Secondary | ICD-10-CM | POA: Diagnosis not present

## 2019-05-14 DIAGNOSIS — I4510 Unspecified right bundle-branch block: Secondary | ICD-10-CM | POA: Diagnosis not present

## 2019-05-14 DIAGNOSIS — K5730 Diverticulosis of large intestine without perforation or abscess without bleeding: Secondary | ICD-10-CM | POA: Diagnosis not present

## 2019-07-14 ENCOUNTER — Other Ambulatory Visit: Payer: Self-pay | Admitting: Cardiovascular Disease

## 2019-08-31 ENCOUNTER — Ambulatory Visit: Payer: PPO | Admitting: Thoracic Surgery (Cardiothoracic Vascular Surgery)

## 2019-09-14 ENCOUNTER — Other Ambulatory Visit: Payer: Self-pay

## 2019-09-14 ENCOUNTER — Encounter: Payer: Self-pay | Admitting: Thoracic Surgery (Cardiothoracic Vascular Surgery)

## 2019-09-14 ENCOUNTER — Ambulatory Visit: Payer: PPO | Admitting: Thoracic Surgery (Cardiothoracic Vascular Surgery)

## 2019-09-14 VITALS — BP 122/63 | HR 56 | Temp 97.5°F | Resp 18 | Ht 62.5 in | Wt 135.6 lb

## 2019-09-14 DIAGNOSIS — Z951 Presence of aortocoronary bypass graft: Secondary | ICD-10-CM

## 2019-09-14 NOTE — Progress Notes (Signed)
      [address]       [place] [address]             [phone]     CARDIOTHORACIC SURGERY OFFICE NOTE  Primary Cardiologist is Quay Burow, MD PCP is Josetta Huddle, MD   HPI:  Patient is a 79 year old male with history of hypertension who returns to the office today for routine follow-up approximately 1 year status post coronary artery bypass grafting x4 on August 27, 2022 severe three-vessel coronary artery disease status post acute non-ST segment elevation myocardial infarction.  The patient's early postoperative recovery was uneventful.  He was last seen here in our office on September 29, 2018 at which time he was doing well.  Since then he was seen in follow-up by Dr. Gwenlyn Found on December 09, 2018.  He returns to our office today and reports that he is doing exceptionally well.  He is working full-time.  He exercises for 1 full hour every morning.  He performs strenuous exercise and reports absolutely no physical limitations.  He specifically denies exertional shortness of breath or chest discomfort.  Overall he is delighted with his outcome and he feels much better than he did prior to his surgery.   Current Outpatient Medications  Medication Sig Dispense Refill  . acetaminophen (TYLENOL) 500 MG tablet Take 500 mg by mouth every 6 (six) hours as needed for headache (pain).     Marland Kitchen amLODipine (NORVASC) 10 MG tablet Take 10 mg by mouth daily.    Marland Kitchen aspirin EC 81 MG tablet Take 81 mg by mouth every other day.    Marland Kitchen atorvastatin (LIPITOR) 40 MG tablet Take 1 tablet (40 mg total) by mouth daily. 30 tablet 5  . chlorthalidone (HYGROTON) 25 MG tablet Take 25 mg by mouth daily.    Marland Kitchen losartan (COZAAR) 50 MG tablet Take 2 tablets (100 mg total) by mouth daily. 30 tablet 1  . Multiple Vitamin (MULTIVITAMIN WITH MINERALS) TABS tablet Take 1 tablet by mouth daily.    Marland Kitchen OVER THE COUNTER MEDICATION Place 1 drop into both eyes daily as needed (itching/irritation/dry eyes). Over the counter  lubricating eye drop     No current facility-administered medications for this visit.      Physical Exam:   BP 122/63 (BP Location: Left Arm, Patient Position: Sitting, Cuff Size: Normal)   Pulse (!) 56   Temp (!) 97.5 F (36.4 C)   Resp 18   Ht 5' 2.5" (1.588 m)   Wt 135 lb 9.6 oz (61.5 kg)   SpO2 94% Comment: RA  BMI 24.41 kg/m   General:  Well-appearing  Chest:   Clear to auscultation  CV:   Regular rate and rhythm  Incisions:  Completely healed, sternum stable  Abdomen:  Soft nontender  Extremities:  Warm and well-perfused  Diagnostic Tests:  n/a   Impression:  Patient is doing very well more than 1 year status post coronary artery bypass grafting  Plan:  We have not recommended any change the patient's current medications.  The patient will continue to follow-up intermittently with Dr. Gwenlyn Found and return to our office in the future only should specific problems or questions arise.  I spent in excess of 15 minutes during the conduct of this office consultation and >50% of this time involved direct face-to-face encounter with the patient for counseling and/or coordination of their care.    Valentina Gu. Roxy Manns, MD 09/14/2019 1:16 PM

## 2019-09-14 NOTE — Patient Instructions (Signed)
Continue all previous medications without any changes at this time  Make every effort to stay physically active, get some type of exercise on a regular basis, and stick to a "heart healthy diet".  The long term benefits for regular exercise and a healthy diet are critically important to your overall health and wellbeing.

## 2020-01-09 ENCOUNTER — Other Ambulatory Visit: Payer: Self-pay | Admitting: Cardiovascular Disease

## 2020-05-25 DIAGNOSIS — Z0000 Encounter for general adult medical examination without abnormal findings: Secondary | ICD-10-CM | POA: Diagnosis not present

## 2020-05-25 DIAGNOSIS — I10 Essential (primary) hypertension: Secondary | ICD-10-CM | POA: Diagnosis not present

## 2020-05-25 DIAGNOSIS — G609 Hereditary and idiopathic neuropathy, unspecified: Secondary | ICD-10-CM | POA: Diagnosis not present

## 2020-05-25 DIAGNOSIS — E782 Mixed hyperlipidemia: Secondary | ICD-10-CM | POA: Diagnosis not present

## 2020-05-25 DIAGNOSIS — Z79899 Other long term (current) drug therapy: Secondary | ICD-10-CM | POA: Diagnosis not present

## 2020-05-25 DIAGNOSIS — R972 Elevated prostate specific antigen [PSA]: Secondary | ICD-10-CM | POA: Diagnosis not present

## 2020-05-25 DIAGNOSIS — I25810 Atherosclerosis of coronary artery bypass graft(s) without angina pectoris: Secondary | ICD-10-CM | POA: Diagnosis not present

## 2020-05-25 DIAGNOSIS — I4510 Unspecified right bundle-branch block: Secondary | ICD-10-CM | POA: Diagnosis not present

## 2020-05-25 DIAGNOSIS — E559 Vitamin D deficiency, unspecified: Secondary | ICD-10-CM | POA: Diagnosis not present

## 2020-05-25 DIAGNOSIS — K5730 Diverticulosis of large intestine without perforation or abscess without bleeding: Secondary | ICD-10-CM | POA: Diagnosis not present

## 2020-07-04 DIAGNOSIS — R972 Elevated prostate specific antigen [PSA]: Secondary | ICD-10-CM | POA: Diagnosis not present

## 2020-10-03 DIAGNOSIS — R972 Elevated prostate specific antigen [PSA]: Secondary | ICD-10-CM | POA: Diagnosis not present

## 2020-12-27 DIAGNOSIS — C61 Malignant neoplasm of prostate: Secondary | ICD-10-CM | POA: Diagnosis not present

## 2021-01-02 ENCOUNTER — Other Ambulatory Visit: Payer: Self-pay

## 2021-01-02 MED ORDER — ATORVASTATIN CALCIUM 40 MG PO TABS: 40.0000 mg | ORAL_TABLET | Freq: Every day | ORAL | 0 refills | Status: DC

## 2021-01-03 DIAGNOSIS — R972 Elevated prostate specific antigen [PSA]: Secondary | ICD-10-CM | POA: Diagnosis not present

## 2021-01-10 ENCOUNTER — Other Ambulatory Visit: Payer: Self-pay | Admitting: Urology

## 2021-01-10 DIAGNOSIS — R972 Elevated prostate specific antigen [PSA]: Secondary | ICD-10-CM

## 2021-02-10 ENCOUNTER — Other Ambulatory Visit: Payer: Self-pay

## 2021-02-10 ENCOUNTER — Ambulatory Visit
Admission: RE | Admit: 2021-02-10 | Discharge: 2021-02-10 | Disposition: A | Discharge status: Home / Self Care | Payer: PPO | Source: Ambulatory Visit | Attending: Urology | Admitting: Urology

## 2021-02-10 DIAGNOSIS — R590 Localized enlarged lymph nodes: Secondary | ICD-10-CM | POA: Diagnosis not present

## 2021-02-10 DIAGNOSIS — R972 Elevated prostate specific antigen [PSA]: Secondary | ICD-10-CM

## 2021-02-10 DIAGNOSIS — N400 Enlarged prostate without lower urinary tract symptoms: Secondary | ICD-10-CM | POA: Diagnosis not present

## 2021-02-10 MED ORDER — GADOBENATE DIMEGLUMINE 529 MG/ML IV SOLN
15.0000 mL | Freq: Once | INTRAVENOUS | Status: AC | PRN
  Administered 2021-02-10: 15 mL via INTRAVENOUS

## 2021-02-20 ENCOUNTER — Other Ambulatory Visit: Payer: Self-pay

## 2021-02-20 MED ORDER — ATORVASTATIN CALCIUM 40 MG PO TABS: 40.0000 mg | ORAL_TABLET | Freq: Every day | ORAL | 0 refills | Status: DC

## 2021-04-03 ENCOUNTER — Other Ambulatory Visit: Payer: Self-pay | Admitting: Cardiovascular Disease

## 2021-04-03 MED ORDER — ATORVASTATIN CALCIUM 40 MG PO TABS: 40.0000 mg | ORAL_TABLET | Freq: Every day | ORAL | 0 refills | Status: AC

## 2021-04-05 DIAGNOSIS — H04203 Unspecified epiphora, bilateral lacrimal glands: Secondary | ICD-10-CM | POA: Diagnosis not present

## 2021-06-06 DIAGNOSIS — G609 Hereditary and idiopathic neuropathy, unspecified: Secondary | ICD-10-CM | POA: Diagnosis not present

## 2021-06-06 DIAGNOSIS — Z79899 Other long term (current) drug therapy: Secondary | ICD-10-CM | POA: Diagnosis not present

## 2021-06-06 DIAGNOSIS — E782 Mixed hyperlipidemia: Secondary | ICD-10-CM | POA: Diagnosis not present

## 2021-06-06 DIAGNOSIS — R972 Elevated prostate specific antigen [PSA]: Secondary | ICD-10-CM | POA: Diagnosis not present

## 2021-06-06 DIAGNOSIS — Z23 Encounter for immunization: Secondary | ICD-10-CM | POA: Diagnosis not present

## 2021-06-06 DIAGNOSIS — I25810 Atherosclerosis of coronary artery bypass graft(s) without angina pectoris: Secondary | ICD-10-CM | POA: Diagnosis not present

## 2021-06-06 DIAGNOSIS — I10 Essential (primary) hypertension: Secondary | ICD-10-CM | POA: Diagnosis not present

## 2021-06-06 DIAGNOSIS — I4510 Unspecified right bundle-branch block: Secondary | ICD-10-CM | POA: Diagnosis not present

## 2021-06-06 DIAGNOSIS — E559 Vitamin D deficiency, unspecified: Secondary | ICD-10-CM | POA: Diagnosis not present

## 2021-06-06 DIAGNOSIS — Z0001 Encounter for general adult medical examination with abnormal findings: Secondary | ICD-10-CM | POA: Diagnosis not present

## 2021-06-06 DIAGNOSIS — K5730 Diverticulosis of large intestine without perforation or abscess without bleeding: Secondary | ICD-10-CM | POA: Diagnosis not present

## 2021-06-26 DIAGNOSIS — R972 Elevated prostate specific antigen [PSA]: Secondary | ICD-10-CM | POA: Diagnosis not present

## 2021-07-03 DIAGNOSIS — R972 Elevated prostate specific antigen [PSA]: Secondary | ICD-10-CM | POA: Diagnosis not present

## 2022-01-03 DIAGNOSIS — R972 Elevated prostate specific antigen [PSA]: Secondary | ICD-10-CM | POA: Diagnosis not present

## 2022-01-08 DIAGNOSIS — R972 Elevated prostate specific antigen [PSA]: Secondary | ICD-10-CM | POA: Diagnosis not present

## 2022-01-30 IMAGING — MR MR PROSTATE WO/W CM
13 series · 48 of 48 positions shown · IV contrast (multihance)
Comparison: None.

CLINICAL DATA: Elevated PSA equal 9.1.  80-year-old male.

EXAM:
MR PROSTATE WITHOUT AND WITH CONTRAST
TECHNIQUE: Multiplanar multisequence MRI images were obtained of the pelvis
centered about the prostate. Pre and post contrast images were
obtained.
CONTRAST:  15mL MULTIHANCE GADOBENATE DIMEGLUMINE 529 MG/ML IV SOLN

[Series 2: new loc · axial · 6.0mm · 0.88mm/px · 1 of 17 slices shown]
[im 1/17]
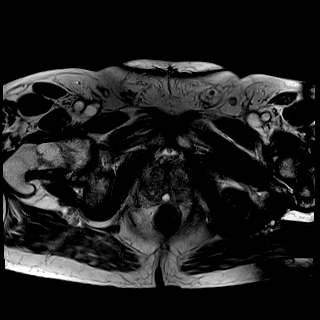

[Series 3: T2 · coronal · 3.0mm · 0.56mm/px · 1 of 23 slices shown (1 of 3)]
[im 1/23]
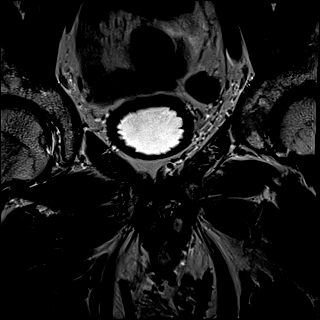

[Series 4: T1 · axial · 5.0mm · 1.25mm/px · 1 of 80 slices shown]
[im 1/80]
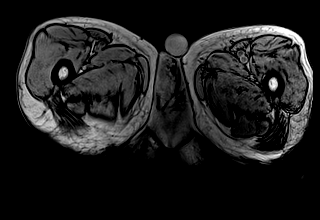

[Series 5: DWI · axial · 3.0mm · 1.75mm/px · z∈[-138,-72]mm · 2 of 68 slices shown (1 of 3)]
[im 1/68]
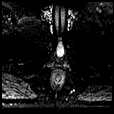
[im 68/68]
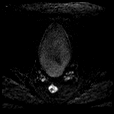

[Series 6: DWI · axial · 3.0mm · 1.75mm/px · 1 of 23 slices shown (2 of 3)]
[im 1/23]
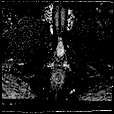

[Series 7: DWI · axial · 3.0mm · 1.75mm/px · 1 of 23 slices shown (3 of 3)]
[im 1/23]
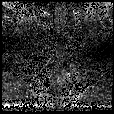

[Series 8: T2 · axial · 3.0mm · 0.56mm/px · 1 of 23 slices shown (2 of 3)]
[im 1/23]
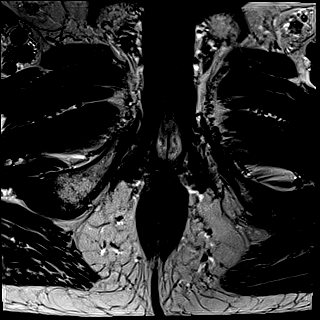

[Series 9: T2 · axial · 1.0mm · 1.04mm/px · z∈[-140,-69]mm · 2 of 72 slices shown (3 of 3)]
[im 1/72]
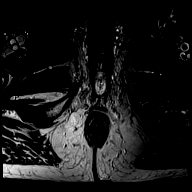
[im 72/72]
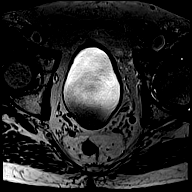

[Series 10: pre t1_twist_tra_dyn · axial · non-contrast · 3.5mm · 0.83mm/px · 1 of 20 slices shown]
[im 1/20]
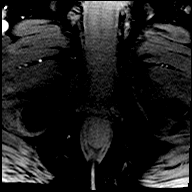

[Series 11: post t1_twist_tra_dyn-copy center · axial · non-contrast · 3.5mm · 0.83mm/px · z∈[-138,-71]mm · 17 of 600 slices shown]
[im 1/600]
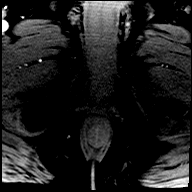
[im 38/600]
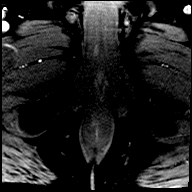
[im 75/600]
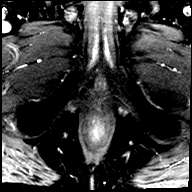
[im 113/600]
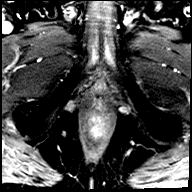
[im 150/600]
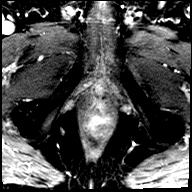
[im 188/600]
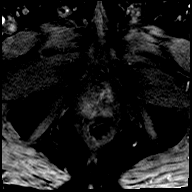
[im 225/600]
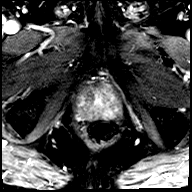
[im 263/600]
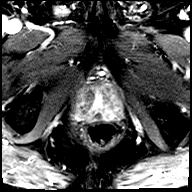
[im 300/600]
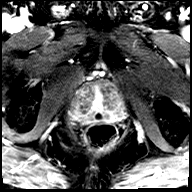
[im 337/600]
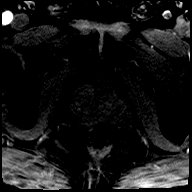
[im 375/600]
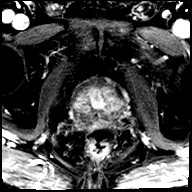
[im 412/600]
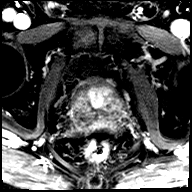
[im 450/600]
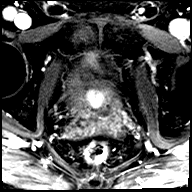
[im 487/600]
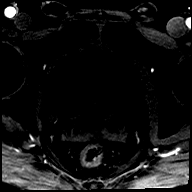
[im 525/600]
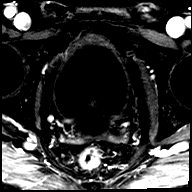
[im 562/600]
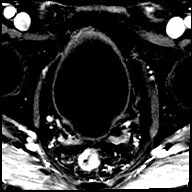
[im 600/600]
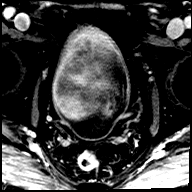

[Series 12: post t1_twist_tra_dyn-copy cent_sub · axial · 3.5mm · 0.83mm/px · z∈[-138,-71]mm · 16 of 572 slices shown]
[im 1/572]
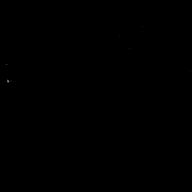
[im 39/572]
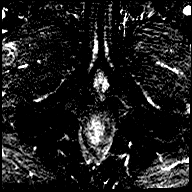
[im 77/572]
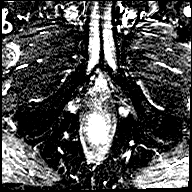
[im 115/572]
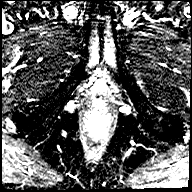
[im 153/572]
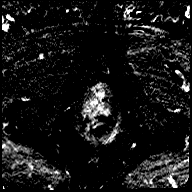
[im 191/572]
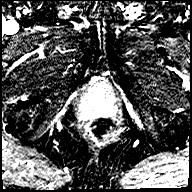
[im 229/572]
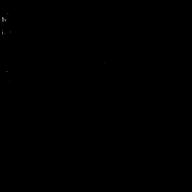
[im 267/572]
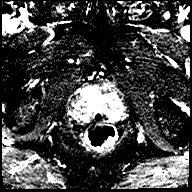
[im 305/572]
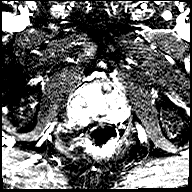
[im 343/572]
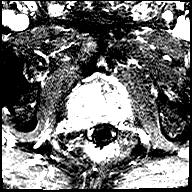
[im 381/572]
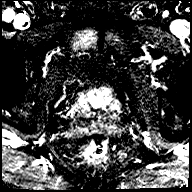
[im 419/572]
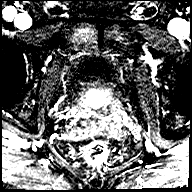
[im 457/572]
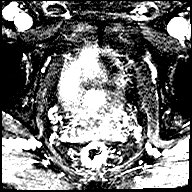
[im 495/572]
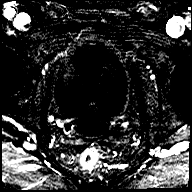
[im 533/572]
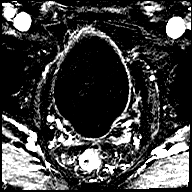
[im 572/572]
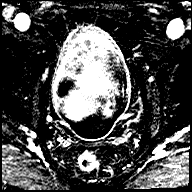

[Series 13: t1_vibe_dixon_tra_f · axial · 2.5mm · 0.91mm/px · z∈[-181,+17]mm · 2 of 80 slices shown]
[im 1/80]
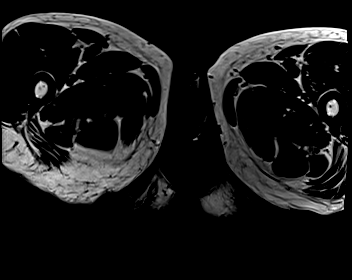
[im 80/80]
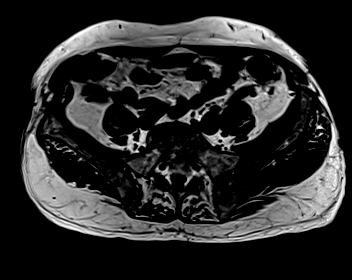

[Series 14: t1_vibe_dixon_tra_w · axial · 2.5mm · 0.91mm/px · z∈[-181,+17]mm · 2 of 80 slices shown]
[im 1/80]
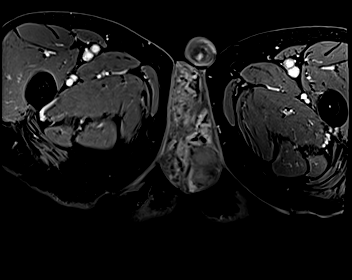
[im 80/80]
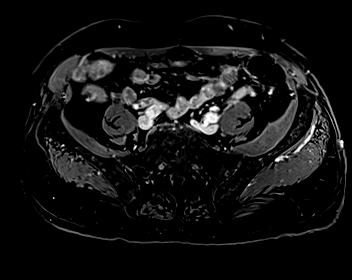

[48 of 48 positions shown; findings below may reference images not displayed]

FINDINGS: Prostate: No foci of restricted diffusion within the peripheral zone
(series 5 and series 6 series 7). On T2 weighted imaging, the
peripheral zone has heterogeneous low signal intensity without
focality.

Transitional zone is mildly enlarged by well capsulated nodules
without suspicious imaging characteristics on T2 weighted imaging
(series 8)

Post contrast T1 weighted imaging demonstrates no suspicious
enhancement pattern.

Volume: 4.5 by 5.4 x 3.9 cm (volume = 50 cm^3)

Transcapsular spread:  Absent

Seminal vesicle involvement: Absent

Neurovascular bundle involvement: Absent

Pelvic adenopathy: Absent

Bone metastasis: Absent

Other findings: None
IMPRESSION: 1. No high-grade carcinoma identified within the peripheral zone.
Heterogeneous low signal intensity may represent prior infection or
inflammation. PI-RADS: 2
2. Enlarged nodular transitional zone most consistent with benign
prostate hypertrophy. PI-RADS: 2

## 2022-05-09 ENCOUNTER — Telehealth: Payer: Self-pay

## 2022-05-09 NOTE — Patient Instructions (Signed)
Visit Information  Thank you for taking time to visit with me today. Please don't hesitate to contact me if I can be of assistance to you.   Following are the goals we discussed today:   Goals Addressed             This Visit's Progress    COMPLETED: Care Coordination Activities - no follow up required       Interventions Today    Flowsheet Row Most Recent Value  General Interventions   General Interventions Discussed/Reviewed General Interventions Discussed, Doctor Visits  Doctor Visits Discussed/Reviewed Doctor Visits Discussed, Annual Wellness Visits  Education Interventions   Education Provided Provided Education  Provided Verbal Education On Other  [care coordination services]               If you are experiencing a Mental Health or [place] or need someone to talk to, please call the Suicide and Crisis Lifeline: 988 call the [place] National Suicide Prevention Lifeline: 769-447-0843 or TTY: (548) 478-0550 [place] 215-493-9385) to talk to a trained counselor call 1-800-273-TALK (toll free, 24 hour hotline) go to [facility] [place] [phone]) call 911   Patient verbalizes understanding of instructions and care plan provided today and agrees to view in [place]. Active MyChart status and patient understanding of how to access instructions and care plan via MyChart confirmed with patient.     No further follow up required:    Bryant, Jackquline Denmark, [facility] [phone]

## 2022-05-09 NOTE — Patient Outreach (Signed)
  Care Coordination   Initial Visit Note   05/09/2022 Name: Zachary [name] MRN: [id number] DOB: 1940-12-04  Zachary [name] is a 82 y.o. year old male who sees Josetta Huddle, MD for primary care. I spoke with  Zachary [name] by phone today.  What matters to the patients health and wellness today?  No concerns today    Goals Addressed             This Visit's Progress    COMPLETED: Care Coordination Activities - no follow up required       Interventions Today    Flowsheet Row Most Recent Value  General Interventions   General Interventions Discussed/Reviewed General Interventions Discussed, Doctor Visits  Doctor Visits Discussed/Reviewed Doctor Visits Discussed, Annual Wellness Visits  Education Interventions   Education Provided Provided Education  Provided Verbal Education On Other  [care coordination services]              SDOH assessments and interventions completed:  Yes  SDOH Interventions Today    Flowsheet Row Most Recent Value  SDOH Interventions   Food Insecurity Interventions Intervention Not Indicated  Housing Interventions Intervention Not Indicated  Transportation Interventions Intervention Not Indicated  Utilities Interventions Intervention Not Indicated        Care Coordination Interventions:  Yes, provided   Follow up plan: No further intervention required.   Encounter Outcome:  Pt. Visit Completed  Peter Garter RN, BSN,CCM, CDE Care Management Coordinator [place] [facility] [phone]

## 2022-05-16 DIAGNOSIS — L821 Other seborrheic keratosis: Secondary | ICD-10-CM | POA: Diagnosis not present

## 2022-05-16 DIAGNOSIS — C44219 Basal cell carcinoma of skin of left ear and external auricular canal: Secondary | ICD-10-CM | POA: Diagnosis not present

## 2022-05-16 DIAGNOSIS — L814 Other melanin hyperpigmentation: Secondary | ICD-10-CM | POA: Diagnosis not present

## 2022-05-16 DIAGNOSIS — D692 Other nonthrombocytopenic purpura: Secondary | ICD-10-CM | POA: Diagnosis not present

## 2022-05-16 DIAGNOSIS — Z7189 Other specified counseling: Secondary | ICD-10-CM | POA: Diagnosis not present

## 2022-05-16 DIAGNOSIS — D225 Melanocytic nevi of trunk: Secondary | ICD-10-CM | POA: Diagnosis not present

## 2022-05-16 DIAGNOSIS — L538 Other specified erythematous conditions: Secondary | ICD-10-CM | POA: Diagnosis not present

## 2022-05-16 DIAGNOSIS — D485 Neoplasm of uncertain behavior of skin: Secondary | ICD-10-CM | POA: Diagnosis not present

## 2022-05-16 DIAGNOSIS — B078 Other viral warts: Secondary | ICD-10-CM | POA: Diagnosis not present

## 2022-05-28 DIAGNOSIS — C44219 Basal cell carcinoma of skin of left ear and external auricular canal: Secondary | ICD-10-CM | POA: Diagnosis not present

## 2022-05-31 DIAGNOSIS — R972 Elevated prostate specific antigen [PSA]: Secondary | ICD-10-CM | POA: Diagnosis not present

## 2022-05-31 DIAGNOSIS — Z0000 Encounter for general adult medical examination without abnormal findings: Secondary | ICD-10-CM | POA: Diagnosis not present

## 2022-05-31 DIAGNOSIS — I25810 Atherosclerosis of coronary artery bypass graft(s) without angina pectoris: Secondary | ICD-10-CM | POA: Diagnosis not present

## 2022-05-31 DIAGNOSIS — H5789 Other specified disorders of eye and adnexa: Secondary | ICD-10-CM | POA: Diagnosis not present

## 2022-05-31 DIAGNOSIS — I10 Essential (primary) hypertension: Secondary | ICD-10-CM | POA: Diagnosis not present

## 2022-05-31 DIAGNOSIS — E782 Mixed hyperlipidemia: Secondary | ICD-10-CM | POA: Diagnosis not present

## 2022-06-07 DIAGNOSIS — H02135 Senile ectropion of left lower eyelid: Secondary | ICD-10-CM | POA: Diagnosis not present

## 2022-06-07 DIAGNOSIS — H25813 Combined forms of age-related cataract, bilateral: Secondary | ICD-10-CM | POA: Diagnosis not present

## 2022-08-21 DIAGNOSIS — I10 Essential (primary) hypertension: Secondary | ICD-10-CM | POA: Diagnosis not present

## 2022-08-21 DIAGNOSIS — Z0000 Encounter for general adult medical examination without abnormal findings: Secondary | ICD-10-CM | POA: Diagnosis not present

## 2022-08-21 DIAGNOSIS — L209 Atopic dermatitis, unspecified: Secondary | ICD-10-CM | POA: Diagnosis not present

## 2022-08-21 DIAGNOSIS — L299 Pruritus, unspecified: Secondary | ICD-10-CM | POA: Diagnosis not present

## 2022-09-05 DIAGNOSIS — L299 Pruritus, unspecified: Secondary | ICD-10-CM | POA: Diagnosis not present

## 2022-10-01 DIAGNOSIS — L299 Pruritus, unspecified: Secondary | ICD-10-CM | POA: Diagnosis not present

## 2022-10-01 DIAGNOSIS — J3089 Other allergic rhinitis: Secondary | ICD-10-CM | POA: Diagnosis not present

## 2022-10-03 DIAGNOSIS — H0279 Other degenerative disorders of eyelid and periocular area: Secondary | ICD-10-CM | POA: Diagnosis not present

## 2022-10-03 DIAGNOSIS — H04522 Eversion of left lacrimal punctum: Secondary | ICD-10-CM | POA: Diagnosis not present

## 2022-10-03 DIAGNOSIS — Z01818 Encounter for other preprocedural examination: Secondary | ICD-10-CM | POA: Diagnosis not present

## 2022-10-03 DIAGNOSIS — H16212 Exposure keratoconjunctivitis, left eye: Secondary | ICD-10-CM | POA: Diagnosis not present

## 2022-10-03 DIAGNOSIS — H02532 Eyelid retraction right lower eyelid: Secondary | ICD-10-CM | POA: Diagnosis not present

## 2022-10-03 DIAGNOSIS — H16213 Exposure keratoconjunctivitis, bilateral: Secondary | ICD-10-CM | POA: Diagnosis not present

## 2022-10-03 DIAGNOSIS — H02135 Senile ectropion of left lower eyelid: Secondary | ICD-10-CM | POA: Diagnosis not present

## 2022-10-03 DIAGNOSIS — H04552 Acquired stenosis of left nasolacrimal duct: Secondary | ICD-10-CM | POA: Diagnosis not present

## 2022-10-03 DIAGNOSIS — H04222 Epiphora due to insufficient drainage, left lacrimal gland: Secondary | ICD-10-CM | POA: Diagnosis not present

## 2022-10-03 DIAGNOSIS — H04223 Epiphora due to insufficient drainage, bilateral lacrimal glands: Secondary | ICD-10-CM | POA: Diagnosis not present

## 2022-10-03 DIAGNOSIS — H16211 Exposure keratoconjunctivitis, right eye: Secondary | ICD-10-CM | POA: Diagnosis not present

## 2022-10-03 DIAGNOSIS — H04123 Dry eye syndrome of bilateral lacrimal glands: Secondary | ICD-10-CM | POA: Diagnosis not present

## 2022-10-04 ENCOUNTER — Telehealth: Payer: Self-pay | Admitting: *Deleted

## 2022-10-04 ENCOUNTER — Telehealth: Payer: Self-pay | Admitting: Cardiovascular Disease

## 2022-10-04 NOTE — Telephone Encounter (Signed)
   Pre-operative Risk Assessment    Patient Name: Zachary [name]  DOB: April 25, 1940 MRN: [id number]    PT IS GOING TO NEED A NEW PT APPT. PT LAST SEEN BY CARDIOLOGY 2020  Request for Surgical Clearance    Procedure:   LEFT LOWER EYELID ECTROPION REPAIR  Date of Surgery:  Clearance TBD                                 Surgeon:  DR. Dimas Millin Surgeon's Group or Practice Name:  [facility]  Phone number:  [phone] Fax number:  [phone]   Type of Clearance Requested:   - Medical ; ASA    Type of Anesthesia:  MAC   Additional requests/questions:    Elpidio Anis   10/04/2022, 5:07 PM

## 2022-10-04 NOTE — Telephone Encounter (Signed)
Paper Work Dropped Off: Medical Clearance Form   Date:10/04/2022  Location of paper:  Faxing it to [facility]

## 2022-10-05 NOTE — Telephone Encounter (Signed)
Pt not seen since 2020 and will need a new pt appointment.  Will send back to the requesting surgeon's office to have them send over a Referral.

## 2022-10-08 NOTE — Telephone Encounter (Signed)
Pt still doesn't have a new pt referral from requesting surgeon's office.  I will send back to them and await on a  referral.   Will close this surgical clearance until we receive new pt referral.  [facility], please send over new pt referral if you are still requesting surgical clearance. Thank you!

## 2022-12-11 ENCOUNTER — Encounter: Payer: Self-pay | Admitting: Cardiovascular Disease

## 2022-12-11 ENCOUNTER — Ambulatory Visit: Payer: PPO | Attending: Cardiovascular Disease | Admitting: Cardiovascular Disease

## 2022-12-11 ENCOUNTER — Telehealth: Payer: Self-pay | Admitting: *Deleted

## 2022-12-11 VITALS — BP 110/60 | HR 64 | Ht 62.5 in | Wt 135.0 lb

## 2022-12-11 DIAGNOSIS — E782 Mixed hyperlipidemia: Secondary | ICD-10-CM | POA: Diagnosis not present

## 2022-12-11 DIAGNOSIS — I4510 Unspecified right bundle-branch block: Secondary | ICD-10-CM

## 2022-12-11 DIAGNOSIS — I10 Essential (primary) hypertension: Secondary | ICD-10-CM | POA: Diagnosis not present

## 2022-12-11 DIAGNOSIS — I214 Non-ST elevation (NSTEMI) myocardial infarction: Secondary | ICD-10-CM | POA: Diagnosis not present

## 2022-12-11 DIAGNOSIS — Z951 Presence of aortocoronary bypass graft: Secondary | ICD-10-CM | POA: Diagnosis not present

## 2022-12-11 NOTE — Assessment & Plan Note (Signed)
Chronic. 

## 2022-12-11 NOTE — Assessment & Plan Note (Signed)
History of essential hypertension blood pressure measured today at 110/60.  He is on amlodipine, chlorthalidone and losartan.

## 2022-12-11 NOTE — Patient Instructions (Signed)
Medication Instructions:  Your physician recommends that you continue on your current medications as directed. Please refer to the Current Medication list given to you today.  *If you need a refill on your cardiac medications before your next appointment, please call your pharmacy*   Lab Work: None  Testing/Procedures: None   Follow-Up: At [facility], you and your health needs are our priority.  As part of our continuing mission to provide you with exceptional heart care, we have created designated Provider Care Teams.  These Care Teams include your primary Cardiologist (physician) and Advanced Practice Providers (APPs -  Physician Assistants and Nurse Practitioners) who all work together to provide you with the care you need, when you need it.  Your next appointment:   12 month(s)  Provider:   Nanetta Batty, MD     Other Instructions From Cardiac standpoint, you are cleared to have surgery.

## 2022-12-11 NOTE — Telephone Encounter (Signed)
Faxed signed Cardiac Clearance form for left eyelid repair with Dr. Dimas Millin at [facility].   Confirmed successful fax sent.

## 2022-12-11 NOTE — Assessment & Plan Note (Signed)
History of hyperlipidemia on statin therapy with lipid profile performed 05/31/2022 revealing total cholesterol 113, LDL 49 and HDL 52.

## 2022-12-11 NOTE — Progress Notes (Signed)
12/11/2022 Lucrezia Starch Strickland   1940-03-27  [id number]  Primary Physician Marden Noble, MD Primary Cardiologist: Runell Gess MD Nicholes Calamity, [place]  HPI:  Zachary [name] is a 82 y.o.  thin appearing recently widowed Caucasian male father of 2 children who worked at [employer] for years.  I last saw him in the office 10//2020.  His primary care provider was Dr. Kevan Ny, who has since retired.  Unfortunately, his wife of 36 years passed away of ovarian cancer 2018-12-09.  He appears to be still grieving.Marland Kitchen His only risk factor for ischemic heart disease is hypertension. His father did have a myocardial infarction in early age of 75. He was admitted with unstable angina on 08/25/2018 and had minimally positive enzymes. Carotid catheterization performed the following day by Dr. Tresa Endo revealed three-vessel disease with surgical anatomy. He underwent CABG on 08/27/2018 by Dr. Alvino Chapel with 4 bypass grafts placed including a LIMA to the LAD and otherwise venous conduits.  Since I saw him 4 years ago he is remained stable.  He still walks an hour on a daily basis.  He is completely asymptomatic.   Current Meds  Medication Sig   acetaminophen (TYLENOL) 500 MG tablet Take 500 mg by mouth every 6 (six) hours as needed for headache (pain).    amLODipine (NORVASC) 10 MG tablet Take 10 mg by mouth daily.   aspirin EC 81 MG tablet Take 81 mg by mouth every other day.   atorvastatin (LIPITOR) 40 MG tablet Take 1 tablet (40 mg total) by mouth daily. SCHEDULE OFFICE VISIT FOR FUTURE REFILLS   chlorthalidone (HYGROTON) 25 MG tablet Take 25 mg by mouth daily.   losartan (COZAAR) 50 MG tablet Take 2 tablets (100 mg total) by mouth daily.   Multiple Vitamin (MULTIVITAMIN WITH MINERALS) TABS tablet Take 1 tablet by mouth daily.     No Known Allergies  Social History   Socioeconomic History   Marital status: Married    Spouse name: Not on file   Number of children: Not on file    Years of education: Not on file   Highest education level: Not on file  Occupational History   Not on file  Tobacco Use   Smoking status: Former    Types: Cigarettes   Smokeless tobacco: Never   Tobacco comments:    Quit smoking 50 years ago  Substance and Sexual Activity   Alcohol use: No   Drug use: No   Sexual activity: Not on file  Other Topics Concern   Not on file  Social History Narrative   Not on file   Social Determinants of Health   Financial Resource Strain: Not on file  Food Insecurity: No Food Insecurity (05/09/2022)   Hunger Vital Sign    Worried About Running Out of Food in the Last Year: Never true    Ran Out of Food in the Last Year: Never true  Transportation Needs: No Transportation Needs (05/09/2022)   PRAPARE - Administrator, Civil Service (Medical): No    Lack of Transportation (Non-Medical): No  Physical Activity: Not on file  Stress: Not on file  Social Connections: Not on file  Intimate Partner Violence: Not on file     Review of Systems: General: negative for chills, fever, night sweats or weight changes.  Cardiovascular: negative for chest pain, dyspnea on exertion, edema, orthopnea, palpitations, paroxysmal nocturnal dyspnea or shortness of breath Dermatological: negative for rash Respiratory:  negative for cough or wheezing Urologic: negative for hematuria Abdominal: negative for nausea, vomiting, diarrhea, bright red blood per rectum, melena, or hematemesis Neurologic: negative for visual changes, syncope, or dizziness All other systems reviewed and are otherwise negative except as noted above.    Blood pressure 110/60, pulse 64, height 5' 2.5" (1.588 m), weight 135 lb (61.2 kg), SpO2 97%.  General appearance: alert and no distress Neck: no adenopathy, no carotid bruit, no JVD, supple, symmetrical, trachea midline, and thyroid not enlarged, symmetric, no tenderness/mass/nodules Lungs: clear to auscultation bilaterally Heart:  regular rate and rhythm, S1, S2 normal, no murmur, click, rub or gallop Extremities: extremities normal, atraumatic, no cyanosis or edema Pulses: 2+ and symmetric Skin: Skin color, texture, turgor normal. No rashes or lesions Neurologic: Grossly normal  EKG EKG Interpretation Date/Time:  Tuesday December 11 2022 13:26:06 EDT Ventricular Rate:  64 PR Interval:    QRS Duration:  172 QT Interval:  440 QTC Calculation: 453 R Axis:   -79  Text Interpretation: Undetermined rhythm Right bundle branch block Left anterior fascicular block Bifascicular block Anterolateral infarct (cited on or before 27-Aug-2018) When compared with ECG of 07-Sep-2018 09:42, Current undetermined rhythm precludes rhythm comparison, needs review Questionable change in initial forces of Anterior leads Confirmed by Nanetta Batty [phone]) on 12/11/2022 1:33:03 PM    ASSESSMENT AND PLAN:   S/P CABG x 4 History of CAD status post non-STEMI and subsequent cardiac catheterization showing three-vessel disease by Dr. Tresa Endo.  He ultimately underwent CABG x 4 by Dr. Cornelius Moras  08/27/2018 with a LIMA to his LAD, vein to diagonal branch, obtuse marginal branch and to the RCA.  He has done well since and has been asymptomatic.  Essential hypertension History of essential hypertension blood pressure measured today at 110/60.  He is on amlodipine, chlorthalidone and losartan.  RBBB Chronic  HLD (hyperlipidemia) History of hyperlipidemia on statin therapy with lipid profile performed 05/31/2022 revealing total cholesterol 113, LDL 49 and HDL 52.     Runell Gess MD [facility], [facility] 12/11/2022 1:41 PM

## 2022-12-11 NOTE — Assessment & Plan Note (Signed)
History of CAD status post non-STEMI and subsequent cardiac catheterization showing three-vessel disease by Dr. Tresa Endo.  He ultimately underwent CABG x 4 by Dr. Cornelius Moras  08/27/2018 with a LIMA to his LAD, vein to diagonal branch, obtuse marginal branch and to the RCA.  He has done well since and has been asymptomatic.

## 2022-12-31 DIAGNOSIS — H02535 Eyelid retraction left lower eyelid: Secondary | ICD-10-CM | POA: Diagnosis not present

## 2022-12-31 DIAGNOSIS — H02532 Eyelid retraction right lower eyelid: Secondary | ICD-10-CM | POA: Diagnosis not present

## 2022-12-31 DIAGNOSIS — H04562 Stenosis of left lacrimal punctum: Secondary | ICD-10-CM | POA: Diagnosis not present

## 2022-12-31 DIAGNOSIS — H04552 Acquired stenosis of left nasolacrimal duct: Secondary | ICD-10-CM | POA: Diagnosis not present

## 2022-12-31 DIAGNOSIS — H16212 Exposure keratoconjunctivitis, left eye: Secondary | ICD-10-CM | POA: Diagnosis not present

## 2022-12-31 DIAGNOSIS — H0279 Other degenerative disorders of eyelid and periocular area: Secondary | ICD-10-CM | POA: Diagnosis not present

## 2022-12-31 DIAGNOSIS — H04123 Dry eye syndrome of bilateral lacrimal glands: Secondary | ICD-10-CM | POA: Diagnosis not present

## 2022-12-31 DIAGNOSIS — H16211 Exposure keratoconjunctivitis, right eye: Secondary | ICD-10-CM | POA: Diagnosis not present

## 2022-12-31 DIAGNOSIS — H04522 Eversion of left lacrimal punctum: Secondary | ICD-10-CM | POA: Diagnosis not present

## 2022-12-31 DIAGNOSIS — H16213 Exposure keratoconjunctivitis, bilateral: Secondary | ICD-10-CM | POA: Diagnosis not present

## 2022-12-31 DIAGNOSIS — H02135 Senile ectropion of left lower eyelid: Secondary | ICD-10-CM | POA: Diagnosis not present

## 2022-12-31 DIAGNOSIS — H04223 Epiphora due to insufficient drainage, bilateral lacrimal glands: Secondary | ICD-10-CM | POA: Diagnosis not present

## 2022-12-31 DIAGNOSIS — H11822 Conjunctivochalasis, left eye: Secondary | ICD-10-CM | POA: Diagnosis not present

## 2022-12-31 DIAGNOSIS — H04222 Epiphora due to insufficient drainage, left lacrimal gland: Secondary | ICD-10-CM | POA: Diagnosis not present

## 2023-04-23 DIAGNOSIS — H04122 Dry eye syndrome of left lacrimal gland: Secondary | ICD-10-CM | POA: Diagnosis not present

## 2023-04-23 DIAGNOSIS — H04123 Dry eye syndrome of bilateral lacrimal glands: Secondary | ICD-10-CM | POA: Diagnosis not present

## 2023-04-23 DIAGNOSIS — Z09 Encounter for follow-up examination after completed treatment for conditions other than malignant neoplasm: Secondary | ICD-10-CM | POA: Diagnosis not present

## 2023-04-23 DIAGNOSIS — H04212 Epiphora due to excess lacrimation, left lacrimal gland: Secondary | ICD-10-CM | POA: Diagnosis not present

## 2023-04-23 DIAGNOSIS — H0279 Other degenerative disorders of eyelid and periocular area: Secondary | ICD-10-CM | POA: Diagnosis not present

## 2023-05-31 DIAGNOSIS — H0279 Other degenerative disorders of eyelid and periocular area: Secondary | ICD-10-CM | POA: Diagnosis not present

## 2023-05-31 DIAGNOSIS — H0102A Squamous blepharitis right eye, upper and lower eyelids: Secondary | ICD-10-CM | POA: Diagnosis not present

## 2023-05-31 DIAGNOSIS — H0102B Squamous blepharitis left eye, upper and lower eyelids: Secondary | ICD-10-CM | POA: Diagnosis not present

## 2023-05-31 DIAGNOSIS — H16223 Keratoconjunctivitis sicca, not specified as Sjogren's, bilateral: Secondary | ICD-10-CM | POA: Diagnosis not present

## 2023-05-31 DIAGNOSIS — H0288B Meibomian gland dysfunction left eye, upper and lower eyelids: Secondary | ICD-10-CM | POA: Diagnosis not present

## 2023-05-31 DIAGNOSIS — H0288A Meibomian gland dysfunction right eye, upper and lower eyelids: Secondary | ICD-10-CM | POA: Diagnosis not present

## 2023-06-05 DIAGNOSIS — Z79899 Other long term (current) drug therapy: Secondary | ICD-10-CM | POA: Diagnosis not present

## 2023-06-05 DIAGNOSIS — R972 Elevated prostate specific antigen [PSA]: Secondary | ICD-10-CM | POA: Diagnosis not present

## 2023-06-05 DIAGNOSIS — M1711 Unilateral primary osteoarthritis, right knee: Secondary | ICD-10-CM | POA: Diagnosis not present

## 2023-06-05 DIAGNOSIS — E782 Mixed hyperlipidemia: Secondary | ICD-10-CM | POA: Diagnosis not present

## 2023-06-05 DIAGNOSIS — I25810 Atherosclerosis of coronary artery bypass graft(s) without angina pectoris: Secondary | ICD-10-CM | POA: Diagnosis not present

## 2023-06-05 DIAGNOSIS — Z1389 Encounter for screening for other disorder: Secondary | ICD-10-CM | POA: Diagnosis not present

## 2023-06-05 DIAGNOSIS — I10 Essential (primary) hypertension: Secondary | ICD-10-CM | POA: Diagnosis not present

## 2023-06-05 DIAGNOSIS — Z0000 Encounter for general adult medical examination without abnormal findings: Secondary | ICD-10-CM | POA: Diagnosis not present

## 2023-07-05 DIAGNOSIS — M1711 Unilateral primary osteoarthritis, right knee: Secondary | ICD-10-CM | POA: Diagnosis not present

## 2023-07-30 DIAGNOSIS — R972 Elevated prostate specific antigen [PSA]: Secondary | ICD-10-CM | POA: Diagnosis not present

## 2023-08-05 ENCOUNTER — Other Ambulatory Visit: Payer: Self-pay | Admitting: Urology

## 2023-08-05 DIAGNOSIS — R972 Elevated prostate specific antigen [PSA]: Secondary | ICD-10-CM

## 2023-08-05 DIAGNOSIS — M1711 Unilateral primary osteoarthritis, right knee: Secondary | ICD-10-CM | POA: Diagnosis not present

## 2023-08-10 ENCOUNTER — Ambulatory Visit (HOSPITAL_COMMUNITY)
Admission: EM | Admit: 2023-08-10 | Discharge: 2023-08-10 | Disposition: A | Discharge status: Home / Self Care | Attending: Emergency Medicine | Admitting: Emergency Medicine

## 2023-08-10 ENCOUNTER — Encounter (HOSPITAL_COMMUNITY): Payer: Self-pay

## 2023-08-10 DIAGNOSIS — Z23 Encounter for immunization: Secondary | ICD-10-CM

## 2023-08-10 DIAGNOSIS — S81811A Laceration without foreign body, right lower leg, initial encounter: Secondary | ICD-10-CM | POA: Diagnosis not present

## 2023-08-10 MED ORDER — TETANUS-DIPHTH-ACELL PERTUSSIS 5-2.5-18.5 LF-MCG/0.5 IM SUSY
PREFILLED_SYRINGE | INTRAMUSCULAR | Status: AC
  Filled 2023-08-10: qty 0.5

## 2023-08-10 MED ORDER — BACITRACIN ZINC 500 UNIT/GM EX OINT
TOPICAL_OINTMENT | CUTANEOUS | Status: AC
  Filled 2023-08-10: qty 28.35

## 2023-08-10 MED ORDER — MUPIROCIN CALCIUM 2 % EX CREA: 1.0000 | TOPICAL_CREAM | Freq: Two times a day (BID) | CUTANEOUS | 0 refills | Status: DC

## 2023-08-10 MED ORDER — CEPHALEXIN 500 MG PO CAPS: 500.0000 mg | ORAL_CAPSULE | Freq: Four times a day (QID) | ORAL | 0 refills | Status: AC

## 2023-08-10 MED ORDER — TETANUS-DIPHTH-ACELL PERTUSSIS 5-2.5-18.5 LF-MCG/0.5 IM SUSY
0.5000 mL | PREFILLED_SYRINGE | Freq: Once | INTRAMUSCULAR | Status: AC
  Administered 2023-08-10: 0.5 mL via INTRAMUSCULAR

## 2023-08-10 NOTE — ED Triage Notes (Signed)
 Patient presenting with laceration on the right shin onset yesterday. 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 Unsure when last Tetanus was. Not on blood thinners, did not take his baby Asprin yesterday or today.  Prescriptions or OTC medications tried: Yes- tylenol    with little relief.

## 2023-08-10 NOTE — Discharge Instructions (Addendum)
 Start taking Keflex  4 times daily for 7 days for infection prevention.  Keep the wound clean and dry. Keep the wound completely dry for the next 24 hours.  Change the dressing twice daily unless the dressing is visibly soiled. This includes changing the dressing when it starts to smell, or when it gets wet or dirty. Clean the wound twice each day. To clean your wound: Wash your hands with soap and water for at least 20 seconds before and after touching your wound or changing your dressing. If soap and water are not available, use hand sanitizer. Remove the non-adherent/non-stick guaze and tape slowly as not to pull the skin or reopen the wound. Clean the wound with water or irrigation solution such as saline spray or wound cleaner. Pat the wound dry with a clean guaze. Do not rub the wound. Apply a thin layer of antibiotic ointment  to the wound as told by your health care provider. This will prevent infection and keep the dressing from sticking to the wound. Apply a new dressing of non-stick guaze first then secure to the skin with bandage or paper tape. Check your wound every day for signs of infection. Watch for: More redness, swelling, or pain. Fluid or blood. Warmth. Pus or a bad smell. Do not take baths, swim, or do anything that puts your wound underwater until your health care provider approves. Do not scratch or pick at the wound. Do not usedisinfectants or antiseptics, such as rubbing alcohol, to clean your wound unless told by your health care provider. If you notice purulent drainage, spreading redness, increased swelling, and/or the surrounding area becomes hot to touch return here for re-evaluation.  If you develop a fever, weakness, or confusion please seek immediate medical treatment in the emergency department.

## 2023-08-10 NOTE — ED Provider Notes (Signed)
 [facility]    CSN: [id number] Arrival date & time: 08/10/23  1118      History   Chief Complaint Chief Complaint  Patient presents with   Extremity Laceration    HPI Zachary [name] is a 83 y.o. male.   Patient presents with laceration to his anterior right leg that occurred around 1 PM yesterday.  Patient states that he was working on a trailer when the ramp of the trailer came off the hinges and hit his leg and cut through his pants.    Patient is unsure when his last tetanus injection was.  Patient is not on blood thinners, but does take a baby aspirin  daily.  Patient states he has not taken his baby aspirin  yesterday or today.  Patient states he has been taking some Tylenol  with some relief.  Patient's daughter went to see the patient this morning and realized the extent of the laceration and she cleaned the wound and wrapped it up and brought him here.  The history is provided by the patient and medical records.    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   LIMA to LAD, SVG to Diag, SVG to OM1, SVG to RCA, EVH via right thigh and leg    Patient Active Problem List   Diagnosis Date Noted   RBBB 09/11/2018   Cellulitis 09/11/2018   HLD (hyperlipidemia) 09/11/2018   PSVT (paroxysmal supraventricular tachycardia) (HCC) 08/29/2018   S/P CABG x 4 08/27/2018   Essential hypertension    NSTEMI (non-ST elevated myocardial infarction) (HCC) 08/25/2018    Past Surgical History:  Procedure Laterality Date   CHONDROPLASTY  06/25/2014   Procedure: CHONDROPLASTY;  Surgeon: Dayne Even, MD;  Location: [facility];  Service: Orthopedics;;   CORONARY ARTERY BYPASS GRAFT N/A 08/27/2018   Procedure: CORONARY ARTERY BYPASS GRAFTING (CABG) x four,  using left internal mammary artery and right leg greater saphenous vein harvested endoscopically;  Surgeon: Gardenia Jump, MD;  Location:  [facility] OR;  Service: Open Heart Surgery;  Laterality: N/A;   CORONARY ARTERY BYPASS GRAFT N/A 08/27/2018   Procedure: EXPLORATION POST-OP HEART FOR BLEEDING;  Surgeon: Gardenia Jump, MD;  Location: [facility] OR;  Service: Open Heart Surgery;  Laterality: N/A;   Growth removed from nose     age 39 yrs.   KNEE ARTHROSCOPY WITH MEDIAL MENISECTOMY Left 06/25/2014   Procedure: LEFT ARTHROSCOPY KNEE,partial medial menisectomy;  Surgeon: Dayne Even, MD;  Location: [facility];  Service: Orthopedics;  Laterality: Left;   LEFT HEART CATH AND CORONARY ANGIOGRAPHY N/A 08/26/2018   Procedure: LEFT HEART CATH AND CORONARY ANGIOGRAPHY;  Surgeon: Millicent Ally, MD;  Location: [facility];  Service: Cardiovascular;  Laterality: N/A;   Lower back surgery     Pinched nerve - 2010   TEE WITHOUT CARDIOVERSION N/A 08/27/2018   Procedure: TRANSESOPHAGEAL ECHOCARDIOGRAM (TEE);  Surgeon: Gardenia Jump, MD;  Location: [facility] OR;  Service: Open Heart Surgery;  Laterality: N/A;   Upper back surgery     Plate and pins       Home Medications    Prior to Admission medications   Medication Sig Start Date End Date Taking? Authorizing Provider  acetaminophen  (TYLENOL ) 500 MG tablet Take 500 mg by mouth every 6 (six) hours as needed for headache (pain).    Yes [provider]  amLODipine  (NORVASC ) 10 MG  tablet Take 10 mg by mouth daily.   Yes [provider]  aspirin  EC 81 MG tablet Take 81 mg by mouth every other day.   Yes [provider]  atorvastatin  (LIPITOR ) 40 MG tablet Take 1 tablet (40 mg total) by mouth daily. SCHEDULE OFFICE VISIT FOR FUTURE REFILLS 04/03/21  Yes Avanell Leigh, MD  cephALEXin  (KEFLEX ) 500 MG capsule Take 1 capsule (500 mg total) by mouth 4 (four) times daily for 7 days. 08/10/23 08/17/23 Yes Mariaeduarda Defranco A, NP  chlorthalidone  (HYGROTON ) 25 MG tablet Take 25 mg by mouth daily.   Yes [provider]  losartan  (COZAAR ) 50 MG tablet Take 2  tablets (100 mg total) by mouth daily. 09/02/18  Yes Zimmerman, Donielle M, PA-C  Multiple Vitamin (MULTIVITAMIN WITH MINERALS) TABS tablet Take 1 tablet by mouth daily.   Yes [provider]  mupirocin cream (BACTROBAN) 2 % Apply 1 Application topically 2 (two) times daily. 08/10/23  Yes Karon Packer, NP    Family History Family History  Problem Relation Age of Onset   Heart attack Father 66    Social History Social History   Tobacco Use   Smoking status: Former    Types: Cigarettes   Smokeless tobacco: Never   Tobacco comments:    Quit smoking 50 years ago  Substance Use Topics   Alcohol use: No   Drug use: No     Allergies   Patient has no known allergies.   Review of Systems Review of Systems  Per HPI  Physical Exam Triage Vital Signs ED Triage Vitals  Encounter Vitals Group     BP 08/10/23 1234 (!) 143/72     Systolic BP Percentile --      Diastolic BP Percentile --      Pulse Rate 08/10/23 1234 (!) 55     Resp 08/10/23 1234 18     Temp 08/10/23 1234 (!) 97.4 F (36.3 C)     Temp Source 08/10/23 1234 Oral     SpO2 08/10/23 1234 96 %     Weight 08/10/23 1233 134 lb 14.7 oz (61.2 kg)     Height 08/10/23 1233 5' 2.5" (1.588 m)     Head Circumference --      Peak Flow --      Pain Score 08/10/23 1232 0     Pain Loc --      Pain Education --      Exclude from Growth Chart --    No data found.  Updated Vital Signs BP (!) 143/72 (BP Location: Left Arm)   Pulse (!) 55   Temp (!) 97.4 F (36.3 C) (Oral)   Resp 18   Ht 5' 2.5" (1.588 m)   Wt 134 lb 14.7 oz (61.2 kg)   SpO2 96%   BMI 24.28 kg/m   Visual Acuity Right Eye Distance:   Left Eye Distance:   Bilateral Distance:    Right Eye Near:   Left Eye Near:    Bilateral Near:     Physical Exam Vitals and nursing note reviewed.  Constitutional:      General: He is awake. He is not in acute distress.    Appearance: Normal appearance. He is well-developed and well-groomed. He is  not ill-appearing.  Skin:    General: Skin is warm and dry.     Findings: Abrasion and laceration present.     Comments: Approximately 5 cm x 2 cm laceration noted to the anterior lower  right leg.  With skin tear abrasion extending from the top of the laceration to just below the knee as pictured below.   Neurological:     Mental Status: He is alert.  Psychiatric:        Behavior: Behavior is cooperative.      UC Treatments / Results  Labs (all labs ordered are listed, but only abnormal results are displayed) Labs Reviewed - No data to display  EKG   Radiology No results found.  Procedures Procedures (including critical care time)  Medications Ordered in UC Medications  Tdap (BOOSTRIX) injection 0.5 mL (0.5 mLs Intramuscular Given 08/10/23 1327)    Initial Impression / Assessment and Plan / UC Course  I have reviewed the triage vital signs and the nursing notes.  Pertinent labs & imaging results that were available during my care of the patient were reviewed by me and considered in my medical decision making (see chart for details).     Patient is well-appearing.  Vitals are stable.  There is an approximately 5 cm x 2 cm laceration noted to the anterior lower right leg.  There is a skin tear abrasion extending from the top of the laceration to just below the knee as pictured above.  Deferred laceration repair due to skin being too thin to reattach as well as difficulty approximated wound edges.   Clinical staff cleaned the wound and applied bacitracin and a dressing.  Updated Tdap in clinic.  Prescribed Keflex  and mupirocin for infection prevention.  Discussed proper wound care.  Discussed strict return and ER precautions. Final Clinical Impressions(s) / UC Diagnoses   Final diagnoses:  Laceration of right lower extremity, initial encounter     Discharge Instructions      Start taking Keflex  4 times daily for 7 days for infection prevention.  Keep the wound clean  and dry. Keep the wound completely dry for the next 24 hours.  Change the dressing twice daily unless the dressing is visibly soiled. This includes changing the dressing when it starts to smell, or when it gets wet or dirty. Clean the wound twice each day. To clean your wound: Wash your hands with soap and water for at least 20 seconds before and after touching your wound or changing your dressing. If soap and water are not available, use hand sanitizer. Remove the non-adherent/non-stick guaze and tape slowly as not to pull the skin or reopen the wound. Clean the wound with water or irrigation solution such as saline spray or wound cleaner. Pat the wound dry with a clean guaze. Do not rub the wound. Apply a thin layer of antibiotic ointment  to the wound as told by your health care provider. This will prevent infection and keep the dressing from sticking to the wound. Apply a new dressing of non-stick guaze first then secure to the skin with bandage or paper tape. Check your wound every day for signs of infection. Watch for: More redness, swelling, or pain. Fluid or blood. Warmth. Pus or a bad smell. Do not take baths, swim, or do anything that puts your wound underwater until your health care provider approves. Do not scratch or pick at the wound. Do not usedisinfectants or antiseptics, such as rubbing alcohol, to clean your wound unless told by your health care provider. If you notice purulent drainage, spreading redness, increased swelling, and/or the surrounding area becomes hot to touch return here for re-evaluation.  If you develop a fever, weakness, or confusion please seek immediate  medical treatment in the emergency department.   ED Prescriptions     Medication Sig Dispense Auth. Provider   cephALEXin  (KEFLEX ) 500 MG capsule Take 1 capsule (500 mg total) by mouth 4 (four) times daily for 7 days. 28 capsule Rosevelt Constable, Anjana Cheek A, NP   mupirocin cream (BACTROBAN) 2 % Apply 1 Application  topically 2 (two) times daily. 15 g Levora Reas A, NP      PDMP not reviewed this encounter.   Levora Reas A, NP 08/10/23 1339

## 2023-08-15 DIAGNOSIS — S81811D Laceration without foreign body, right lower leg, subsequent encounter: Secondary | ICD-10-CM | POA: Diagnosis not present

## 2023-08-19 ENCOUNTER — Ambulatory Visit (HOSPITAL_COMMUNITY)
Admission: RE | Admit: 2023-08-19 | Discharge: 2023-08-19 | Disposition: A | Discharge status: Home / Self Care | Source: Ambulatory Visit | Attending: Internal Medicine | Admitting: Internal Medicine

## 2023-08-19 ENCOUNTER — Other Ambulatory Visit (HOSPITAL_COMMUNITY): Payer: Self-pay | Admitting: Internal Medicine

## 2023-08-19 ENCOUNTER — Encounter (HOSPITAL_BASED_OUTPATIENT_CLINIC_OR_DEPARTMENT_OTHER): Attending: Internal Medicine | Admitting: Internal Medicine

## 2023-08-19 DIAGNOSIS — S8011XA Contusion of right lower leg, initial encounter: Secondary | ICD-10-CM | POA: Insufficient documentation

## 2023-08-19 DIAGNOSIS — S81801A Unspecified open wound, right lower leg, initial encounter: Secondary | ICD-10-CM | POA: Diagnosis not present

## 2023-08-19 DIAGNOSIS — S8991XA Unspecified injury of right lower leg, initial encounter: Secondary | ICD-10-CM | POA: Diagnosis not present

## 2023-08-19 DIAGNOSIS — W228XXA Striking against or struck by other objects, initial encounter: Secondary | ICD-10-CM | POA: Insufficient documentation

## 2023-08-19 DIAGNOSIS — T798XXA Other early complications of trauma, initial encounter: Secondary | ICD-10-CM | POA: Insufficient documentation

## 2023-08-20 DIAGNOSIS — S81801A Unspecified open wound, right lower leg, initial encounter: Secondary | ICD-10-CM | POA: Diagnosis not present

## 2023-08-26 ENCOUNTER — Encounter (HOSPITAL_BASED_OUTPATIENT_CLINIC_OR_DEPARTMENT_OTHER): Admitting: Internal Medicine

## 2023-08-26 DIAGNOSIS — T798XXA Other early complications of trauma, initial encounter: Secondary | ICD-10-CM | POA: Diagnosis not present

## 2023-08-26 DIAGNOSIS — S81801A Unspecified open wound, right lower leg, initial encounter: Secondary | ICD-10-CM | POA: Diagnosis not present

## 2023-09-02 ENCOUNTER — Encounter (HOSPITAL_BASED_OUTPATIENT_CLINIC_OR_DEPARTMENT_OTHER): Admitting: Internal Medicine

## 2023-09-02 DIAGNOSIS — L97815 Non-pressure chronic ulcer of other part of right lower leg with muscle involvement without evidence of necrosis: Secondary | ICD-10-CM

## 2023-09-02 DIAGNOSIS — T798XXA Other early complications of trauma, initial encounter: Secondary | ICD-10-CM

## 2023-09-02 DIAGNOSIS — S81801A Unspecified open wound, right lower leg, initial encounter: Secondary | ICD-10-CM

## 2023-09-09 ENCOUNTER — Encounter (HOSPITAL_BASED_OUTPATIENT_CLINIC_OR_DEPARTMENT_OTHER): Admitting: General Surgery

## 2023-09-09 ENCOUNTER — Encounter (HOSPITAL_BASED_OUTPATIENT_CLINIC_OR_DEPARTMENT_OTHER): Attending: Internal Medicine | Admitting: Internal Medicine

## 2023-09-09 DIAGNOSIS — I2510 Atherosclerotic heart disease of native coronary artery without angina pectoris: Secondary | ICD-10-CM | POA: Diagnosis not present

## 2023-09-09 DIAGNOSIS — L97815 Non-pressure chronic ulcer of other part of right lower leg with muscle involvement without evidence of necrosis: Secondary | ICD-10-CM | POA: Diagnosis not present

## 2023-09-09 DIAGNOSIS — Z951 Presence of aortocoronary bypass graft: Secondary | ICD-10-CM | POA: Diagnosis not present

## 2023-09-09 DIAGNOSIS — S81801A Unspecified open wound, right lower leg, initial encounter: Secondary | ICD-10-CM | POA: Diagnosis not present

## 2023-09-09 DIAGNOSIS — T798XXA Other early complications of trauma, initial encounter: Secondary | ICD-10-CM | POA: Diagnosis not present

## 2023-09-09 DIAGNOSIS — X58XXXA Exposure to other specified factors, initial encounter: Secondary | ICD-10-CM | POA: Diagnosis not present

## 2023-09-16 ENCOUNTER — Encounter (HOSPITAL_BASED_OUTPATIENT_CLINIC_OR_DEPARTMENT_OTHER): Admitting: Internal Medicine

## 2023-09-16 DIAGNOSIS — T798XXA Other early complications of trauma, initial encounter: Secondary | ICD-10-CM

## 2023-09-16 DIAGNOSIS — S81801A Unspecified open wound, right lower leg, initial encounter: Secondary | ICD-10-CM

## 2023-09-16 DIAGNOSIS — L97815 Non-pressure chronic ulcer of other part of right lower leg with muscle involvement without evidence of necrosis: Secondary | ICD-10-CM

## 2023-09-17 ENCOUNTER — Ambulatory Visit (HOSPITAL_COMMUNITY)
Admission: RE | Admit: 2023-09-17 | Discharge: 2023-09-17 | Disposition: A | Discharge status: Home / Self Care | Source: Ambulatory Visit | Attending: Vascular Surgery | Admitting: Vascular Surgery

## 2023-09-17 ENCOUNTER — Other Ambulatory Visit (HOSPITAL_COMMUNITY): Payer: Self-pay | Admitting: Internal Medicine

## 2023-09-17 DIAGNOSIS — T148XXA Other injury of unspecified body region, initial encounter: Secondary | ICD-10-CM

## 2023-09-17 DIAGNOSIS — S81811A Laceration without foreign body, right lower leg, initial encounter: Secondary | ICD-10-CM

## 2023-09-17 LAB — VAS US ABI WITH/WO TBI
Left ABI: 1.19
Right ABI: 1.21

## 2023-09-19 DIAGNOSIS — S81801A Unspecified open wound, right lower leg, initial encounter: Secondary | ICD-10-CM | POA: Diagnosis not present

## 2023-09-22 ENCOUNTER — Emergency Department (HOSPITAL_COMMUNITY)
Admission: EM | Admit: 2023-09-22 | Discharge: 2023-09-22 | Disposition: A | Discharge status: Home / Self Care | Attending: Emergency Medicine | Admitting: Emergency Medicine

## 2023-09-22 ENCOUNTER — Other Ambulatory Visit: Payer: Self-pay

## 2023-09-22 ENCOUNTER — Emergency Department (HOSPITAL_COMMUNITY)

## 2023-09-22 DIAGNOSIS — Z951 Presence of aortocoronary bypass graft: Secondary | ICD-10-CM | POA: Insufficient documentation

## 2023-09-22 DIAGNOSIS — Z7982 Long term (current) use of aspirin: Secondary | ICD-10-CM | POA: Diagnosis not present

## 2023-09-22 DIAGNOSIS — I2510 Atherosclerotic heart disease of native coronary artery without angina pectoris: Secondary | ICD-10-CM | POA: Insufficient documentation

## 2023-09-22 DIAGNOSIS — E876 Hypokalemia: Secondary | ICD-10-CM | POA: Diagnosis not present

## 2023-09-22 DIAGNOSIS — I25708 Atherosclerosis of coronary artery bypass graft(s), unspecified, with other forms of angina pectoris: Secondary | ICD-10-CM | POA: Diagnosis not present

## 2023-09-22 DIAGNOSIS — Z79899 Other long term (current) drug therapy: Secondary | ICD-10-CM | POA: Insufficient documentation

## 2023-09-22 DIAGNOSIS — I10 Essential (primary) hypertension: Secondary | ICD-10-CM

## 2023-09-22 DIAGNOSIS — R0789 Other chest pain: Secondary | ICD-10-CM | POA: Diagnosis not present

## 2023-09-22 DIAGNOSIS — I700 Atherosclerosis of aorta: Secondary | ICD-10-CM | POA: Diagnosis not present

## 2023-09-22 DIAGNOSIS — R072 Precordial pain: Secondary | ICD-10-CM

## 2023-09-22 DIAGNOSIS — I771 Stricture of artery: Secondary | ICD-10-CM | POA: Diagnosis not present

## 2023-09-22 DIAGNOSIS — R079 Chest pain, unspecified: Secondary | ICD-10-CM | POA: Diagnosis not present

## 2023-09-22 LAB — BASIC METABOLIC PANEL WITH GFR
Anion gap: 6 (ref 5–15)
BUN: 30 mg/dL — ABNORMAL HIGH (ref 8–23)
CO2: 28 mmol/L (ref 22–32)
Calcium: 9 mg/dL (ref 8.9–10.3)
Chloride: 101 mmol/L (ref 98–111)
Creatinine, Ser: 1.13 mg/dL (ref 0.61–1.24)
GFR, Estimated: >60 mL/min (ref >60)
Glucose, Bld: 106 mg/dL — ABNORMAL HIGH (ref 70–99)
Potassium: 3.1 mmol/L — ABNORMAL LOW (ref 3.5–5.1)
Sodium: 135 mmol/L (ref 135–145)

## 2023-09-22 LAB — CBC
HCT: 39.8 % (ref 39.0–52.0)
Hemoglobin: 13.4 g/dL (ref 13.0–17.0)
MCH: 31.5 pg (ref 26.0–34.0)
MCHC: 33.7 g/dL (ref 30.0–36.0)
MCV: 93.6 fL (ref 80.0–100.0)
Platelets: 225 K/uL (ref 150–400)
RBC: 4.25 MIL/uL (ref 4.22–5.81)
RDW: 13.1 % (ref 11.5–15.5)
WBC: 7.5 K/uL (ref 4.0–10.5)
nRBC: 0 % (ref 0.0–0.2)

## 2023-09-22 LAB — TROPONIN I (HIGH SENSITIVITY)
Troponin I (High Sensitivity): 16 ng/L
Troponin I (High Sensitivity): 12 ng/L (ref <18)

## 2023-09-22 MED ORDER — POTASSIUM CHLORIDE CRYS ER 20 MEQ PO TBCR
40.0000 meq | EXTENDED_RELEASE_TABLET | Freq: Once | ORAL | Status: AC
  Administered 2023-09-22: 40 meq via ORAL
  Filled 2023-09-22: qty 2

## 2023-09-22 NOTE — ED Provider Notes (Signed)
 [place] EMERGENCY DEPARTMENT AT [facility] Provider Note   CSN: [id number] Arrival date & time: 09/22/23  1057     Patient presents with: Chest Pain   Zachary [name] is a 83 y.o. male.   The history is provided by the patient and medical records. No language interpreter was used.  Chest Pain Pain location:  L chest Pain quality: aching, dull and pressure   Pain radiates to:  Does not radiate Pain severity:  Moderate Onset quality:  Sudden Duration:  20 minutes Timing:  Constant Progression:  Resolved Chronicity:  Recurrent Relieved by:  Nothing Worsened by:  Nothing Ineffective treatments:  None tried Associated symptoms: heartburn   Associated symptoms: no abdominal pain, no back pain, no cough, no fatigue, no fever, no headache, no lower extremity edema, no nausea, no palpitations, no shortness of breath, no syncope and no vomiting   Risk factors: coronary artery disease        Prior to Admission medications   Medication Sig Start Date End Date Taking? Authorizing Provider  acetaminophen  (TYLENOL ) 500 MG tablet Take 500 mg by mouth every 6 (six) hours as needed for headache (pain).     [provider]  amLODipine  (NORVASC ) 10 MG tablet Take 10 mg by mouth daily.    [provider]  aspirin  EC 81 MG tablet Take 81 mg by mouth every other day.    [provider]  atorvastatin  (LIPITOR ) 40 MG tablet Take 1 tablet (40 mg total) by mouth daily. SCHEDULE OFFICE VISIT FOR FUTURE REFILLS 04/03/21   Court Dorn PARAS, MD  chlorthalidone  (HYGROTON ) 25 MG tablet Take 25 mg by mouth daily.    [provider]  losartan  (COZAAR ) 50 MG tablet Take 2 tablets (100 mg total) by mouth daily. 09/02/18   Zimmerman, Donielle M, PA-C  Multiple Vitamin (MULTIVITAMIN WITH MINERALS) TABS tablet Take 1 tablet by mouth daily.    [provider]  mupirocin  cream (BACTROBAN ) 2 % Apply 1 Application topically 2 (two) times daily. 08/10/23    Johnie Rumaldo LABOR, NP    Allergies: Patient has no known allergies.    Review of Systems  Constitutional:  Negative for chills, fatigue and fever.  HENT:  Negative for congestion.   Respiratory:  Negative for cough, chest tightness, shortness of breath and wheezing.   Cardiovascular:  Positive for chest pain. Negative for palpitations, leg swelling and syncope.  Gastrointestinal:  Positive for heartburn. Negative for abdominal pain, constipation, diarrhea, nausea and vomiting.  Genitourinary:  Negative for dysuria.  Musculoskeletal:  Negative for back pain.  Neurological:  Negative for light-headedness and headaches.  Psychiatric/Behavioral:  Negative for agitation.   All other systems reviewed and are negative.   Updated Vital Signs BP (!) 140/59 (BP Location: Right Arm)   Pulse (!) 50   Temp 98 F (36.7 C)   Resp 16   SpO2 100%   Physical Exam Vitals and nursing note reviewed.  Constitutional:      General: He is not in acute distress.    Appearance: He is well-developed. He is not ill-appearing, toxic-appearing or diaphoretic.  HENT:     Head: Normocephalic and atraumatic.  Eyes:     Conjunctiva/sclera: Conjunctivae normal.     Pupils: Pupils are equal, round, and reactive to light.  Cardiovascular:     Rate and Rhythm: Normal rate and regular rhythm.     Heart sounds: Normal heart sounds. No murmur heard. Pulmonary:     Effort: Pulmonary  effort is normal. No respiratory distress.     Breath sounds: Normal breath sounds. No wheezing, rhonchi or rales.  Chest:     Chest wall: No tenderness.  Abdominal:     Palpations: Abdomen is soft.     Tenderness: There is no abdominal tenderness.  Musculoskeletal:        General: No swelling.     Cervical back: Neck supple.     Right lower leg: No edema.     Left lower leg: No edema.  Skin:    General: Skin is warm and dry.     Capillary Refill: Capillary refill takes less than 2 seconds.  Neurological:     General: No  focal deficit present.     Mental Status: He is alert.  Psychiatric:        Mood and Affect: Mood normal.     (all labs ordered are listed, but only abnormal results are displayed) Labs Reviewed  BASIC METABOLIC PANEL WITH GFR - Abnormal; Notable for the following components:      Result Value   Potassium 3.1 (*)    Glucose, Bld 106 (*)    BUN 30 (*)    All other components within normal limits  CBC  TROPONIN I (HIGH SENSITIVITY)  TROPONIN I (HIGH SENSITIVITY)    EKG: EKG Interpretation Date/Time:  Sunday September 22 2023 11:35:36 EDT Ventricular Rate:  50 PR Interval:  239 QRS Duration:  171 QT Interval:  481 QTC Calculation: 439 R Axis:   -83  Text Interpretation: Sinus or ectopic atrial rhythm Prolonged PR interval RBBB and LAFB Probable left ventricular hypertrophy when compared to prior, slower rate No STEMI Confirmed by Ginger Barefoot ([id number]) on 09/22/2023 2:45:09 PM  Radiology: DG Chest 2 View Result Date: 09/22/2023 CLINICAL DATA:  Chest pain.  History of bypass surgery. EXAM: CHEST - 2 VIEW COMPARISON:  09/29/2018 FINDINGS: Stable surgical changes from coronary artery bypass surgery. The heart is normal in size. The mediastinal hilar contours are normal. Stable tortuosity and calcification of the thoracic aorta. The lungs are clear of an acute process. No infiltrates, edema or effusions. No pulmonary lesions or pneumothorax. The bony thorax is intact. IMPRESSION: No acute cardiopulmonary findings. Electronically Signed   By: MYRTIS Stammer M.D.   On: 09/22/2023 12:34     Procedures   Medications Ordered in the ED  potassium chloride  SA (KLOR-CON  M) CR tablet 40 mEq (40 mEq Oral Given 09/22/23 1840)                                    Medical Decision Making Amount and/or Complexity of Data Reviewed Labs: ordered. Radiology: ordered.    Zachary [name] is a 83 y.o. male with a past medical history significant for hypertension, hyperlipidemia, paroxysmal SVT, and  previous NSTEMI with CABG x 4 who presents with chest pain.  According to patient, he was at church when he started having left-sided chest pressure.  It lasted about 15 minutes but felt the exact same as when he had his MI requiring bypass surgery.  He had no preceding symptoms over the last several days and otherwise denies recent fevers, chills, congestion, or cough.  He reported no diaphoresis nausea, vomiting, or shortness of breath with it.  It was just the pressure in his left chest.  It did not radiate.  He otherwise had no changes from his normal breakfast with a  protein bar and coffee.  He reports no abdominal pain or flank pain.  No urinary changes.  No leg pain or leg swelling.  No trauma.  Otherwise he is feeling at his baseline.  Denies palpitations or lightheadedness.  He was given aspirin  en route.  EKG on arrival appears similar to prior with no STEMI.  Does have bradycardia.  On exam, lungs clear.  Chest nontender.  Abdomen nontender.  Good pulses in extremities.  Patient resting comfortably without acute distress.  Patient is pain-free now.  Given the patient's history of previous MI with similar discomfort, anticipate getting workup to look for cardiac etiology and will call cardiology as well.  He does have swelling of his right leg from a wound that has been chronic and managed by the wound team.  He reports he is doing well and has had recent workup showing it was improving.  Initial workup shows normal troponin.  Will trend.  CBC reassuring.  BMP showed slightly low potassium but otherwise normal kidney function.  Chest x-ray revealed no acute cardiopulmonary findings.  Will call cardiology to discuss disposition.  3:36 PM Cardio was called and will come see patient.  Anticipate follow-up on delta troponin and reassessment and recommendations.  Cardiology saw patient and feel that if the second troponin is reassuring he is likely safe for discharge home and close follow-up.   Will wait for the second troponin and reassess.  6:41 PM Second troponin is normal.  Will discharge for outpatient follow-up.      Final diagnoses:  Atypical chest pain  Hypokalemia    ED Discharge Orders     None      Clinical Impression: 1. Atypical chest pain   2. Hypokalemia     Disposition: Discharge  Condition: Good  I have discussed the results, Dx and Tx plan with the pt(& family if present). He/she/they expressed understanding and agree(s) with the plan. Discharge instructions discussed at great length. Strict return precautions discussed and pt &/or family have verbalized understanding of the instructions. No further questions at time of discharge.    New Prescriptions   No medications on file    Follow Up: your cardiologist     [facility] Emergency Department at [facility] [address] [place]  [phone]        Joleah Kosak, Lonni PARAS, MD 09/22/23 [phone]

## 2023-09-22 NOTE — ED Notes (Signed)
 Patient Alert and oriented to baseline. Stable and ambulatory to baseline. Patient verbalized understanding of the discharge instructions.  Patient belongings were taken by the patient.

## 2023-09-22 NOTE — ED Triage Notes (Addendum)
 Pt BIB [facility] from home with reports of CP. PT reports 10-15 min period of CP. Reports the same pain when he had bypass. Pt has no chest pain at this time. 324 ASA given PTA.

## 2023-09-22 NOTE — Discharge Instructions (Addendum)
 Your history, exam, workup today was reassuring.  Given your history of previous heart attack it was reasonable to come get evaluated for your chest pain however your heart enzymes were negative both times and the cardiology team feels you are safe for discharge home and outpatient follow-up.  If any symptoms recur, change, or worsen acutely, return to the nearest emergency department.  Please rest and stay hydrated and follow-up with them.

## 2023-09-22 NOTE — Consult Note (Signed)
 Cardiology Consultation   Patient ID: Zachary [name] MRN: [id number]; DOB: 07/19/1940  Admit date: 09/22/2023 Date of Consult: 09/22/2023  PCP:  [facility]   [facility] Providers Cardiologist:  Dorn Lesches, MD        Patient Profile: Zachary [name] is a 83 y.o. male with a hx of CAD s/p CABG 2020 who is being seen 09/22/2023 for the evaluation of chest pain at rest at the request of Dr. Medford Alosa.  History of Present Illness: [name] is an active 83 year old gentleman with a history of four-vessel bypass surgery (LIMA-LAD, SVG-diagonal, SVG-OM, SVG-RCA) in 2020 who had chest discomfort during light activity today.  The service at church had just finished and he walked a short distance when he experienced indigestion-like discomfort in his left chest, very similar to his prebypass angina pectoris.  He sat down for a few minutes and the symptoms subsided in less than 15 minutes.  He started to go back home but then decided this might not be a wise decision since he lives alone and came to the emergency room.  He has not had any recurrence of chest pain since then.  He did not have significant shortness of breath, diaphoresis, palpitations, dizziness or other complaints in association with the chest discomfort.  He is otherwise quite healthy other than treated hypertension.  He does not have diabetes mellitus and does not smoke.  He has had a variety of orthopedic procedures.  He lives alone, being widowed after his wife passed away last Jan 01, 2024.  His ECG does not show acute ischemic changes but might be a rather nonspecific study due to underlying significant conduction abnormalities (broad QRS with RBBB plus LAFB).  His first high-sensitivity troponin is normal at 16.  Previous echo 2020 showed normal left ventricular systolic function with EF 55 to 60% and no significant valvular abnormalities.  Traumatic wound to the right pretibial area has  been healing slowly over the last few months, improved substantially with visits to the wound care clinic.  ABI performed a few days ago shows normal arterial supply.   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   LIMA to LAD, SVG to Diag, SVG to OM1, SVG to RCA, EVH via right thigh and leg    Past Surgical History:  Procedure Laterality Date   CHONDROPLASTY  06/25/2014   Procedure: CHONDROPLASTY;  Surgeon: Maude Herald, MD;  Location: [facility];  Service: Orthopedics;;   CORONARY ARTERY BYPASS GRAFT N/A 08/27/2018   Procedure: CORONARY ARTERY BYPASS GRAFTING (CABG) x four,  using left internal mammary artery and right leg greater saphenous vein harvested endoscopically;  Surgeon: Dusty Sudie DEL, MD;  Location: [facility] OR;  Service: Open Heart Surgery;  Laterality: N/A;   CORONARY ARTERY BYPASS GRAFT N/A 08/27/2018   Procedure: EXPLORATION POST-OP HEART FOR BLEEDING;  Surgeon: Dusty Sudie DEL, MD;  Location: [facility] OR;  Service: Open Heart Surgery;  Laterality: N/A;   Growth removed from nose     age 66 yrs.   KNEE ARTHROSCOPY WITH MEDIAL MENISECTOMY Left 06/25/2014   Procedure: LEFT ARTHROSCOPY KNEE,partial medial menisectomy;  Surgeon: Maude Herald, MD;  Location: [facility];  Service: Orthopedics;  Laterality: Left;   LEFT HEART CATH AND CORONARY ANGIOGRAPHY N/A 08/26/2018   Procedure: LEFT HEART CATH AND CORONARY ANGIOGRAPHY;  Surgeon: Burnard Debby LABOR, MD;  Location: [facility] INVASIVE  CV LAB;  Service: Cardiovascular;  Laterality: N/A;   Lower back surgery     Pinched nerve - 2010   TEE WITHOUT CARDIOVERSION N/A 08/27/2018   Procedure: TRANSESOPHAGEAL ECHOCARDIOGRAM (TEE);  Surgeon: Dusty Sudie DEL, MD;  Location: [facility] OR;  Service: Open Heart Surgery;  Laterality: N/A;   Upper back surgery     Plate and pins       Scheduled Meds:  Continuous Infusions:  PRN Meds:   Allergies:   No  Known Allergies  Social History:   Social History   Socioeconomic History   Marital status: Married    Spouse name: Not on file   Number of children: Not on file   Years of education: Not on file   Highest education level: Not on file  Occupational History   Not on file  Tobacco Use   Smoking status: Former    Types: Cigarettes   Smokeless tobacco: Never   Tobacco comments:    Quit smoking 50 years ago  Substance and Sexual Activity   Alcohol use: No   Drug use: No   Sexual activity: Not on file  Other Topics Concern   Not on file  Social History Narrative   Not on file   Social Drivers of Health   Financial Resource Strain: Not on file  Food Insecurity: No Food Insecurity (05/09/2022)   Hunger Vital Sign    Worried About Running Out of Food in the Last Year: Never true    Ran Out of Food in the Last Year: Never true  Transportation Needs: No Transportation Needs (05/09/2022)   PRAPARE - Administrator, Civil Service (Medical): No    Lack of Transportation (Non-Medical): No  Physical Activity: Not on file  Stress: Not on file  Social Connections: Not on file  Intimate Partner Violence: Not on file    Family History:    Family History  Problem Relation Age of Onset   Heart attack Father 77     ROS:  Please see the history of present illness.  Makayleigh Poliquin Dr. Garnette cardiology try to call you but it went to your voicemail sorry no problem no All other ROS reviewed and negative.     Physical Exam/Data: Vitals:   09/22/23 1142 09/22/23 1500  BP: (!) 140/59 (!) 147/56  Pulse: (!) 50 (!) 45  Resp: 16 19  Temp: 98 F (36.7 C)   SpO2: 100% 99%   No intake or output data in the 24 hours ending 09/22/23 1613    08/10/2023   12:33 PM 12/11/2022    1:21 PM 09/14/2019    1:05 PM  Last 3 Weights  Weight (lbs) 134 lb 14.7 oz 135 lb 135 lb 9.6 oz  Weight (kg) 61.2 kg 61.236 kg 61.508 kg     There is no height or weight on file to calculate BMI.  General:   Well nourished, well developed, in no acute distress lean and fit, appears younger than stated age HEENT: normal Neck: no JVD Vascular: No carotid bruits; Distal pulses 2+ bilaterally Cardiac:  normal S1, S2; RRR; no murmur.  Normal distal pulses Lungs:  clear to auscultation bilaterally, no wheezing, rhonchi or rales  Abd: soft, nontender, no hepatomegaly  Ext: no edema Musculoskeletal:  No deformities, BUE and BLE strength normal and equal Skin: warm and dry.  Dressing covering superficial wound right pretibial area.  No evidence of infection. 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 Lipids No results for input(s): CHOL, TRIG, HDL, LABVLDL, LDLCALC, CHOLHDL in the last 168 hours.  Hematology Recent Labs  Lab 09/22/23 1144  WBC 7.5  RBC 4.25  HGB 13.4  HCT 39.8  MCV 93.6  MCH 31.5  MCHC 33.7  RDW 13.1  PLT 225   Thyroid  No results for input(s): TSH, FREET4 in the last 168 hours.  BNPNo results for input(s): BNP, PROBNP in the last 168 hours.  DDimer No results for input(s): DDIMER in the last 168 hours.  Radiology/Studies:  DG Chest 2 View Result Date: 09/22/2023 CLINICAL DATA:  Chest pain.  History of bypass surgery. EXAM: CHEST - 2 VIEW COMPARISON:  09/29/2018 FINDINGS:  Stable surgical changes from coronary artery bypass surgery. The heart is normal in size. The mediastinal hilar contours are normal. Stable tortuosity and calcification of the thoracic aorta. The lungs are clear of an acute process. No infiltrates, edema or effusions. No pulmonary lesions or pneumothorax. The bony thorax is intact. IMPRESSION: No acute cardiopulmonary findings. Electronically Signed   By: MYRTIS Stammer M.D.   On: 09/22/2023 12:34     Assessment and Plan: CAD status post CABG: Today's symptoms are concerning since they are reminiscent of his prebypass angina, but otherwise there are no high risk features on ECG or cardiac enzymes and his symptoms have resolved quickly and completely.  If the second troponin remains unchanged recommend outpatient evaluation with a nuclear perfusion study.  If the second set of cardiac enzymes is abnormal he will be admitted, placed on intravenous heparin  with a plan for cardiac catheterization tomorrow. HTN: Well-controlled. Hypokalemia: Likely due to chlorthalidone  therapy. Recommend potassium supplementation.  He is already on maximum dose of losartan  and amlodipine .  Consider dropping the dose of chlorthalidone  in half and adding spironolactone or a different potassium sparing diuretic.   Risk Assessment/Risk Scores:              For questions or updates, please contact [facility] Please consult [email or web address] for contact info under    Signed, Jerel Balding, MD  09/22/2023 4:13 PM

## 2023-09-22 NOTE — ED Provider Triage Note (Addendum)
 Emergency Medicine Provider Triage Evaluation Note  Zachary [name] , a 83 y.o. male  was evaluated in triage.  Pt complains of chest pain and indigestion that started earlier today.  History of MI.  Being treated for chronic wound on right lower extremity.  Feels that swelling is stable.  Review of Systems  Positive: Negative:   Physical Exam  BP (!) 140/59 (BP Location: Right Arm)   Pulse (!) 50   Temp 98 F (36.7 C)   Resp 16   SpO2 100%  Gen:   Awake, no distress   Resp:  Normal effort  MSK:   Moves extremities without difficulty  Other:  Right lower extremity nonpitting edema, wrapped in dressing  Medical Decision Making  Medically screening exam initiated at 12:35 PM.  Appropriate orders placed.  Zachary [name] was informed that the remainder of the evaluation will be completed by another provider, this initial triage assessment does not replace that evaluation, and the importance of remaining in the ED until their evaluation is complete.  Cardiac workup.  EKG appears unchanged.  Vitals are stable.    Zachary Lynwood DEL, PA-C 09/22/23 1236    Zachary Lynwood DEL, PA-C 09/22/23 1239

## 2023-09-23 ENCOUNTER — Encounter (HOSPITAL_BASED_OUTPATIENT_CLINIC_OR_DEPARTMENT_OTHER): Admitting: Internal Medicine

## 2023-09-23 DIAGNOSIS — S81801A Unspecified open wound, right lower leg, initial encounter: Secondary | ICD-10-CM | POA: Diagnosis not present

## 2023-09-23 DIAGNOSIS — T798XXA Other early complications of trauma, initial encounter: Secondary | ICD-10-CM

## 2023-09-23 DIAGNOSIS — L97815 Non-pressure chronic ulcer of other part of right lower leg with muscle involvement without evidence of necrosis: Secondary | ICD-10-CM

## 2023-09-24 DIAGNOSIS — S81801A Unspecified open wound, right lower leg, initial encounter: Secondary | ICD-10-CM | POA: Diagnosis not present

## 2023-09-30 ENCOUNTER — Encounter (HOSPITAL_BASED_OUTPATIENT_CLINIC_OR_DEPARTMENT_OTHER): Admitting: Internal Medicine

## 2023-09-30 DIAGNOSIS — L97815 Non-pressure chronic ulcer of other part of right lower leg with muscle involvement without evidence of necrosis: Secondary | ICD-10-CM | POA: Diagnosis not present

## 2023-10-07 ENCOUNTER — Ambulatory Visit
Admission: RE | Admit: 2023-10-07 | Discharge: 2023-10-07 | Disposition: A | Discharge status: Home / Self Care | Source: Ambulatory Visit | Attending: Urology

## 2023-10-07 ENCOUNTER — Encounter (HOSPITAL_BASED_OUTPATIENT_CLINIC_OR_DEPARTMENT_OTHER): Attending: Internal Medicine | Admitting: Internal Medicine

## 2023-10-07 DIAGNOSIS — T798XXA Other early complications of trauma, initial encounter: Secondary | ICD-10-CM | POA: Insufficient documentation

## 2023-10-07 DIAGNOSIS — L97815 Non-pressure chronic ulcer of other part of right lower leg with muscle involvement without evidence of necrosis: Secondary | ICD-10-CM | POA: Insufficient documentation

## 2023-10-07 DIAGNOSIS — R972 Elevated prostate specific antigen [PSA]: Secondary | ICD-10-CM | POA: Diagnosis not present

## 2023-10-07 DIAGNOSIS — S81801A Unspecified open wound, right lower leg, initial encounter: Secondary | ICD-10-CM | POA: Insufficient documentation

## 2023-10-07 DIAGNOSIS — X58XXXA Exposure to other specified factors, initial encounter: Secondary | ICD-10-CM | POA: Insufficient documentation

## 2023-10-07 MED ORDER — GADOPICLENOL 0.5 MMOL/ML IV SOLN
6.0000 mL | Freq: Once | INTRAVENOUS | Status: AC | PRN
  Administered 2023-10-07: 6 mL via INTRAVENOUS

## 2023-10-28 ENCOUNTER — Encounter (HOSPITAL_BASED_OUTPATIENT_CLINIC_OR_DEPARTMENT_OTHER): Admitting: Internal Medicine

## 2023-10-28 DIAGNOSIS — S81801A Unspecified open wound, right lower leg, initial encounter: Secondary | ICD-10-CM | POA: Diagnosis not present

## 2023-10-28 DIAGNOSIS — X58XXXA Exposure to other specified factors, initial encounter: Secondary | ICD-10-CM | POA: Diagnosis not present

## 2023-10-28 DIAGNOSIS — L97815 Non-pressure chronic ulcer of other part of right lower leg with muscle involvement without evidence of necrosis: Secondary | ICD-10-CM | POA: Diagnosis not present

## 2023-10-28 DIAGNOSIS — T798XXA Other early complications of trauma, initial encounter: Secondary | ICD-10-CM | POA: Diagnosis not present

## 2023-10-29 DIAGNOSIS — R972 Elevated prostate specific antigen [PSA]: Secondary | ICD-10-CM | POA: Diagnosis not present

## 2023-10-30 DIAGNOSIS — M1711 Unilateral primary osteoarthritis, right knee: Secondary | ICD-10-CM | POA: Diagnosis not present

## 2023-10-31 ENCOUNTER — Telehealth: Payer: Self-pay | Admitting: Cardiovascular Disease

## 2023-10-31 ENCOUNTER — Telehealth: Payer: Self-pay

## 2023-10-31 NOTE — Telephone Encounter (Signed)
  Patient Consent for Virtual Visit         Zachary [name] has provided verbal consent on 10/31/2023 for a virtual visit (video or telephone).  Appointment  scheduled for 11/08/2023 @ 3:20pm. Call patient at [phone]. Med req and consent.    CONSENT FOR VIRTUAL VISIT FOR:  Zachary [name]  By participating in this virtual visit I agree to the following:  I hereby voluntarily request, consent and authorize [facility] and its employed or contracted physicians, physician assistants, nurse practitioners or other licensed health care professionals (the Practitioner), to provide me with telemedicine health care services (the "Services) as deemed necessary by the treating Practitioner. I acknowledge and consent to receive the Services by the Practitioner via telemedicine. I understand that the telemedicine visit will involve communicating with the Practitioner through live audiovisual communication technology and the disclosure of certain medical information by electronic transmission. I acknowledge that I have been given the opportunity to request an in-person assessment or other available alternative prior to the telemedicine visit and am voluntarily participating in the telemedicine visit.  I understand that I have the right to withhold or withdraw my consent to the use of telemedicine in the course of my care at any time, without affecting my right to future care or treatment, and that the Practitioner or I may terminate the telemedicine visit at any time. I understand that I have the right to inspect all information obtained and/or recorded in the course of the telemedicine visit and may receive copies of available information for a reasonable fee.  I understand that some of the potential risks of receiving the Services via telemedicine include:  Delay or interruption in medical evaluation due to technological equipment failure or disruption; Information transmitted may not be sufficient  (e.g. poor resolution of images) to allow for appropriate medical decision making by the Practitioner; and/or  In rare instances, security protocols could fail, causing a breach of personal health information.  Furthermore, I acknowledge that it is my responsibility to provide information about my medical history, conditions and care that is complete and accurate to the best of my ability. I acknowledge that Practitioner's advice, recommendations, and/or decision may be based on factors not within their control, such as incomplete or inaccurate data provided by me or distortions of diagnostic images or specimens that may result from electronic transmissions. I understand that the practice of medicine is not an exact science and that Practitioner makes no warranties or guarantees regarding treatment outcomes. I acknowledge that a copy of this consent can be made available to me via my patient portal [facility] MyChart), or I can request a printed copy by calling the office of [facility].    I understand that my insurance will be billed for this visit.   I have read or had this consent read to me. I understand the contents of this consent, which adequately explains the benefits and risks of the Services being provided via telemedicine.  I have been provided ample opportunity to ask questions regarding this consent and the Services and have had my questions answered to my satisfaction. I give my informed consent for the services to be provided through the use of telemedicine in my medical care

## 2023-10-31 NOTE — Telephone Encounter (Signed)
 Appointment scheduled for 11/08/2023 @ 3:20pm. Med req and consent.

## 2023-10-31 NOTE — Telephone Encounter (Signed)
 CORRECTION ON CLEARANCE REQUEST: TYPE OF CLEARANCE REQUESTED:   MEDICAL AND PHARM  SURGEON NEEDS ASA RECOMMENDATIONS

## 2023-10-31 NOTE — Telephone Encounter (Signed)
   Name: Zachary [name]  DOB: 09-01-1940  MRN: [id number]  Primary Cardiologist: Dorn Lesches, MD   Preoperative team, please contact this patient and set up a phone call appointment for further preoperative risk assessment. Please obtain consent and complete medication review. Thank you for your help.  I confirm that guidance regarding antiplatelet and oral anticoagulation therapy has been completed and, if necessary, noted below.  Regarding ASA therapy, we recommend continuation of ASA throughout the perioperative period.  However, if the surgeon feels that cessation of ASA is required in the perioperative period, it may be stopped 5-7 days prior to surgery with a plan to resume it as soon as felt to be feasible from a surgical standpoint in the post-operative period.   I also confirmed the patient resides in the state of [place] . As per [facility] Medical Board telemedicine laws, the patient must reside in the state in which the provider is licensed.   Wyn Raddle, Jackee Shove, NP 10/31/2023, 2:50 PM [facility]

## 2023-10-31 NOTE — Telephone Encounter (Signed)
   Pre-operative Risk Assessment    Patient Name: Zachary [name]  DOB: February 03, 1941 MRN: [id number]   Date of last office visit: 12/11/22 Date of next office visit: n/a   Request for Surgical Clearance    Procedure:  Right total knee replacement  Date of Surgery:  Clearance TBD                                Surgeon:  Dr. Dalldorf  Surgeon's Group or Practice Name:  [facility] Phone number:  [phone] Fax number:  [phone]   Type of Clearance Requested:   TBD   Type of Anesthesia:  Spinal   Additional requests/questions:     SignedBarbee DELENA Sharps   10/31/2023, 10:22 AM

## 2023-10-31 NOTE — Telephone Encounter (Signed)
-----   Message from Wyn Raddle, Jackee Shove sent at 10/31/2023 12:36 PM EDT ----- Please contact requesting provider for type of clearance needed.  Currently states TBD for clearance type.  Thanks, Jackee ----- Message ----- From: Wyn Jackee VEAR Raddle., NP Sent: 10/31/2023  12:30 PM EDT To: [facility]

## 2023-11-05 DIAGNOSIS — R972 Elevated prostate specific antigen [PSA]: Secondary | ICD-10-CM | POA: Diagnosis not present

## 2023-11-07 ENCOUNTER — Telehealth: Payer: Self-pay | Admitting: Cardiovascular Disease

## 2023-11-07 NOTE — Telephone Encounter (Signed)
 Caller Lynnetta) is following up on the status of patient's clearance and stated can speak with Dagoberto for the update.

## 2023-11-08 ENCOUNTER — Ambulatory Visit: Attending: Internal Medicine | Admitting: Student

## 2023-11-08 DIAGNOSIS — Z01810 Encounter for preprocedural cardiovascular examination: Secondary | ICD-10-CM

## 2023-11-08 NOTE — Telephone Encounter (Signed)
 S/W Dagoberto from [facility] and gave update that the pt has TELE Preop appt today 11/08/23.  I also informed Dagoberto that ones that appt is complete the provider will update office with the complete clearance or if anything further is needed.

## 2023-11-08 NOTE — Progress Notes (Signed)
 Virtual Visit via Telephone Note   Because of Zachary [name]'s co-morbid illnesses, he is at least at moderate risk for complications without adequate follow up.  This format is felt to be most appropriate for this patient at this time.  The patient did not have access to video technology/had technical difficulties with video requiring transitioning to audio format only (telephone).  All issues noted in this document were discussed and addressed.  No physical exam could be performed with this format.  Please refer to the patient's chart for his consent to telehealth for [facility].  Evaluation Performed:  Preoperative cardiovascular risk assessment _____________   Date:  11/08/2023   Patient ID:  Zachary [name], DOB 21-Feb-1941, MRN [id number] Patient Location:  Home Provider location:   Office  Primary Care Provider:  [facility] Primary Cardiologist:  Dorn Lesches, MD  Chief Complaint / Patient Profile   83 y.o. 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 Dalldorf and presents today for telephonic preoperative cardiovascular risk assessment.  History of Present Illness    Zachary [name] is a 83 y.o. male who presents via audio/video conferencing for a telehealth visit today.  Pt was last seen in cardiology clinic on 12/11/2022 by Dr. Lesches.  At that time Zachary [name] was stable from a cardiac standpoint.  The patient is now pending procedure as outlined above. Since his last visit, he was evaluated in the ED on 09/22/2023 after he developed indigestion at church. His workup was unremarkable and he denies further episodes. Since that evaluation, he has been doing an hour of cardio at the gym most days of the week without limitation. Patient denies shortness of breath, dyspnea on exertion, lower extremity edema, orthopnea or PND. No chest pain, pressure, or tightness.  No palpitations. He does not experience lightheadedness, dizziness, presyncope or syncope.    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   LIMA to LAD, SVG to Diag, SVG to OM1, SVG to RCA, EVH via right thigh and leg   Past Surgical History:  Procedure Laterality Date   CHONDROPLASTY  06/25/2014   Procedure: CHONDROPLASTY;  Surgeon: Maude Herald, MD;  Location: [facility];  Service: Orthopedics;;   CORONARY ARTERY BYPASS GRAFT N/A 08/27/2018   Procedure: CORONARY ARTERY BYPASS GRAFTING (CABG) x four,  using left internal mammary artery and right leg greater saphenous vein harvested endoscopically;  Surgeon: Dusty Sudie DEL, MD;  Location: [facility] OR;  Service: Open Heart Surgery;  Laterality: N/A;   CORONARY ARTERY BYPASS GRAFT N/A 08/27/2018   Procedure: EXPLORATION POST-OP HEART FOR BLEEDING;  Surgeon: Dusty Sudie DEL, MD;  Location: [facility] OR;  Service: Open Heart Surgery;  Laterality: N/A;   Growth removed from nose     age 23 yrs.   KNEE ARTHROSCOPY WITH MEDIAL MENISECTOMY Left 06/25/2014   Procedure: LEFT ARTHROSCOPY KNEE,partial medial menisectomy;  Surgeon: Maude Herald, MD;  Location: [facility];  Service: Orthopedics;  Laterality: Left;   LEFT HEART CATH AND CORONARY ANGIOGRAPHY N/A 08/26/2018   Procedure: LEFT HEART CATH AND CORONARY ANGIOGRAPHY;  Surgeon: Burnard Debby LABOR, MD;  Location: [facility];  Service: Cardiovascular;  Laterality: N/A;   Lower back surgery     Pinched  nerve - 2010   TEE WITHOUT CARDIOVERSION N/A 08/27/2018   Procedure: TRANSESOPHAGEAL ECHOCARDIOGRAM (TEE);  Surgeon: Dusty Sudie DEL, MD;  Location: [facility] OR;  Service: Open Heart Surgery;  Laterality: N/A;   Upper back surgery     Plate and pins    Allergies  No Known Allergies  Home Medications    Prior to Admission medications   Medication Sig Start Date End Date Taking?  Authorizing Provider  acetaminophen  (TYLENOL ) 500 MG tablet Take 500 mg by mouth every 6 (six) hours as needed for headache (pain).     [provider]  amLODipine  (NORVASC ) 10 MG tablet Take 10 mg by mouth daily.    [provider]  aspirin  EC 81 MG tablet Take 81 mg by mouth every other day.    [provider]  atorvastatin  (LIPITOR ) 40 MG tablet Take 1 tablet (40 mg total) by mouth daily. SCHEDULE OFFICE VISIT FOR FUTURE REFILLS 04/03/21   Court Dorn PARAS, MD  chlorthalidone  (HYGROTON ) 25 MG tablet Take 25 mg by mouth daily.    [provider]  losartan  (COZAAR ) 50 MG tablet Take 2 tablets (100 mg total) by mouth daily. 09/02/18   Zimmerman, Donielle M, PA-C  Multiple Vitamin (MULTIVITAMIN WITH MINERALS) TABS tablet Take 1 tablet by mouth daily.    [provider]  mupirocin  cream (BACTROBAN ) 2 % Apply 1 Application topically 2 (two) times daily. 08/10/23   Johnie Rumaldo LABOR, NP    Physical Exam    Vital Signs:  Zachary [name] does not have vital signs available for review today.  Given telephonic nature of communication, physical exam is limited. AAOx3. NAD. Normal affect.  Speech and respirations are unlabored.   Assessment & Plan    Primary Cardiologist: Dorn Court, MD  Preoperative cardiovascular risk assessment.  Right total knee replacement by Dr. Sheril  Chart reviewed as part of pre-operative protocol coverage. According to the RCRI, patient has a 0.9% risk of MACE. Patient reports activity equivalent to 4.0 METS (doing cardio for an hour at the gym most days of the week).   Given past medical history and time since last visit, based on ACC/AHA guidelines, Zachary [name] would be at acceptable risk for the planned procedure without further cardiovascular testing.   Patient was advised that if he develops new symptoms prior to surgery to contact our office to arrange a follow-up appointment.  he verbalized understanding.  Ideally  aspirin  should be continued without interruption, however if the bleeding risk is too great, aspirin  may be held for 5-7 days prior to surgery. Please resume aspirin  post operatively when it is felt to be safe from a bleeding standpoint.      I will route this recommendation to the requesting party via Epic fax function.  Please call with questions.  Time:   Today, I have spent 6 minutes with the patient with telehealth technology discussing medical history, symptoms, and management plan.     Barnie Hila, NP  11/08/2023, 9:53 AM

## 2023-11-19 DIAGNOSIS — M25661 Stiffness of right knee, not elsewhere classified: Secondary | ICD-10-CM | POA: Diagnosis not present

## 2023-11-19 DIAGNOSIS — M1711 Unilateral primary osteoarthritis, right knee: Secondary | ICD-10-CM | POA: Diagnosis not present

## 2023-11-27 ENCOUNTER — Telehealth: Payer: Self-pay

## 2023-11-27 NOTE — Telephone Encounter (Signed)
   Name: Zachary [name]  DOB: 1941-01-04  MRN: [id number]  Primary Cardiologist: Dorn Lesches, MD   Preoperative team, please contact this patient and set up a phone call appointment for further preoperative risk assessment. Please obtain consent and complete medication review. Thank you for your help.  I confirm that guidance regarding antiplatelet and oral anticoagulation therapy has been completed and, if necessary, noted below.  Per office protocol, if patient is without any new symptoms or concerns at the time of their virtual visit, he may hold ASA for 7 days prior to procedure. Please resume Aspirin  as soon as possible postprocedure, at the discretion of the surgeon.    I also confirmed the patient resides in the state of [place] . As per [facility] Medical Board telemedicine laws, the patient must reside in the state in which the provider is licensed.   Lamarr Satterfield, NP 11/27/2023, 4:11 PM [facility]

## 2023-11-27 NOTE — Telephone Encounter (Signed)
   Pre-operative Risk Assessment    Patient Name: Zachary [name]  DOB: 1940/04/14 MRN: [id number]   Date of last office visit: 12/11/23 DORN LESCHES, MD Date of next office visit: NONE   Request for Surgical Clearance    Procedure:  MRI FUSION PROSTATE BIOPSY  Date of Surgery:  Clearance TBD                                Surgeon:  DR MORENE SALINES Surgeon's Group or Practice Name:  [facility] Phone number:  [phone] Fax number:  [phone]   Type of Clearance Requested:   - Medical  - Pharmacy:  Hold Aspirin  5 DAY HOLD   Type of Anesthesia:  Not Indicated   Additional requests/questions:    Signed, Lucie DELENA Ku   11/27/2023, 3:52 PM

## 2023-11-27 NOTE — Telephone Encounter (Signed)
Patient has been scheduled for televisit.

## 2023-11-27 NOTE — Telephone Encounter (Signed)
 Patient has been scheduled for televisit and consent done     Patient Consent for Virtual Visit         Zachary [name] has provided verbal consent on 11/27/2023 for a virtual visit (video or telephone).   CONSENT FOR VIRTUAL VISIT FOR:  Zachary [name]  By participating in this virtual visit I agree to the following:  I hereby voluntarily request, consent and authorize [facility] and its employed or contracted physicians, physician assistants, nurse practitioners or other licensed health care professionals (the Practitioner), to provide me with telemedicine health care services (the "Services) as deemed necessary by the treating Practitioner. I acknowledge and consent to receive the Services by the Practitioner via telemedicine. I understand that the telemedicine visit will involve communicating with the Practitioner through live audiovisual communication technology and the disclosure of certain medical information by electronic transmission. I acknowledge that I have been given the opportunity to request an in-person assessment or other available alternative prior to the telemedicine visit and am voluntarily participating in the telemedicine visit.  I understand that I have the right to withhold or withdraw my consent to the use of telemedicine in the course of my care at any time, without affecting my right to future care or treatment, and that the Practitioner or I may terminate the telemedicine visit at any time. I understand that I have the right to inspect all information obtained and/or recorded in the course of the telemedicine visit and may receive copies of available information for a reasonable fee.  I understand that some of the potential risks of receiving the Services via telemedicine include:  Delay or interruption in medical evaluation due to technological equipment failure or disruption; Information transmitted may not be sufficient (e.g. poor resolution of images) to allow  for appropriate medical decision making by the Practitioner; and/or  In rare instances, security protocols could fail, causing a breach of personal health information.  Furthermore, I acknowledge that it is my responsibility to provide information about my medical history, conditions and care that is complete and accurate to the best of my ability. I acknowledge that Practitioner's advice, recommendations, and/or decision may be based on factors not within their control, such as incomplete or inaccurate data provided by me or distortions of diagnostic images or specimens that may result from electronic transmissions. I understand that the practice of medicine is not an exact science and that Practitioner makes no warranties or guarantees regarding treatment outcomes. I acknowledge that a copy of this consent can be made available to me via my patient portal [facility] MyChart), or I can request a printed copy by calling the office of [facility].    I understand that my insurance will be billed for this visit.   I have read or had this consent read to me. I understand the contents of this consent, which adequately explains the benefits and risks of the Services being provided via telemedicine.  I have been provided ample opportunity to ask questions regarding this consent and the Services and have had my questions answered to my satisfaction. I give my informed consent for the services to be provided through the use of telemedicine in my medical care

## 2023-12-03 ENCOUNTER — Telehealth

## 2023-12-03 NOTE — Telephone Encounter (Signed)
   Primary Cardiologist: Dorn Lesches, MD  Chart reviewed as part of pre-operative protocol coverage. Given past medical history and time since last visit, based on ACC/AHA guidelines, Zachary [name] would be at acceptable risk for the planned procedure without further cardiovascular testing.   Patient should contact our office if he is having new symptoms that are concerning from a cardiac perspective to arrange a follow-up appointment.    Per office protocol, he may hold ASA for 7 days prior to procedure. Please resume Aspirin  as soon as possible postprocedure, at the discretion of the surgeon.   I will route this recommendation to the requesting party via Epic fax function and remove from pre-op pool.  Please call with questions.  Rosaline EMERSON Bane, NP-C 12/03/2023, 8:57 AM [address] [place], [place] [address] Office [phone] Fax [phone]

## 2023-12-03 NOTE — Telephone Encounter (Signed)
 I reached out to the pt per preop APP Rosaline Bane, NP: Pt is on schedule today for 10:00, however he just had preop phone call 9/5 for knee surgery so today is not needed unless he is having new symptoms (procedure is MRI prostate biopsy). Once you speak with him, let me know and I will route clearance    Me: pt states he is doing well and has even started going to the gym now since the knee surgery. no new complaints. I informed the pt that I will cancel the tele and you will update notes and send to surgeon office per your notes.

## 2023-12-05 ENCOUNTER — Other Ambulatory Visit: Payer: Self-pay | Admitting: Orthopaedic Surgery

## 2023-12-05 DIAGNOSIS — S81801A Unspecified open wound, right lower leg, initial encounter: Secondary | ICD-10-CM | POA: Diagnosis not present

## 2023-12-05 NOTE — Care Plan (Signed)
 Ortho Bundle Case Management Note  Patient Details  Name: Zachary [name] MRN: [id number] Date of Birth: 06/29/1940  spoke with patient he will discharge to his home or the home of his daughter. has DME. HHPT referral to [facility] and OPPT set up with [facility] discharge instructions mailed and discussed. questions answered. Patient and MD in agreement with plan. Choice offered.                   DME Arranged:    DME Agency:     HH Arranged:  PT HH Agency:  [facility] ([facility]  Additional Comments: Please contact me with any questions of if this plan should need to change.  Charlies Pitch,  RN,BSN,MHA,CCM  [facility]  [phone] 12/05/2023, 1:37 PM

## 2023-12-06 NOTE — Progress Notes (Signed)
 Sent message, via epic in basket, requesting orders in epic from Careers adviser.

## 2023-12-12 NOTE — Progress Notes (Signed)
 COVID Vaccine received:  []  No [x]  Yes Date of any COVID positive Test in last 90 days: no PCP - [facility]- Trula Brim MD Cardiologist - Dr. Ethel Lesches  Chest x-ray - 09/22/23 Epic EKG -  09/23/23 Epic Stress Test -  ECHO - 08/27/18 Epic Cardiac Cath - 08/26/18 Epic  Cardiac clearance 11/08/23- Barnie Hila NP  Bowel Prep - [x]  No  []   Yes ______  Pacemaker / ICD device [x]  No []  Yes   Spinal Cord Stimulator:[x]  No []  Yes       History of Sleep Apnea? [x]  No []  Yes   CPAP used?- [x]  No []  Yes    Does the patient monitor blood sugar?          [x]  No []  Yes  []  N/A  Patient has: [x]  NO Hx DM   []  Pre-DM                 []  DM1  []   DM2 Does patient have a [employer] or [employer]? []  No []  Yes   Fasting Blood Sugar Ranges-  Checks Blood Sugar _____ times a day  GLP1 agonist / usual dose - no GLP1 instructions:  SGLT-2 inhibitors / usual dose - no SGLT-2 instructions:   Blood Thinner / Instructions:no Aspirin  Instructions:ASA 81mg  Last day was 12/09/23  Comments:   Activity level: Patient is able  a flight of stairs without difficulty; [x]  No CP  [x]  No SOB,     Patient can   perform ADLs without assistance.   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  Patient verbalized understanding and agreement to the Pre-Surgical Instructions that were given to them at this PAT appointment. Patient was also educated of the need to review these PAT instructions again prior to his/her surgery.I reviewed the appropriate phone numbers to call if they have any and questions or concerns.

## 2023-12-12 NOTE — Patient Instructions (Signed)
 SURGICAL WAITING ROOM VISITATION  Patients having surgery or a procedure may have no more than 2 support people in the waiting area - these visitors may rotate.    Children under the age of 40 must have an adult with them who is not the patient.  Visitors with respiratory illnesses are discouraged from visiting and should remain at home.  If the patient needs to stay at the hospital during part of their recovery, the visitor guidelines for inpatient rooms apply. Pre-op nurse will coordinate an appropriate time for 1 support person to accompany patient in pre-op.  This support person may not rotate.    Please refer to the [facility] website for the visitor guidelines for Inpatients (after your surgery is over and you are in a regular room).       Your procedure is scheduled on: 12/17/23   Report to [facility] Main Entrance    Report to admitting at 12:10 PM   Call this number if you have problems the morning of surgery [phone]   Do not eat food :After Midnight.   After Midnight you may have the following liquids until 11:40 AM DAY OF SURGERY  Water Non-Citrus Juices (without pulp, NO RED-Apple, White grape, White cranberry) Black Coffee (NO MILK/CREAM OR CREAMERS, sugar ok)  Clear Tea (NO MILK/CREAM OR CREAMERS, sugar ok) regular and decaf                             Plain Jell-O (NO RED)                                           Fruit ices (not with fruit pulp, NO RED)                                     Popsicles (NO RED)                                                               Sports drinks like Gatorade (NO RED)                   The day of surgery:  Drink ONE (1) Pre-Surgery Clear Ensure at 11:40 AM the morning of surgery. Drink in one sitting. Do not sip.  This drink was given to you during your hospital  pre-op appointment visit. Nothing else to drink after completing the  Pre-Surgery Clear Ensure      Oral Hygiene is also important to reduce  your risk of infection.                                    Remember - BRUSH YOUR TEETH THE MORNING OF SURGERY WITH YOUR REGULAR TOOTHPASTE  DENTURES WILL BE REMOVED PRIOR TO SURGERY PLEASE DO NOT APPLY Poly grip OR ADHESIVES!!!     Stop all vitamins and herbal supplements 7 days before surgery.   Take these medicines the morning of surgery with A SIP OF WATER:  tylenol , amlodipine , chlorthalidone              You may not have any metal on your body including hair pins, jewelry, and body piercing             Do not wear make-up, lotions, powders, perfumes/cologne, or deodorant               Men may shave face and neck.   Do not bring valuables to the hospital.  IS NOT             RESPONSIBLE   FOR VALUABLES.   Contacts, glasses, dentures or bridgework may not be worn into surgery.    DO NOT BRING YOUR HOME MEDICATIONS TO THE HOSPITAL. PHARMACY WILL DISPENSE MEDICATIONS LISTED ON YOUR MEDICATION LIST TO YOU DURING YOUR ADMISSION IN THE HOSPITAL!    Patients discharged on the day of surgery will not be allowed to drive home.  Someone NEEDS to stay with you for the first 24 hours after anesthesia.   Special Instructions: Bring a copy of your healthcare power of attorney and living will documents the day of surgery if you haven't scanned them before.              Please read over the following fact sheets you were given: IF YOU HAVE QUESTIONS ABOUT YOUR PRE-OP INSTRUCTIONS PLEASE CALL [phone] Zachary [name]   If you received a COVID test during your pre-op visit  it is requested that you wear a mask when out in public, stay away from anyone that may not be feeling well and notify your surgeon if you develop symptoms. If you test positive for Covid or have been in contact with anyone that has tested positive in the last 10 days please notify you surgeon.      Pre-operative 4 CHG Bath Instructions  DYNA-Hex 4 Chlorhexidine  Gluconate 4% Solution Antiseptic 4 fl. oz   You can play  a key role in reducing the risk of infection after surgery. Your skin needs to be as free of germs as possible. You can reduce the number of germs on your skin by washing with CHG (chlorhexidine  gluconate) soap before surgery. CHG is an antiseptic soap that kills germs and continues to kill germs even after washing.   DO NOT use if you have an allergy to chlorhexidine /CHG or antibacterial soaps. If your skin becomes reddened or irritated, stop using the CHG and notify one of our RNs at   Please shower with the CHG soap starting 4 days before surgery using the following schedule:     Please keep in mind the following:  DO NOT shave, including legs and underarms, starting the day of your first shower.   You may shave your face at any point before/day of surgery.  Place clean sheets on your bed the day you start using CHG soap. Use a clean washcloth (not used since being washed) for each shower. DO NOT sleep with pets once you start using the CHG.  CHG Shower Instructions:  If you choose to wash your hair and private area, wash first with your normal shampoo/soap.  After you use shampoo/soap, rinse your hair and body thoroughly to remove shampoo/soap residue.  Turn the water OFF and apply about 3 tablespoons (45 ml) of CHG soap to a CLEAN washcloth.  Apply CHG soap ONLY FROM YOUR NECK DOWN TO YOUR TOES (washing for 3-5 minutes)  DO NOT use CHG soap on face, private areas, open wounds, or sores.  Pay special attention to the area where your surgery is being performed.  If you are having back surgery, having someone wash your back for you may be helpful. Wait 2 minutes after CHG soap is applied, then you may rinse off the CHG soap.  Pat dry with a clean towel  Put on clean clothes/pajamas   If you choose to wear lotion, please use ONLY the CHG-compatible lotions on the back of this paper.     Additional instructions for the day of surgery: DO NOT APPLY any lotions, deodorants, cologne, or  perfumes.   Put on clean/comfortable clothes.  Brush your teeth.  Ask your nurse before applying any prescription medications to the skin.   CHG Compatible Lotions   Aveeno Moisturizing lotion  Cetaphil Moisturizing Cream  Cetaphil Moisturizing Lotion  Clairol Herbal Essence Moisturizing Lotion, Dry Skin  Clairol Herbal Essence Moisturizing Lotion, Extra Dry Skin  Clairol Herbal Essence Moisturizing Lotion, Normal Skin  Curel Age Defying Therapeutic Moisturizing Lotion with Alpha Hydroxy  Curel Extreme Care Body Lotion  Curel Soothing Hands Moisturizing Hand Lotion  Curel Therapeutic Moisturizing Cream, Fragrance-Free  Curel Therapeutic Moisturizing Lotion, Fragrance-Free  Curel Therapeutic Moisturizing Lotion, Original Formula  Eucerin Daily Replenishing Lotion  Eucerin Dry Skin Therapy Plus Alpha Hydroxy Crme  Eucerin Dry Skin Therapy Plus Alpha Hydroxy Lotion  Eucerin Original Crme  Eucerin Original Lotion  Eucerin Plus Crme Eucerin Plus Lotion  Eucerin TriLipid Replenishing Lotion  Keri Anti-Bacterial Hand Lotion  Keri Deep Conditioning Original Lotion Dry Skin Formula Softly Scented  Keri Deep Conditioning Original Lotion, Fragrance Free Sensitive Skin Formula  Keri Lotion Fast Absorbing Fragrance Free Sensitive Skin Formula  Keri Lotion Fast Absorbing Softly Scented Dry Skin Formula  Keri Original Lotion  Keri Skin Renewal Lotion Keri Silky Smooth Lotion  Keri Silky Smooth Sensitive Skin Lotion  Nivea Body Creamy Conditioning Oil  Nivea Body Extra Enriched Lotion  Nivea Body Original Lotion  Nivea Body Sheer Moisturizing Lotion Nivea Crme  Nivea Skin Firming Lotion  NutraDerm 30 Skin Lotion  NutraDerm Skin Lotion  NutraDerm Therapeutic Skin Cream  NutraDerm Therapeutic Skin Lotion  ProShield Protective Hand Cream Incentive Spirometer  An incentive spirometer is a tool that can help keep your lungs clear and active. This tool measures how well you are filling  your lungs with each breath. Taking long deep breaths may help reverse or decrease the chance of developing breathing (pulmonary) problems (especially infection) following: A long period of time when you are unable to move or be active. BEFORE THE PROCEDURE  If the spirometer includes an indicator to show your best effort, your nurse or respiratory therapist will set it to a desired goal. If possible, sit up straight or lean slightly forward. Try not to slouch. Hold the incentive spirometer in an upright position. INSTRUCTIONS FOR USE  Sit on the edge of your bed if possible, or sit up as far as you can in bed or on a chair. Hold the incentive spirometer in an upright position. Breathe out normally. Place the mouthpiece in your mouth and seal your lips tightly around it. Breathe in slowly and as deeply as possible, raising the piston or the ball toward the top of the column. Hold your breath for 3-5 seconds or for as long as possible. Allow the piston or ball to fall to the bottom of the column. Remove the mouthpiece from your mouth and breathe out normally. Rest for a few seconds and repeat Steps 1 through 7 at  least 10 times every 1-2 hours when you are awake. Take your time and take a few normal breaths between deep breaths. The spirometer may include an indicator to show your best effort. Use the indicator as a goal to work toward during each repetition. After each set of 10 deep breaths, practice coughing to be sure your lungs are clear. If you have an incision (the cut made at the time of surgery), support your incision when coughing by placing a pillow or rolled up towels firmly against it. Once you are able to get out of bed, walk around indoors and cough well. You may stop using the incentive spirometer when instructed by your caregiver.  RISKS AND COMPLICATIONS Take your time so you do not get dizzy or light-headed. If you are in pain, you may need to take or ask for pain medication  before doing incentive spirometry. It is harder to take a deep breath if you are having pain. AFTER USE Rest and breathe slowly and easily. It can be helpful to keep track of a log of your progress. Your caregiver can provide you with a simple table to help with this. If you are using the spirometer at home, follow these instructions: SEEK MEDICAL CARE IF:  You are having difficultly using the spirometer. You have trouble using the spirometer as often as instructed. Your pain medication is not giving enough relief while using the spirometer. You develop fever of 100.5 F (38.1 C) or higher. SEEK IMMEDIATE MEDICAL CARE IF:  You cough up bloody sputum that had not been present before. You develop fever of 102 F (38.9 C) or greater. You develop worsening pain at or near the incision site. MAKE SURE YOU:  Understand these instructions. Will watch your condition. Will get help right away if you are not doing well or get worse. Document Released: 07/02/2006 Document Revised: 05/14/2011 Document Reviewed: 09/02/2006 [facility] Patient Information 2014 [place], [place].

## 2023-12-13 ENCOUNTER — Encounter (HOSPITAL_COMMUNITY)
Admission: RE | Admit: 2023-12-13 | Discharge: 2023-12-13 | Disposition: A | Discharge status: Home / Self Care | Source: Ambulatory Visit | Attending: Orthopaedic Surgery | Admitting: Orthopaedic Surgery

## 2023-12-13 ENCOUNTER — Encounter (HOSPITAL_COMMUNITY): Payer: Self-pay

## 2023-12-13 ENCOUNTER — Other Ambulatory Visit: Payer: Self-pay

## 2023-12-13 VITALS — BP 129/61 | HR 52 | Temp 97.5°F | Resp 16 | Ht 62.5 in | Wt 130.0 lb

## 2023-12-13 DIAGNOSIS — I471 Supraventricular tachycardia, unspecified: Secondary | ICD-10-CM | POA: Diagnosis not present

## 2023-12-13 DIAGNOSIS — Z87891 Personal history of nicotine dependence: Secondary | ICD-10-CM | POA: Insufficient documentation

## 2023-12-13 DIAGNOSIS — Z01812 Encounter for preprocedural laboratory examination: Secondary | ICD-10-CM | POA: Diagnosis not present

## 2023-12-13 DIAGNOSIS — I10 Essential (primary) hypertension: Secondary | ICD-10-CM | POA: Insufficient documentation

## 2023-12-13 DIAGNOSIS — M1711 Unilateral primary osteoarthritis, right knee: Secondary | ICD-10-CM | POA: Diagnosis not present

## 2023-12-13 DIAGNOSIS — I4510 Unspecified right bundle-branch block: Secondary | ICD-10-CM | POA: Diagnosis not present

## 2023-12-13 DIAGNOSIS — I2510 Atherosclerotic heart disease of native coronary artery without angina pectoris: Secondary | ICD-10-CM | POA: Diagnosis not present

## 2023-12-13 DIAGNOSIS — Z01818 Encounter for other preprocedural examination: Secondary | ICD-10-CM

## 2023-12-13 DIAGNOSIS — Z951 Presence of aortocoronary bypass graft: Secondary | ICD-10-CM | POA: Insufficient documentation

## 2023-12-13 HISTORY — DX: Atherosclerotic heart disease of native coronary artery without angina pectoris: I25.10

## 2023-12-13 LAB — BASIC METABOLIC PANEL WITH GFR
Anion gap: 8 (ref 5–15)
BUN: 21 mg/dL (ref 8–23)
CO2: 28 mmol/L (ref 22–32)
Calcium: 9.7 mg/dL (ref 8.9–10.3)
Chloride: 103 mmol/L (ref 98–111)
Creatinine, Ser: 1.19 mg/dL (ref 0.61–1.24)
GFR, Estimated: >60 mL/min (ref >60)
Glucose, Bld: 112 mg/dL — ABNORMAL HIGH (ref 70–99)
Potassium: 3.8 mmol/L (ref 3.5–5.1)
Sodium: 140 mmol/L (ref 135–145)

## 2023-12-13 LAB — CBC
HCT: 41.1 % (ref 39.0–52.0)
Hemoglobin: 13.7 g/dL (ref 13.0–17.0)
MCH: 31.8 pg (ref 26.0–34.0)
MCHC: 33.3 g/dL (ref 30.0–36.0)
MCV: 95.4 fL (ref 80.0–100.0)
Platelets: 259 K/uL (ref 150–400)
RBC: 4.31 MIL/uL (ref 4.22–5.81)
RDW: 12.5 % (ref 11.5–15.5)
WBC: 6.7 K/uL (ref 4.0–10.5)
nRBC: 0 % (ref 0.0–0.2)

## 2023-12-13 LAB — SURGICAL PCR SCREEN
MRSA, PCR: NEGATIVE
Staphylococcus aureus: NEGATIVE

## 2023-12-14 NOTE — H&P (Signed)
 TOTAL KNEE ADMISSION H&P  Patient is being admitted for right total knee arthroplasty.  Subjective:  Chief Complaint:right knee pain.  HPI: Zachary [name], 83 y.o. male, has a history of pain and functional disability in the right knee due to arthritis and has failed non-surgical conservative treatments for greater than 12 weeks to includeNSAID's and/or analgesics, corticosteriod injections, viscosupplementation injections, flexibility and strengthening excercises, supervised PT with diminished ADL's post treatment, use of assistive devices, weight reduction as appropriate, and activity modification.  Onset of symptoms was gradual, starting 5 years ago with gradually worsening course since that time. The patient noted no past surgery on the right knee(s).  Patient currently rates pain in the right knee(s) at 10 out of 10 with activity. Patient has night pain, worsening of pain with activity and weight bearing, pain that interferes with activities of daily living, crepitus, and joint swelling.  Patient has evidence of subchondral cysts, subchondral sclerosis, periarticular osteophytes, and joint space narrowing by imaging studies. There is no active infection.  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   LIMA to LAD, SVG to Diag, SVG to OM1, SVG to RCA, EVH via right thigh and leg    Past Surgical History:  Procedure Laterality Date   CHONDROPLASTY  06/25/2014   Procedure: CHONDROPLASTY;  Surgeon: Maude Herald, MD;  Location: [facility];  Service: Orthopedics;;   CORONARY  ARTERY BYPASS GRAFT N/A 08/27/2018   Procedure: CORONARY ARTERY BYPASS GRAFTING (CABG) x four,  using left internal mammary artery and right leg greater saphenous vein harvested endoscopically;  Surgeon: Dusty Sudie DEL, MD;  Location: [facility] OR;  Service: Open Heart Surgery;  Laterality: N/A;   CORONARY ARTERY BYPASS GRAFT N/A 08/27/2018   Procedure: EXPLORATION POST-OP HEART FOR BLEEDING;  Surgeon: Dusty Sudie DEL, MD;  Location: [facility] OR;  Service: Open Heart Surgery;  Laterality: N/A;   Growth removed from nose     age 7 yrs.   KNEE ARTHROSCOPY WITH MEDIAL MENISECTOMY Left 06/25/2014   Procedure: LEFT ARTHROSCOPY KNEE,partial medial menisectomy;  Surgeon: Maude Herald, MD;  Location: [facility];  Service: Orthopedics;  Laterality: Left;   LEFT HEART CATH AND CORONARY ANGIOGRAPHY N/A 08/26/2018   Procedure: LEFT HEART CATH AND CORONARY ANGIOGRAPHY;  Surgeon: Burnard Debby LABOR, MD;  Location: [facility] INVASIVE [facility];  Service: Cardiovascular;  Laterality: N/A;   Lower back surgery     Pinched nerve - 2010   TEE WITHOUT CARDIOVERSION N/A 08/27/2018   Procedure: TRANSESOPHAGEAL ECHOCARDIOGRAM (TEE);  Surgeon: Dusty Sudie DEL, MD;  Location: [facility] OR;  Service: Open Heart Surgery;  Laterality: N/A;   Upper back surgery     Plate and pins    No current facility-administered medications for this encounter.   Current Outpatient Medications  Medication Sig Dispense Refill Last Dose/Taking   amLODipine  (NORVASC ) 10 MG tablet Take 10 mg by mouth daily.   Taking   atorvastatin  (LIPITOR ) 40 MG tablet Take 1 tablet (40 mg total) by mouth daily. SCHEDULE OFFICE VISIT FOR FUTURE REFILLS (Patient taking differently: Take 40 mg  by mouth at bedtime.) 30 tablet 0 Taking Differently   chlorthalidone  (HYGROTON ) 25 MG tablet Take 25 mg by mouth daily.   Taking   hydrOXYzine (ATARAX) 25 MG tablet Take 25 mg by mouth 3 (three) times daily as needed for itching.   Taking As Needed   losartan  (COZAAR ) 100 MG tablet  Take 100 mg by mouth daily.   Taking   acetaminophen  (TYLENOL ) 500 MG tablet Take 500 mg by mouth every 6 (six) hours as needed for headache (pain).       aspirin  EC 81 MG tablet Take 81 mg by mouth every other day.      losartan  (COZAAR ) 50 MG tablet Take 2 tablets (100 mg total) by mouth daily. (Patient not taking: Reported on 12/10/2023) 30 tablet 1 Not Taking   Multiple Vitamin (MULTIVITAMIN WITH MINERALS) TABS tablet Take 1 tablet by mouth daily.      mupirocin  cream (BACTROBAN ) 2 % Apply 1 Application topically 2 (two) times daily. (Patient not taking: Reported on 12/10/2023) 15 g 0 Not Taking   Allergies  Allergen Reactions   Aspirin      Other Reaction(s): dyspepsia in high doses   Codeine Other (See Comments)   Metoprolol  Diarrhea   Thiazide-Type Diuretics     Other Reaction(s): High doses of 25mg  - gout & mild neuropathy    Social History   Tobacco Use   Smoking status: Former    Types: Cigarettes   Smokeless tobacco: Never   Tobacco comments:    Quit smoking 50 years ago  Substance Use Topics   Alcohol use: No    Family History  Problem Relation Age of Onset   Heart attack Father 49     Review of Systems  Musculoskeletal:  Positive for arthralgias.       Right knee  All other systems reviewed and are negative.   Objective:  Physical Exam Constitutional:      Appearance: Normal appearance.  HENT:     Head: Normocephalic and atraumatic.     Nose: Nose normal.     Mouth/Throat:     Pharynx: Oropharynx is clear.  Eyes:     Extraocular Movements: Extraocular movements intact.  Pulmonary:     Effort: Pulmonary effort is normal.  Abdominal:     Palpations: Abdomen is soft.  Musculoskeletal:     Cervical back: Normal range of motion.     Comments: Right knee exam is the same with motion from 0-130.  He has crepitation and medial joint line pain.  Hip motion is full.  His wounds are all healed on the shin.   Skin:    General: Skin is warm and dry.   Neurological:     General: No focal deficit present.     Mental Status: He is alert and oriented to person, place, and time. Mental status is at baseline.  Psychiatric:        Mood and Affect: Mood normal.        Behavior: Behavior normal.        Thought Content: Thought content normal.        Judgment: Judgment normal.     Vital signs in last 24 hours:    Labs:   Estimated body mass index is 23.4 kg/m as calculated from the following:   Height as of 12/13/23: 5' 2.5 (1.588 m).   Weight as of 12/13/23: 59 kg.   Imaging Review Plain radiographs demonstrate severe degenerative joint disease of the right knee(s). The  overall alignment isneutral. The bone quality appears to be good for age and reported activity level.      Assessment/Plan:  End stage primary arthritis, right knee   The patient history, physical examination, clinical judgment of the provider and imaging studies are consistent with end stage degenerative joint disease of the right knee(s) and total knee arthroplasty is deemed medically necessary. The treatment options including medical management, injection therapy arthroscopy and arthroplasty were discussed at length. The risks and benefits of total knee arthroplasty were presented and reviewed. The risks due to aseptic loosening, infection, stiffness, patella tracking problems, thromboembolic complications and other imponderables were discussed. The patient acknowledged the explanation, agreed to proceed with the plan and consent was signed. Patient is being admitted for inpatient treatment for surgery, pain control, PT, OT, prophylactic antibiotics, VTE prophylaxis, progressive ambulation and ADL's and discharge planning. The patient is planning to be discharged home with home health services     Patient's anticipated LOS is less than 2 midnights, meeting these requirements: - Younger than 34 - Lives within 1 hour of care - Has a competent adult at home to  recover with post-op recover - NO history of  - Chronic pain requiring opiods  - Diabetes  - Coronary Artery Disease  - Heart failure  - Heart attack  - Stroke  - DVT/VTE  - Cardiac arrhythmia  - Respiratory Failure/COPD  - Renal failure  - Anemia  - Advanced Liver disease

## 2023-12-16 NOTE — Progress Notes (Signed)
 Anesthesia Chart Review   Case: [id number] Date/Time: 12/17/23 1158   Procedure: ARTHROPLASTY, KNEE, TOTAL (Right: Knee)   Anesthesia type: Spinal   Pre-op diagnosis: RIGHT KNEE DEGENERATIVE JOINT DISEASE   Location: [facility] ROOM 06 / [facility]   Surgeons: Sheril Coy, MD       DISCUSSION:83 y.o. former smoker with h/o HTN, CAD s/p CABG 2020, PSVT, RBBB, right knee djd scheduled for above procedure 12/17/2023 with Dr. Coy Sheril.   Per cardiology preoperative evaluation 12/03/2023, Chart reviewed as part of pre-operative protocol coverage. Given past medical history and time since last visit, based on ACC/AHA guidelines, Zachary [name] would be at acceptable risk for the planned procedure without further cardiovascular testing.    Patient should contact our office if he is having new symptoms that are concerning from a cardiac perspective to arrange a follow-up appointment.     Per office protocol, he may hold ASA for 7 days prior to procedure. Please resume Aspirin  as soon as possible postprocedure, at the discretion of the surgeon.  VS: BP 129/61   Pulse (!) 52   Temp (!) 36.4 C (Oral)   Resp 16   Ht 5' 2.5 (1.588 m)   Wt 59 kg   SpO2 98%   BMI 23.40 kg/m   PROVIDERS: [facility]  Primary Cardiologist: Dorn Lesches, MD  LABS: Labs reviewed: Acceptable for surgery. (all labs ordered are listed, but only abnormal results are displayed)  Labs Reviewed  BASIC METABOLIC PANEL WITH GFR - Abnormal; Notable for the following components:      Result Value   Glucose, Bld 112 (*)    All other components within normal limits  SURGICAL PCR SCREEN  CBC     IMAGES:   EKG:   CV: Echo 08/26/2018  1. Mild hypokinesis of the left ventricular, basal inferolateral wall.   2. The left ventricle has normal systolic function, with an ejection  fraction of 55-60%. The cavity size was normal. There is moderately  increased left ventricular wall thickness.  Left ventricular diastolic  parameters were normal.   3. The right ventricle has normal systolic function. The cavity was  normal. There is no increase in right ventricular wall thickness. Right  ventricular systolic pressure could not be assessed.   4. The pericardium was not assessed.   5. No evidence of mitral valve stenosis.   6. The aortic valve is tricuspid. No stenosis of the aortic valve.   7. The aortic root and ascending aorta are normal in size and structure.   8. The interatrial septum was not well visualized.  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   LIMA to LAD, SVG to Diag, SVG to OM1, SVG to RCA, EVH via right thigh and leg    Past Surgical History:  Procedure Laterality Date   CHONDROPLASTY  06/25/2014   Procedure: CHONDROPLASTY;  Surgeon: Coy Sheril, MD;  Location: [facility];  Service: Orthopedics;;   CORONARY ARTERY BYPASS GRAFT N/A 08/27/2018   Procedure: CORONARY ARTERY BYPASS GRAFTING (CABG) x four,  using left internal mammary artery and right leg greater saphenous vein harvested endoscopically;  Surgeon: Dusty Sudie DEL, MD;  Location: [facility] OR;  Service: Open Heart Surgery;  Laterality: N/A;   CORONARY ARTERY BYPASS GRAFT N/A 08/27/2018   Procedure: EXPLORATION POST-OP HEART FOR BLEEDING;  Surgeon: Dusty Sudie  H, MD;  Location: [facility] OR;  Service: Open Heart Surgery;  Laterality: N/A;   Growth removed from nose     age 61 yrs.   KNEE ARTHROSCOPY WITH MEDIAL MENISECTOMY Left 06/25/2014   Procedure: LEFT ARTHROSCOPY KNEE,partial medial menisectomy;  Surgeon: Maude Herald, MD;  Location: [facility];  Service: Orthopedics;  Laterality: Left;   LEFT HEART CATH AND CORONARY ANGIOGRAPHY N/A 08/26/2018   Procedure: LEFT HEART CATH AND CORONARY ANGIOGRAPHY;  Surgeon: Burnard Debby LABOR, MD;  Location: [facility];  Service:  Cardiovascular;  Laterality: N/A;   Lower back surgery     Pinched nerve - 2010   TEE WITHOUT CARDIOVERSION N/A 08/27/2018   Procedure: TRANSESOPHAGEAL ECHOCARDIOGRAM (TEE);  Surgeon: Dusty Sudie DEL, MD;  Location: [facility] OR;  Service: Open Heart Surgery;  Laterality: N/A;   Upper back surgery     Plate and pins    MEDICATIONS:  acetaminophen  (TYLENOL ) 500 MG tablet   amLODipine  (NORVASC ) 10 MG tablet   aspirin  EC 81 MG tablet   atorvastatin  (LIPITOR ) 40 MG tablet   chlorthalidone  (HYGROTON ) 25 MG tablet   hydrOXYzine (ATARAX) 25 MG tablet   losartan  (COZAAR ) 100 MG tablet   losartan  (COZAAR ) 50 MG tablet   Multiple Vitamin (MULTIVITAMIN WITH MINERALS) TABS tablet   mupirocin  cream (BACTROBAN ) 2 %   No current facility-administered medications for this encounter.    Zachary Hoots Ward, PA-C [facility] Pre-Surgical Testing [phone]

## 2023-12-16 NOTE — Anesthesia Preprocedure Evaluation (Signed)
 Anesthesia Evaluation    Airway        Dental   Pulmonary former smoker          Cardiovascular hypertension,      Neuro/Psych    GI/Hepatic   Endo/Other    Renal/GU      Musculoskeletal   Abdominal   Peds  Hematology   Anesthesia Other Findings   Reproductive/Obstetrics                              Anesthesia Physical Anesthesia Plan  ASA:   Anesthesia Plan:    Post-op Pain Management:    Induction:   PONV Risk Score and Plan:   Airway Management Planned:   Additional Equipment:   Intra-op Plan:   Post-operative Plan:   Informed Consent:   Plan Discussed with:   Anesthesia Plan Comments: (See PAT note 12/13/2023)        Anesthesia Quick Evaluation

## 2023-12-17 ENCOUNTER — Ambulatory Visit (HOSPITAL_BASED_OUTPATIENT_CLINIC_OR_DEPARTMENT_OTHER): Payer: Self-pay | Admitting: Anesthesiology

## 2023-12-17 ENCOUNTER — Encounter (HOSPITAL_COMMUNITY)
Admission: RE | Disposition: A | Discharge status: Home / Self Care | Payer: Self-pay | Source: Home / Self Care | Attending: Orthopaedic Surgery

## 2023-12-17 ENCOUNTER — Ambulatory Visit (HOSPITAL_COMMUNITY)
Admission: RE | Admit: 2023-12-17 | Discharge: 2023-12-17 | Disposition: A | Discharge status: Home / Self Care | Attending: Orthopaedic Surgery | Admitting: Orthopaedic Surgery

## 2023-12-17 ENCOUNTER — Encounter (HOSPITAL_COMMUNITY): Payer: Self-pay | Admitting: Orthopaedic Surgery

## 2023-12-17 ENCOUNTER — Other Ambulatory Visit: Payer: Self-pay

## 2023-12-17 ENCOUNTER — Ambulatory Visit (HOSPITAL_COMMUNITY): Payer: Self-pay | Admitting: Physician Assistant

## 2023-12-17 DIAGNOSIS — Z87891 Personal history of nicotine dependence: Secondary | ICD-10-CM | POA: Diagnosis not present

## 2023-12-17 DIAGNOSIS — Z7982 Long term (current) use of aspirin: Secondary | ICD-10-CM | POA: Diagnosis not present

## 2023-12-17 DIAGNOSIS — I2510 Atherosclerotic heart disease of native coronary artery without angina pectoris: Secondary | ICD-10-CM | POA: Diagnosis not present

## 2023-12-17 DIAGNOSIS — I252 Old myocardial infarction: Secondary | ICD-10-CM | POA: Insufficient documentation

## 2023-12-17 DIAGNOSIS — E785 Hyperlipidemia, unspecified: Secondary | ICD-10-CM | POA: Insufficient documentation

## 2023-12-17 DIAGNOSIS — I471 Supraventricular tachycardia, unspecified: Secondary | ICD-10-CM | POA: Insufficient documentation

## 2023-12-17 DIAGNOSIS — G8918 Other acute postprocedural pain: Secondary | ICD-10-CM | POA: Diagnosis not present

## 2023-12-17 DIAGNOSIS — I10 Essential (primary) hypertension: Secondary | ICD-10-CM | POA: Diagnosis not present

## 2023-12-17 DIAGNOSIS — M1711 Unilateral primary osteoarthritis, right knee: Secondary | ICD-10-CM | POA: Insufficient documentation

## 2023-12-17 DIAGNOSIS — M2550 Pain in unspecified joint: Secondary | ICD-10-CM | POA: Insufficient documentation

## 2023-12-17 DIAGNOSIS — I4510 Unspecified right bundle-branch block: Secondary | ICD-10-CM | POA: Diagnosis not present

## 2023-12-17 DIAGNOSIS — Z951 Presence of aortocoronary bypass graft: Secondary | ICD-10-CM | POA: Insufficient documentation

## 2023-12-17 DIAGNOSIS — Z79899 Other long term (current) drug therapy: Secondary | ICD-10-CM | POA: Insufficient documentation

## 2023-12-17 HISTORY — PX: TOTAL KNEE ARTHROPLASTY: SHX125

## 2023-12-17 SURGERY — ARTHROPLASTY, KNEE, TOTAL
Anesthesia: Spinal | Site: Knee | Laterality: Right

## 2023-12-17 MED ORDER — FENTANYL CITRATE (PF) 250 MCG/5ML IJ SOLN
INTRAMUSCULAR | Status: AC
  Filled 2023-12-17: qty 5

## 2023-12-17 MED ORDER — CHLORHEXIDINE GLUCONATE 0.12 % MT SOLN
15.0000 mL | Freq: Once | OROMUCOSAL | Status: AC
  Administered 2023-12-17: 15 mL via OROMUCOSAL

## 2023-12-17 MED ORDER — SODIUM CHLORIDE (PF) 0.9 % IJ SOLN
INTRAMUSCULAR | Status: AC
  Filled 2023-12-17: qty 30

## 2023-12-17 MED ORDER — DEXAMETHASONE SOD PHOSPHATE PF 10 MG/ML IJ SOLN
INTRAMUSCULAR | Status: DC | PRN
  Administered 2023-12-17: 5 mg via INTRAVENOUS

## 2023-12-17 MED ORDER — FENTANYL CITRATE (PF) 50 MCG/ML IJ SOSY: 25.0000 ug | PREFILLED_SYRINGE | INTRAMUSCULAR | Status: DC | PRN

## 2023-12-17 MED ORDER — 0.9 % SODIUM CHLORIDE (POUR BTL) OPTIME
TOPICAL | Status: DC | PRN
  Administered 2023-12-17: 1000 mL

## 2023-12-17 MED ORDER — CEFAZOLIN SODIUM-DEXTROSE 2-4 GM/100ML-% IV SOLN
2.0000 g | INTRAVENOUS | Status: AC
  Administered 2023-12-17: 2 g via INTRAVENOUS
  Filled 2023-12-17: qty 100

## 2023-12-17 MED ORDER — PROPOFOL 10 MG/ML IV BOLUS
INTRAVENOUS | Status: DC | PRN
  Administered 2023-12-17: 20 mg via INTRAVENOUS
  Administered 2023-12-17: 120 mg via INTRAVENOUS
  Administered 2023-12-17: 20 mg via INTRAVENOUS

## 2023-12-17 MED ORDER — TRANEXAMIC ACID-NACL 1000-0.7 MG/100ML-% IV SOLN
1000.0000 mg | INTRAVENOUS | Status: AC
  Administered 2023-12-17: 1000 mg via INTRAVENOUS
  Filled 2023-12-17: qty 100

## 2023-12-17 MED ORDER — BUPIVACAINE LIPOSOME 1.3 % IJ SUSP: 20.0000 mL | Freq: Once | INTRAMUSCULAR | Status: DC

## 2023-12-17 MED ORDER — PROPOFOL 500 MG/50ML IV EMUL
INTRAVENOUS | Status: DC | PRN
  Administered 2023-12-17: 150 ug/kg/min via INTRAVENOUS

## 2023-12-17 MED ORDER — CEFAZOLIN SODIUM-DEXTROSE 2-4 GM/100ML-% IV SOLN
2.0000 g | Freq: Four times a day (QID) | INTRAVENOUS | Status: DC
  Administered 2023-12-17: 2 g via INTRAVENOUS

## 2023-12-17 MED ORDER — ACETAMINOPHEN 10 MG/ML IV SOLN: 1000.0000 mg | Freq: Once | INTRAVENOUS | Status: DC | PRN

## 2023-12-17 MED ORDER — LACTATED RINGERS IV BOLUS
500.0000 mL | Freq: Once | INTRAVENOUS | Status: AC
  Administered 2023-12-17: 500 mL via INTRAVENOUS

## 2023-12-17 MED ORDER — CEFAZOLIN SODIUM-DEXTROSE 2-4 GM/100ML-% IV SOLN
INTRAVENOUS | Status: AC
  Filled 2023-12-17: qty 100

## 2023-12-17 MED ORDER — DROPERIDOL 2.5 MG/ML IJ SOLN: 0.6250 mg | Freq: Once | INTRAMUSCULAR | Status: DC | PRN

## 2023-12-17 MED ORDER — PROPOFOL 10 MG/ML IV BOLUS
INTRAVENOUS | Status: AC
  Filled 2023-12-17: qty 20

## 2023-12-17 MED ORDER — BUPIVACAINE LIPOSOME 1.3 % IJ SUSP
INTRAMUSCULAR | Status: AC
  Filled 2023-12-17: qty 20

## 2023-12-17 MED ORDER — FENTANYL CITRATE (PF) 100 MCG/2ML IJ SOLN
INTRAMUSCULAR | Status: DC | PRN
  Administered 2023-12-17: 50 ug via INTRAVENOUS
  Administered 2023-12-17 (×2): 25 ug via INTRAVENOUS
  Administered 2023-12-17 (×3): 50 ug via INTRAVENOUS

## 2023-12-17 MED ORDER — TRANEXAMIC ACID-NACL 1000-0.7 MG/100ML-% IV SOLN
INTRAVENOUS | Status: AC
  Filled 2023-12-17: qty 100

## 2023-12-17 MED ORDER — LACTATED RINGERS IV BOLUS: 250.0000 mL | Freq: Once | INTRAVENOUS | Status: DC

## 2023-12-17 MED ORDER — PROPOFOL 1000 MG/100ML IV EMUL
INTRAVENOUS | Status: AC
  Filled 2023-12-17: qty 100

## 2023-12-17 MED ORDER — FENTANYL CITRATE (PF) 50 MCG/ML IJ SOSY: 50.0000 ug | PREFILLED_SYRINGE | Freq: Once | INTRAMUSCULAR | Status: DC

## 2023-12-17 MED ORDER — ONDANSETRON HCL 4 MG/2ML IJ SOLN: 4.0000 mg | Freq: Once | INTRAMUSCULAR | Status: DC | PRN

## 2023-12-17 MED ORDER — POVIDONE-IODINE 10 % EX SWAB
2.0000 | Freq: Once | CUTANEOUS | Status: DC
Start: 2023-12-17 — End: 2023-12-17

## 2023-12-17 MED ORDER — ASPIRIN EC 81 MG PO TBEC: 81.0000 mg | DELAYED_RELEASE_TABLET | Freq: Two times a day (BID) | ORAL | 0 refills | Status: AC

## 2023-12-17 MED ORDER — ONDANSETRON HCL 4 MG/2ML IJ SOLN
INTRAMUSCULAR | Status: AC
  Filled 2023-12-17: qty 2

## 2023-12-17 MED ORDER — OXYCODONE HCL 5 MG/5ML PO SOLN: 5.0000 mg | Freq: Once | ORAL | Status: DC | PRN

## 2023-12-17 MED ORDER — METHOCARBAMOL 1000 MG/10ML IJ SOLN: 500.0000 mg | Freq: Four times a day (QID) | INTRAMUSCULAR | Status: DC | PRN

## 2023-12-17 MED ORDER — TRANEXAMIC ACID-NACL 1000-0.7 MG/100ML-% IV SOLN
1000.0000 mg | Freq: Once | INTRAVENOUS | Status: AC
  Administered 2023-12-17: 1000 mg via INTRAVENOUS

## 2023-12-17 MED ORDER — BUPIVACAINE-EPINEPHRINE (PF) 0.25% -1:200000 IJ SOLN
INTRAMUSCULAR | Status: AC
  Filled 2023-12-17: qty 30

## 2023-12-17 MED ORDER — KETAMINE HCL 50 MG/5ML IJ SOSY
PREFILLED_SYRINGE | INTRAMUSCULAR | Status: DC | PRN
Start: 2023-12-17 — End: 2023-12-17
  Administered 2023-12-17: 30 mg via INTRAVENOUS

## 2023-12-17 MED ORDER — STERILE WATER FOR IRRIGATION IR SOLN
Status: DC | PRN
  Administered 2023-12-17: 2000 mL

## 2023-12-17 MED ORDER — SODIUM CHLORIDE (PF) 0.9 % IJ SOLN
INTRAMUSCULAR | Status: DC | PRN
  Administered 2023-12-17: 80 mL

## 2023-12-17 MED ORDER — METHOCARBAMOL 500 MG PO TABS: 500.0000 mg | ORAL_TABLET | Freq: Four times a day (QID) | ORAL | Status: DC | PRN

## 2023-12-17 MED ORDER — TRANEXAMIC ACID 1000 MG/10ML IV SOLN
INTRAVENOUS | Status: DC | PRN
  Administered 2023-12-17: 2000 mg via TOPICAL

## 2023-12-17 MED ORDER — GLYCOPYRROLATE 0.2 MG/ML IJ SOLN
INTRAMUSCULAR | Status: AC
  Filled 2023-12-17: qty 1

## 2023-12-17 MED ORDER — TRANEXAMIC ACID 1000 MG/10ML IV SOLN
2000.0000 mg | INTRAVENOUS | Status: DC
  Filled 2023-12-17: qty 20

## 2023-12-17 MED ORDER — LACTATED RINGERS IV SOLN: INTRAVENOUS | Status: DC

## 2023-12-17 MED ORDER — SODIUM CHLORIDE 0.9 % IR SOLN
Status: DC | PRN
  Administered 2023-12-17: 3000 mL

## 2023-12-17 MED ORDER — FENTANYL CITRATE (PF) 50 MCG/ML IJ SOSY
50.0000 ug | PREFILLED_SYRINGE | Freq: Once | INTRAMUSCULAR | Status: AC
  Administered 2023-12-17: 50 ug via INTRAVENOUS
  Filled 2023-12-17: qty 2

## 2023-12-17 MED ORDER — PHENYLEPHRINE HCL-NACL 20-0.9 MG/250ML-% IV SOLN
INTRAVENOUS | Status: DC | PRN
Start: 2023-12-17 — End: 2023-12-17
  Administered 2023-12-17: 50 ug/min via INTRAVENOUS

## 2023-12-17 MED ORDER — ORAL CARE MOUTH RINSE: 15.0000 mL | Freq: Once | OROMUCOSAL | Status: AC

## 2023-12-17 MED ORDER — GLYCOPYRROLATE 0.2 MG/ML IJ SOLN
INTRAMUSCULAR | Status: DC | PRN
Start: 2023-12-17 — End: 2023-12-17
  Administered 2023-12-17: .2 mg via INTRAVENOUS

## 2023-12-17 MED ORDER — ONDANSETRON HCL 4 MG/2ML IJ SOLN
INTRAMUSCULAR | Status: DC | PRN
  Administered 2023-12-17: 4 mg via INTRAVENOUS

## 2023-12-17 MED ORDER — LIDOCAINE HCL (PF) 2 % IJ SOLN
INTRAMUSCULAR | Status: AC
  Filled 2023-12-17: qty 5

## 2023-12-17 MED ORDER — KETAMINE HCL 50 MG/5ML IJ SOSY
PREFILLED_SYRINGE | INTRAMUSCULAR | Status: AC
  Filled 2023-12-17: qty 5

## 2023-12-17 MED ORDER — OXYCODONE HCL 5 MG PO TABS: 5.0000 mg | ORAL_TABLET | Freq: Once | ORAL | Status: DC | PRN

## 2023-12-17 MED ORDER — PHENYLEPHRINE 80 MCG/ML (10ML) SYRINGE FOR IV PUSH (FOR BLOOD PRESSURE SUPPORT)
PREFILLED_SYRINGE | INTRAVENOUS | Status: DC | PRN
  Administered 2023-12-17: 80 ug via INTRAVENOUS
  Administered 2023-12-17 (×2): 160 ug via INTRAVENOUS

## 2023-12-17 SURGICAL SUPPLY — 48 items
ATTUNE PS FEM RT SZ 4 CEM KNEE (Femur) IMPLANT
ATTUNE PSRP INSR SZ4 7 KNEE (Insert) IMPLANT
BAG COUNTER SPONGE SURGICOUNT (BAG) ×1 IMPLANT
BAG DECANTER FOR FLEXI CONT (MISCELLANEOUS) ×1 IMPLANT
BAG ZIPLOCK 12X15 (MISCELLANEOUS) ×1 IMPLANT
BASE TIBIA ATTUNE KNEE SYS SZ6 (Knees) IMPLANT
BLADE SAGITTAL 25.0X1.19X90 (BLADE) ×1 IMPLANT
BLADE SAW SGTL 11.0X1.19X90.0M (BLADE) ×1 IMPLANT
BLADE SURG SZ10 CARB STEEL (BLADE) ×1 IMPLANT
BNDG ELASTIC 6INX 5YD STR LF (GAUZE/BANDAGES/DRESSINGS) ×1 IMPLANT
BOOTIES KNEE HIGH SLOAN (MISCELLANEOUS) ×1 IMPLANT
BOWL SMART MIX CTS (DISPOSABLE) ×1 IMPLANT
CEMENT HV SMART SET (Cement) ×2 IMPLANT
COVER SURGICAL LIGHT HANDLE (MISCELLANEOUS) ×1 IMPLANT
CUFF TRNQT CYL 34X4.125X (TOURNIQUET CUFF) ×1 IMPLANT
DRAPE TOP 10253 STERILE (DRAPES) ×1 IMPLANT
DRAPE U-SHAPE 47X51 STRL (DRAPES) ×1 IMPLANT
DRSG AQUACEL AG ADV 3.5X10 (GAUZE/BANDAGES/DRESSINGS) ×1 IMPLANT
DURAPREP 26ML APPLICATOR (WOUND CARE) ×2 IMPLANT
ELECT PENCIL ROCKER SW 15FT (MISCELLANEOUS) ×1 IMPLANT
ELECT REM PT RETURN 15FT ADLT (MISCELLANEOUS) ×1 IMPLANT
GLOVE BIO SURGEON STRL SZ8 (GLOVE) ×2 IMPLANT
GLOVE BIOGEL PI IND STRL 7.0 (GLOVE) ×1 IMPLANT
GLOVE BIOGEL PI IND STRL 8 (GLOVE) ×2 IMPLANT
GLOVE SURG SYN 7.0 PF PI (GLOVE) ×1 IMPLANT
GOWN SRG XL LVL 4 BRTHBL STRL (GOWNS) ×1 IMPLANT
GOWN STRL REUS W/ TWL XL LVL3 (GOWN DISPOSABLE) ×2 IMPLANT
HOLDER FOLEY CATH W/STRAP (MISCELLANEOUS) IMPLANT
HOOD PEEL AWAY T7 (MISCELLANEOUS) ×3 IMPLANT
KIT TURNOVER KIT A (KITS) ×1 IMPLANT
MANIFOLD NEPTUNE II (INSTRUMENTS) ×1 IMPLANT
NS IRRIG 1000ML POUR BTL (IV SOLUTION) ×1 IMPLANT
PACK TOTAL KNEE CUSTOM (KITS) ×1 IMPLANT
PAD ARMBOARD POSITIONER FOAM (MISCELLANEOUS) ×1 IMPLANT
PATELLA MEDIAL ATTUN 35MM KNEE (Knees) IMPLANT
PIN STEINMAN FIXATION KNEE (PIN) IMPLANT
PROTECTOR NERVE ULNAR (MISCELLANEOUS) ×1 IMPLANT
SET HNDPC FAN SPRY TIP SCT (DISPOSABLE) ×1 IMPLANT
SPIKE FLUID TRANSFER (MISCELLANEOUS) ×2 IMPLANT
SUT ETHIBOND NAB CT1 #1 30IN (SUTURE) ×1 IMPLANT
SUT VIC AB 0 CT1 36 (SUTURE) ×2 IMPLANT
SUT VIC AB 2-0 CT1 TAPERPNT 27 (SUTURE) ×1 IMPLANT
SUT VICRYL AB 3-0 FS1 BRD 27IN (SUTURE) ×1 IMPLANT
SUTURE STRATFX 0 PDS 27 VIOLET (SUTURE) ×1 IMPLANT
TRAY FOLEY MTR SLVR 16FR STAT (SET/KITS/TRAYS/PACK) IMPLANT
WATER STERILE IRR 1000ML POUR (IV SOLUTION) ×2 IMPLANT
WRAP KNEE MAXI GEL POST OP (GAUZE/BANDAGES/DRESSINGS) ×1 IMPLANT
YANKAUER SUCT BULB TIP NO VENT (SUCTIONS) ×1 IMPLANT

## 2023-12-17 NOTE — Evaluation (Signed)
 Physical Therapy Evaluation Patient Details Name: Zachary [name] MRN: [id number] DOB: Feb 22, 1941 Today's Date: 12/17/2023  History of Present Illness  83 yo male  presents to therapy s/p R TKA on 12/17/2023 due to failure of conservative measures. Pt PMH includes but is not limited to: RBBB, HLD, PSVT, CAD s/p CABG x 4, HTN, NSTEMI, and back surgery.  Clinical Impression    Zachary [name] is a 83 y.o. male POD 0 s/p R TKA. Patient reports IND with mobility at baseline. Patient is now limited by functional impairments (see PT problem list below) and requires CGA for transfers and gait with RW. Patient was able to ambulate 55 and 40 feet with RW and CGA and cues for safe walker management. Patient educated on safe sequencing for stair mobility with R hand rail and HHA, fall risk prevention, slowly increasing activity, pain management and goal, use of CP and ice and car transfers pt and daughter  verbalized understanding of safe guarding position for people assisting with mobility. Patient instructed in exercises to facilitate ROM and circulation reviewed and HO provided. Patient will benefit from continued skilled PT interventions to address impairments and progress towards PLOF. Patient has met mobility goals at adequate level for discharge to daughters  home x 1 wk with [facility] services; will continue to follow if pt continues acute stay to progress towards Mod I goals.       If plan is discharge home, recommend the following: A little help with walking and/or transfers;A little help with bathing/dressing/bathroom;Assistance with cooking/housework;Assist for transportation;Help with stairs or ramp for entrance   Can travel by private vehicle        Equipment Recommendations None recommended by PT  Recommendations for Other Services       Functional Status Assessment Patient has had a recent decline in their functional status and demonstrates the ability to make significant improvements in function  in a reasonable and predictable amount of time.     Precautions / Restrictions Precautions Precautions: Knee;Fall Restrictions Weight Bearing Restrictions Per Provider Order: No      Mobility  Bed Mobility Overal bed mobility: Needs Assistance Bed Mobility: Supine to Sit     Supine to sit: Supervision     General bed mobility comments: min cues HOB slightly elevated    Transfers Overall transfer level: Needs assistance Equipment used: Rolling walker (2 wheels) Transfers: Sit to/from Stand Sit to Stand: Contact guard assist           General transfer comment: min cues    Ambulation/Gait Ambulation/Gait assistance: Contact guard assist Gait Distance (Feet): 55 Feet Assistive device: Rolling walker (2 wheels) Gait Pattern/deviations: Step-to pattern, Decreased stance time - right, Antalgic, Trunk flexed Gait velocity: decreased     General Gait Details: slight trunk flexion with B UE support at RW to offload R LE in stance phase, cues for RW management to roll vs lift and place RW and min cues for posture and safety  Stairs Stairs: Yes Stairs assistance: Contact guard assist Stair Management: Two rails, One rail Right Number of Stairs: 3 General stair comments: step navigation initiated with B handrail and cues for safety, sequncing and step to pattern with pt able to progress to R handrail and HHA with good recall of technquie  Wheelchair Mobility     Tilt Bed    Modified Rankin (Stroke Patients Only)       Balance Overall balance assessment: Needs assistance Sitting-balance support: Feet supported Sitting balance-Leahy Scale: Good  Standing balance support: Bilateral upper extremity supported, During functional activity, Reliant on assistive device for balance Standing balance-Leahy Scale: Fair Standing balance comment: static standing no UE support                             Pertinent Vitals/Pain Pain Assessment Pain  Assessment: 0-10 Pain Score: 1  Pain Location: R knee and LE Pain Descriptors / Indicators: Sore, Dull, Discomfort Pain Intervention(s): Limited activity within patient's tolerance, Premedicated before session, Monitored during session, Repositioned, Ice applied    Home Living Family/patient expects to be discharged to:: Private residence Living Arrangements: Alone Available Help at Discharge: Family Type of Home: House Home Access: Stairs to enter Entrance Stairs-Rails: Right Entrance Stairs-Number of Steps: 6   Home Layout: Able to live on main level with bedroom/bathroom;Two level Home Equipment: Agricultural consultant (2 wheels);Shower seat;Cane - single point;BSC/3in1      Prior Function Prior Level of Function : Independent/Modified Independent             Mobility Comments: IND no AD for all ADLs, self care tasks and IADLs       Extremity/Trunk Assessment        Lower Extremity Assessment Lower Extremity Assessment: RLE deficits/detail RLE Deficits / Details: ankle DF/PF 5/5; SLR < 10 degree lag RLE Sensation: WNL    Cervical / Trunk Assessment Cervical / Trunk Assessment: Neck Surgery  Communication   Communication Communication: No apparent difficulties    Cognition Arousal: Alert Behavior During Therapy: WFL for tasks assessed/performed   PT - Cognitive impairments: No apparent impairments                         Following commands: Intact       Cueing       General Comments      Exercises Total Joint Exercises Ankle Circles/Pumps: AROM, Both, 10 reps Quad Sets: AROM, Right, 5 reps Short Arc Quad: AROM, Right, 5 reps Heel Slides: AROM, Right, 5 reps Hip ABduction/ADduction: AROM, Right, 5 reps Straight Leg Raises: AROM, Right, 5 reps Knee Flexion: AROM, Right, 5 reps   Assessment/Plan    PT Assessment Patient needs continued PT services  PT Problem List Decreased strength;Decreased range of motion;Decreased activity  tolerance;Decreased balance;Decreased mobility;Pain       PT Treatment Interventions DME instruction;Gait training;Stair training;Functional mobility training;Therapeutic activities;Therapeutic exercise;Balance training;Neuromuscular re-education;Patient/family education;Modalities    PT Goals (Current goals can be found in the Care Plan section)  Acute Rehab PT Goals Patient Stated Goal: get back to working, remodling home, get back to gym PT Goal Formulation: With patient Time For Goal Achievement: 12/31/23 Potential to Achieve Goals: Good    Frequency 7X/week     Co-evaluation               AM-PAC PT 6 Clicks Mobility  Outcome Measure Help needed turning from your back to your side while in a flat bed without using bedrails?: None Help needed moving from lying on your back to sitting on the side of a flat bed without using bedrails?: A Little Help needed moving to and from a bed to a chair (including a wheelchair)?: A Little Help needed standing up from a chair using your arms (e.g., wheelchair or bedside chair)?: A Little Help needed to walk in hospital room?: A Little Help needed climbing 3-5 steps with a railing? : A Little 6 Click Score: 19  End of Session Equipment Utilized During Treatment: Gait belt       PT Visit Diagnosis: Unsteadiness on feet (R26.81);Other abnormalities of gait and mobility (R26.89);Muscle weakness (generalized) (M62.81);Difficulty in walking, not elsewhere classified (R26.2);Pain Pain - Right/Left: Right Pain - part of body: Knee;Leg    Time: 8570-8476 PT Time Calculation (min) (ACUTE ONLY): 54 min   Charges:   PT Evaluation $PT Eval Low Complexity: 1 Low PT Treatments $Gait Training: 8-22 mins $Therapeutic Exercise: 8-22 mins $Therapeutic Activity: 8-22 mins PT General Charges $$ ACUTE PT VISIT: 1 Visit         [place], PT Acute Rehab   [place] VEAR Drone 12/17/2023, 3:42 PM

## 2023-12-17 NOTE — Interval H&P Note (Signed)
 History and Physical Interval Note:  12/17/2023 9:10 AM  Zachary [name] Flow  has presented today for surgery, with the diagnosis of RIGHT KNEE DEGENERATIVE JOINT DISEASE.  The various methods of treatment have been discussed with the patient and family. After consideration of risks, benefits and other options for treatment, the patient has consented to  Procedure(s): ARTHROPLASTY, KNEE, TOTAL (Right) as a surgical intervention.  The patient's history has been reviewed, patient examined, no change in status, stable for surgery.  I have reviewed the patient's chart and labs.  Questions were answered to the patient's satisfaction.     [name]

## 2023-12-17 NOTE — Op Note (Signed)
 PREOP DIAGNOSIS: DJD RIGHT KNEE POSTOP DIAGNOSIS: same PROCEDURE: RIGHT TKR ANESTHESIA: Spinal and MAC ATTENDING SURGEON: Zachary [name] ASSISTANT: Zachary Earl PA  INDICATIONS FOR PROCEDURE: SEDALE [name] is a 83 y.o. male who has struggled for a long time with pain due to degenerative arthritis of the right knee.  The patient has failed many conservative non-operative measures and at this point has pain which limits the ability to sleep and walk.  The patient is offered total knee replacement.  Informed operative consent was obtained after discussion of possible risks of anesthesia, infection, neurovascular injury, DVT, and death.  The importance of the post-operative rehabilitation protocol to optimize result was stressed extensively with the patient.  SUMMARY OF FINDINGS AND PROCEDURE:  Zachary [name] was taken to the operative suite where under the above anesthesia a right knee replacement was performed.  There were advanced degenerative changes and the bone quality was excellent.  We used the DePuy Attune system and placed size 4 femur, 6 tibia, 35 mm all polyethylene patella, and a size 7 mm spacer.  Zachary Earl PA-C assisted throughout and was invaluable to the completion of the case in that he helped retract and maintain exposure while I placed components.  He also helped close thereby minimizing OR time.  The patient was admitted for appropriate post-op care to include perioperative antibiotics and mechanical and pharmacologic measures for DVT prophylaxis.  DESCRIPTION OF PROCEDURE:  Zachary [name] was taken to the operative suite where the above anesthesia was applied.  The patient was positioned supine and prepped and draped in normal sterile fashion.  An appropriate time out was performed.  After the administration of kefzol  pre-op antibiotic the leg was elevated and exsanguinated and a tourniquet inflated. A standard longitudinal incision was made on the anterior knee.  Dissection was carried  down to the extensor mechanism.  All appropriate anti-infective measures were used including the pre-operative antibiotic, betadine impregnated drape, and closed hooded exhaust systems for each member of the surgical team.  A medial parapatellar incision was made in the extensor mechanism and the knee cap flipped and the knee flexed.  Some residual meniscal tissues were removed along with any remaining ACL/PCL tissue.  A guide was placed on the tibia and a flat cut was made on it's superior surface.  An intramedullary guide was placed in the femur and was utilized to make anterior and posterior cuts creating an appropriate flexion gap.  A second intramedullary guide was placed in the femur to make a distal cut properly balancing the knee with an extension gap equal to the flexion gap.  The three bones sized to the above mentioned sizes and the appropriate guides were placed and utilized.  A trial reduction was done and the knee easily came to full extension and the patella tracked well on flexion.  The trial components were removed and all bones were cleaned with pulsatile lavage and then dried thoroughly.  Cement was mixed and was pressurized onto the bones followed by placement of the aforementioned components.  Excess cement was trimmed and pressure was held on the components until the cement had hardened.  The tourniquet was deflated and a small amount of bleeding was controlled with cautery and pressure.  The knee was irrigated thoroughly.  The extensor mechanism was re-approximated with #1 ethibond in interrupted fashion.  The knee was flexed and the repair was solid.  The subcutaneous tissues were re-approximated with #0 and #2-0 vicryl and the skin closed with  a subcuticular stitch and steristrips.  A sterile dressing was applied.  Intraoperative fluids, EBL, and tourniquet time can be obtained from anesthesia records.  DISPOSITION:  The patient was taken to recovery room in stable condition and scheduled  to potentially go home same day depending on ability to walk and tolerate liquids..  Zachary [name] 12/17/2023, 11:44 AM

## 2023-12-17 NOTE — Anesthesia Procedure Notes (Signed)
 Anesthesia Regional Block: Adductor canal block   Pre-Anesthetic Checklist: , timeout performed,  Correct Patient, Correct Site, Correct Laterality,  Correct Procedure, Correct Position, site marked,  Risks and benefits discussed,  Surgical consent,  Pre-op evaluation,  At surgeon's request and post-op pain management  Laterality: Lower and Right  Prep: chloraprep       Needles:  Injection technique: Single-shot  Needle Type: Echogenic Stimulator Needle     Needle Length: 10cm  Needle Gauge: 20     Additional Needles:   Procedures:,,,, ultrasound used (permanent image in chart),, #20gu IV placed    Narrative:  Start time: 12/17/2023 9:57 AM End time: 12/17/2023 9:57 AM Injection made incrementally with aspirations every 5 mL.  Performed by: Personally  Anesthesiologist: Waddell Lauraine NOVAK, MD  Additional Notes: Timeout performed with bedside RN - Name, DOB, allergies and laterality confirmed by the patient and RN. Surgical marking performed/confirmed. Anticoagulation status and most recent platelet count reviewed. Patient placed in a frog-leg position and sedation (as documented by RN) given via PIV. Peripheral nerve block performed as documented above. VSS throughout (see Flowchart).

## 2023-12-17 NOTE — Anesthesia Procedure Notes (Signed)
 Procedure Name: LMA Insertion Date/Time: 12/17/2023 10:29 AM  Performed by: Therisa Doyal CROME, CRNAPatient Re-evaluated:Patient Re-evaluated prior to induction Oxygen Delivery Method: Circle system utilized Preoxygenation: Pre-oxygenation with 100% oxygen Induction Type: IV induction LMA: LMA inserted LMA Size: 4.0 Number of attempts: 1 Placement Confirmation: positive ETCO2 and breath sounds checked- equal and bilateral Tube secured with: Tape Dental Injury: Teeth and Oropharynx as per pre-operative assessment

## 2023-12-17 NOTE — Anesthesia Procedure Notes (Addendum)
 Spinal  Patient location during procedure: OR Start time: 12/17/2023 10:21 AM End time: 12/17/2023 10:25 AM Reason for block: surgical anesthesia Staffing Performed: resident/CRNA  Resident/CRNA: Therisa Doyal CROME, CRNA Performed by: Therisa Doyal CROME, CRNA Authorized by: Waddell Lauraine NOVAK, MD   Preanesthetic Checklist Completed: patient identified, IV checked, site marked, risks and benefits discussed, surgical consent, monitors and equipment checked, pre-op evaluation and timeout performed Spinal Block Patient position: sitting Prep: DuraPrep Patient monitoring: heart rate, cardiac monitor, continuous pulse ox and blood pressure Approach: midline Location: L3-4 Injection technique: single-shot Needle Needle type: Pencan  Needle gauge: 24 G (no needle) Needle length: 10 cm Assessment Sensory level: L2 (spinal did not set up) Events: CSF return Additional Notes Introducer placed.  Appeared to have clear fluid in hub.  Proceeded to inject Marcaine  as instructed.  Patient laid flat. Urinary catheter placed about 4 minutes later.  No change in sensation.  Patient uncomfortable and moving all extremities.  Will proceed with GA  Kit expiration 11/02/2025 and lot# [id number]

## 2023-12-17 NOTE — Transfer of Care (Signed)
 Immediate Anesthesia Transfer of Care Note  Patient: Zachary [name]  Procedure(s) Performed: RIGHT TOTAL KNEE ARTHROPLASTY (Right: Knee)  Patient Location: PACU  Anesthesia Type:General  Level of Consciousness: awake  Airway & Oxygen Therapy: Patient Spontanous Breathing and Patient connected to face mask oxygen  Post-op Assessment: Report given to RN and Post -op Vital signs reviewed and stable  Post vital signs: Reviewed and stable  Last Vitals:  Vitals Value Taken Time  BP 141/73 12/17/23 12:08  Temp    Pulse 59 12/17/23 12:10  Resp 12 12/17/23 12:10  SpO2 100 % 12/17/23 12:10  Vitals shown include unfiled device data.  Last Pain:  Vitals:   12/17/23 1004  TempSrc:   PainSc: 0-No pain         Complications: No notable events documented.

## 2023-12-17 NOTE — Anesthesia Postprocedure Evaluation (Signed)
 Anesthesia Post Note  Patient: Zachary [name]  Procedure(s) Performed: RIGHT TOTAL KNEE ARTHROPLASTY (Right: Knee)     Patient location during evaluation: PACU Anesthesia Type: General Level of consciousness: awake Pain management: pain level controlled Vital Signs Assessment: post-procedure vital signs reviewed and stable Respiratory status: spontaneous breathing Cardiovascular status: blood pressure returned to baseline Postop Assessment: no apparent nausea or vomiting Anesthetic complications: yes   Encounter Notable Events  Notable Event Outcome Phase Comment  Unintended dural puncture N/A Intraprocedure wet tap with introducer needle    Last Vitals:  Vitals:   12/17/23 1400 12/17/23 1415  BP: 123/62 (!) 141/87  Pulse: 62 66  Resp: 15 12  Temp:    SpO2: 96% 95%    Last Pain:  Vitals:   12/17/23 1415  TempSrc:   PainSc: 0-No pain                 Lauraine KATHEE [place]

## 2023-12-17 NOTE — Progress Notes (Signed)
 Spoke with patient this morning about time change for surgery,  Patient verbalized that he could come in at 0715 for 0941 surgery time.

## 2023-12-18 ENCOUNTER — Encounter (HOSPITAL_COMMUNITY): Payer: Self-pay | Admitting: Orthopaedic Surgery

## 2023-12-19 DIAGNOSIS — Z951 Presence of aortocoronary bypass graft: Secondary | ICD-10-CM | POA: Diagnosis not present

## 2023-12-19 DIAGNOSIS — E785 Hyperlipidemia, unspecified: Secondary | ICD-10-CM | POA: Diagnosis not present

## 2023-12-19 DIAGNOSIS — Z7982 Long term (current) use of aspirin: Secondary | ICD-10-CM | POA: Diagnosis not present

## 2023-12-19 DIAGNOSIS — I4510 Unspecified right bundle-branch block: Secondary | ICD-10-CM | POA: Diagnosis not present

## 2023-12-19 DIAGNOSIS — I2510 Atherosclerotic heart disease of native coronary artery without angina pectoris: Secondary | ICD-10-CM | POA: Diagnosis not present

## 2023-12-19 DIAGNOSIS — Z471 Aftercare following joint replacement surgery: Secondary | ICD-10-CM | POA: Diagnosis not present

## 2023-12-19 DIAGNOSIS — M2570 Osteophyte, unspecified joint: Secondary | ICD-10-CM | POA: Diagnosis not present

## 2023-12-19 DIAGNOSIS — Z96651 Presence of right artificial knee joint: Secondary | ICD-10-CM | POA: Diagnosis not present

## 2023-12-19 DIAGNOSIS — I252 Old myocardial infarction: Secondary | ICD-10-CM | POA: Diagnosis not present

## 2023-12-19 DIAGNOSIS — Z87891 Personal history of nicotine dependence: Secondary | ICD-10-CM | POA: Diagnosis not present

## 2023-12-19 DIAGNOSIS — I4719 Other supraventricular tachycardia: Secondary | ICD-10-CM | POA: Diagnosis not present

## 2023-12-19 DIAGNOSIS — Z604 Social exclusion and rejection: Secondary | ICD-10-CM | POA: Diagnosis not present

## 2023-12-19 DIAGNOSIS — I10 Essential (primary) hypertension: Secondary | ICD-10-CM | POA: Diagnosis not present

## 2023-12-24 DIAGNOSIS — M6281 Muscle weakness (generalized): Secondary | ICD-10-CM | POA: Insufficient documentation

## 2023-12-24 DIAGNOSIS — M25661 Stiffness of right knee, not elsewhere classified: Secondary | ICD-10-CM | POA: Insufficient documentation

## 2023-12-24 DIAGNOSIS — R262 Difficulty in walking, not elsewhere classified: Secondary | ICD-10-CM | POA: Insufficient documentation

## 2023-12-25 DIAGNOSIS — M6281 Muscle weakness (generalized): Secondary | ICD-10-CM | POA: Diagnosis not present

## 2023-12-25 DIAGNOSIS — Z96651 Presence of right artificial knee joint: Secondary | ICD-10-CM | POA: Diagnosis not present

## 2023-12-25 DIAGNOSIS — M25661 Stiffness of right knee, not elsewhere classified: Secondary | ICD-10-CM | POA: Diagnosis not present

## 2023-12-25 DIAGNOSIS — R262 Difficulty in walking, not elsewhere classified: Secondary | ICD-10-CM | POA: Diagnosis not present

## 2023-12-27 DIAGNOSIS — Z96651 Presence of right artificial knee joint: Secondary | ICD-10-CM | POA: Diagnosis not present

## 2023-12-27 DIAGNOSIS — M6281 Muscle weakness (generalized): Secondary | ICD-10-CM | POA: Diagnosis not present

## 2023-12-27 DIAGNOSIS — M25661 Stiffness of right knee, not elsewhere classified: Secondary | ICD-10-CM | POA: Diagnosis not present

## 2023-12-27 DIAGNOSIS — R262 Difficulty in walking, not elsewhere classified: Secondary | ICD-10-CM | POA: Diagnosis not present

## 2024-01-01 DIAGNOSIS — M6281 Muscle weakness (generalized): Secondary | ICD-10-CM | POA: Diagnosis not present

## 2024-01-01 DIAGNOSIS — R262 Difficulty in walking, not elsewhere classified: Secondary | ICD-10-CM | POA: Diagnosis not present

## 2024-01-01 DIAGNOSIS — M25661 Stiffness of right knee, not elsewhere classified: Secondary | ICD-10-CM | POA: Diagnosis not present

## 2024-01-01 DIAGNOSIS — Z96651 Presence of right artificial knee joint: Secondary | ICD-10-CM | POA: Diagnosis not present

## 2024-01-03 DIAGNOSIS — M6281 Muscle weakness (generalized): Secondary | ICD-10-CM | POA: Diagnosis not present

## 2024-01-03 DIAGNOSIS — M25661 Stiffness of right knee, not elsewhere classified: Secondary | ICD-10-CM | POA: Diagnosis not present

## 2024-01-03 DIAGNOSIS — Z96651 Presence of right artificial knee joint: Secondary | ICD-10-CM | POA: Diagnosis not present

## 2024-01-03 DIAGNOSIS — R262 Difficulty in walking, not elsewhere classified: Secondary | ICD-10-CM | POA: Diagnosis not present

## 2024-01-07 DIAGNOSIS — R262 Difficulty in walking, not elsewhere classified: Secondary | ICD-10-CM | POA: Diagnosis not present

## 2024-01-07 DIAGNOSIS — Z96651 Presence of right artificial knee joint: Secondary | ICD-10-CM | POA: Diagnosis not present

## 2024-01-07 DIAGNOSIS — M25661 Stiffness of right knee, not elsewhere classified: Secondary | ICD-10-CM | POA: Diagnosis not present

## 2024-01-07 DIAGNOSIS — M6281 Muscle weakness (generalized): Secondary | ICD-10-CM | POA: Diagnosis not present

## 2024-01-09 DIAGNOSIS — M25661 Stiffness of right knee, not elsewhere classified: Secondary | ICD-10-CM | POA: Diagnosis not present

## 2024-01-09 DIAGNOSIS — R262 Difficulty in walking, not elsewhere classified: Secondary | ICD-10-CM | POA: Diagnosis not present

## 2024-01-09 DIAGNOSIS — Z96651 Presence of right artificial knee joint: Secondary | ICD-10-CM | POA: Diagnosis not present

## 2024-01-09 DIAGNOSIS — M6281 Muscle weakness (generalized): Secondary | ICD-10-CM | POA: Diagnosis not present

## 2024-01-13 DIAGNOSIS — Z96651 Presence of right artificial knee joint: Secondary | ICD-10-CM | POA: Diagnosis not present

## 2024-01-13 DIAGNOSIS — M6281 Muscle weakness (generalized): Secondary | ICD-10-CM | POA: Diagnosis not present

## 2024-01-13 DIAGNOSIS — R262 Difficulty in walking, not elsewhere classified: Secondary | ICD-10-CM | POA: Diagnosis not present

## 2024-01-13 DIAGNOSIS — M25661 Stiffness of right knee, not elsewhere classified: Secondary | ICD-10-CM | POA: Diagnosis not present

## 2024-01-15 DIAGNOSIS — G8929 Other chronic pain: Secondary | ICD-10-CM | POA: Insufficient documentation

## 2024-01-15 DIAGNOSIS — M6281 Muscle weakness (generalized): Secondary | ICD-10-CM | POA: Diagnosis not present

## 2024-01-15 DIAGNOSIS — Z96651 Presence of right artificial knee joint: Secondary | ICD-10-CM | POA: Diagnosis not present

## 2024-01-15 DIAGNOSIS — M25661 Stiffness of right knee, not elsewhere classified: Secondary | ICD-10-CM | POA: Diagnosis not present

## 2024-01-15 DIAGNOSIS — R262 Difficulty in walking, not elsewhere classified: Secondary | ICD-10-CM | POA: Diagnosis not present

## 2024-01-23 DIAGNOSIS — M6281 Muscle weakness (generalized): Secondary | ICD-10-CM | POA: Diagnosis not present

## 2024-01-23 DIAGNOSIS — Z96651 Presence of right artificial knee joint: Secondary | ICD-10-CM | POA: Diagnosis not present

## 2024-01-23 DIAGNOSIS — R262 Difficulty in walking, not elsewhere classified: Secondary | ICD-10-CM | POA: Diagnosis not present

## 2024-01-23 DIAGNOSIS — M25661 Stiffness of right knee, not elsewhere classified: Secondary | ICD-10-CM | POA: Diagnosis not present

## 2024-01-29 DIAGNOSIS — R262 Difficulty in walking, not elsewhere classified: Secondary | ICD-10-CM | POA: Diagnosis not present

## 2024-01-29 DIAGNOSIS — M25661 Stiffness of right knee, not elsewhere classified: Secondary | ICD-10-CM | POA: Diagnosis not present

## 2024-01-29 DIAGNOSIS — M6281 Muscle weakness (generalized): Secondary | ICD-10-CM | POA: Diagnosis not present

## 2024-01-29 DIAGNOSIS — Z96651 Presence of right artificial knee joint: Secondary | ICD-10-CM | POA: Diagnosis not present

## 2024-03-11 ENCOUNTER — Other Ambulatory Visit (HOSPITAL_COMMUNITY): Payer: Self-pay | Admitting: Urology

## 2024-03-11 DIAGNOSIS — C61 Malignant neoplasm of prostate: Secondary | ICD-10-CM

## 2024-03-17 NOTE — Progress Notes (Signed)
 GU Location of Tumor / Histology: Prostate Ca  If Prostate Cancer, Gleason Score is (4 + 4) and PSA is (13.10 on 07/31/2023)  Tanda CROME Alsip presented as referral fro Dr. Morene MICAEL Salines [facility]) elevated PSA.  Biopsies     Dr. Morene MICAEL Salines NM PET (PSMA) Skull to Mid Thigh CLINICAL DATA:  HJ PSMA FOR PROSTATE CANCER PSA = 10.0 ng/ml 11/05/2023 Pt states he had prostate bx about 1 mo ago WOV.  IMPRESSION: 1. 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 2. No PSMA avid abdominal, pelvic, or mediastinal lymph nodes. 3. No suspicious pulmonary nodules or skeletal metastases.   10/07/2023 Dr. Morene MICAEL Salines MR Prostate with/without Contrast CLINICAL DATA:  Elevated PSA, PSA of 13.10 on 07/30/2023, Labs scanned in media; No bx, no hx of CA.  IMPRESSION: 1. 12 mm multiparametric MRI signal abnormality within the medial left peripheral zone at the apex is considered pi-rads 3. felt to be similar to on the prior exam. 2. No evidence of locally advanced or pelvic metastatic disease. 3. I performed roi analysis in the dynacad software for possible fusion biopsy.   Past/Anticipated interventions by urology, if any:      Past/Anticipated interventions by medical oncology, if any: NA  Weight changes, if any:  10 lbs after to surgery  IPSS:  6 SHIM: 1 no sexual activity  Bowel/Bladder complaints, if any:  No  Nausea/Vomiting, if any:  No  Pain issues, if any: 0/10  Right knee sore recent surgery 12/17/2023   SAFETY ISSUES: Prior radiation?  No Pacemaker/ICD? No Possible current pregnancy? Male Is the patient on methotrexate? No  Current Complaints / other details:    30 minutes spent total, including time for meaningful use questions, reviewing medication, as well as spent in face-to-face time in nurse evaluation with the patient.

## 2024-03-19 ENCOUNTER — Encounter (HOSPITAL_COMMUNITY)
Admission: RE | Admit: 2024-03-19 | Discharge: 2024-03-19 | Disposition: A | Discharge status: Home / Self Care | Source: Ambulatory Visit | Attending: Urology | Admitting: Urology

## 2024-03-19 ENCOUNTER — Encounter: Payer: Self-pay | Admitting: Radiation Oncology

## 2024-03-19 DIAGNOSIS — C61 Malignant neoplasm of prostate: Secondary | ICD-10-CM | POA: Diagnosis present

## 2024-03-19 MED ORDER — FLOTUFOLASTAT F 18 GALLIUM 296-5846 MBQ/ML IV SOLN
8.4300 | Freq: Once | INTRAVENOUS | Status: AC
  Administered 2024-03-19: 8.43 via INTRAVENOUS

## 2024-03-25 ENCOUNTER — Ambulatory Visit
Admission: RE | Admit: 2024-03-25 | Discharge: 2024-03-25 | Disposition: A | Discharge status: Home / Self Care | Source: Ambulatory Visit | Attending: Radiation Oncology | Admitting: Radiation Oncology

## 2024-03-25 ENCOUNTER — Encounter: Payer: Self-pay | Admitting: Radiation Oncology

## 2024-03-25 ENCOUNTER — Telehealth: Payer: Self-pay | Admitting: *Deleted

## 2024-03-25 ENCOUNTER — Encounter: Payer: Self-pay | Admitting: Urology

## 2024-03-25 VITALS — BP 131/56 | HR 109 | Temp 97.8°F | Resp 20 | Ht 62.5 in | Wt 122.6 lb

## 2024-03-25 DIAGNOSIS — H259 Unspecified age-related cataract: Secondary | ICD-10-CM | POA: Insufficient documentation

## 2024-03-25 DIAGNOSIS — I25810 Atherosclerosis of coronary artery bypass graft(s) without angina pectoris: Secondary | ICD-10-CM | POA: Insufficient documentation

## 2024-03-25 DIAGNOSIS — M1990 Unspecified osteoarthritis, unspecified site: Secondary | ICD-10-CM | POA: Insufficient documentation

## 2024-03-25 DIAGNOSIS — R059 Cough, unspecified: Secondary | ICD-10-CM | POA: Insufficient documentation

## 2024-03-25 DIAGNOSIS — M51379 Other intervertebral disc degeneration, lumbosacral region without mention of lumbar back pain or lower extremity pain: Secondary | ICD-10-CM | POA: Insufficient documentation

## 2024-03-25 DIAGNOSIS — C61 Malignant neoplasm of prostate: Secondary | ICD-10-CM | POA: Insufficient documentation

## 2024-03-25 DIAGNOSIS — E559 Vitamin D deficiency, unspecified: Secondary | ICD-10-CM | POA: Insufficient documentation

## 2024-03-25 DIAGNOSIS — H25019 Cortical age-related cataract, unspecified eye: Secondary | ICD-10-CM | POA: Insufficient documentation

## 2024-03-25 DIAGNOSIS — D3130 Benign neoplasm of unspecified choroid: Secondary | ICD-10-CM | POA: Insufficient documentation

## 2024-03-25 DIAGNOSIS — M5030 Other cervical disc degeneration, unspecified cervical region: Secondary | ICD-10-CM | POA: Insufficient documentation

## 2024-03-25 DIAGNOSIS — H35033 Hypertensive retinopathy, bilateral: Secondary | ICD-10-CM | POA: Insufficient documentation

## 2024-03-25 DIAGNOSIS — R29818 Other symptoms and signs involving the nervous system: Secondary | ICD-10-CM | POA: Insufficient documentation

## 2024-03-25 DIAGNOSIS — R972 Elevated prostate specific antigen [PSA]: Secondary | ICD-10-CM | POA: Insufficient documentation

## 2024-03-25 DIAGNOSIS — J309 Allergic rhinitis, unspecified: Secondary | ICD-10-CM | POA: Insufficient documentation

## 2024-03-25 DIAGNOSIS — G589 Mononeuropathy, unspecified: Secondary | ICD-10-CM | POA: Insufficient documentation

## 2024-03-25 DIAGNOSIS — M519 Unspecified thoracic, thoracolumbar and lumbosacral intervertebral disc disorder: Secondary | ICD-10-CM | POA: Insufficient documentation

## 2024-03-25 HISTORY — DX: Elevated prostate specific antigen (PSA): R97.20

## 2024-03-25 HISTORY — DX: Malignant neoplasm of prostate: C61

## 2024-03-25 HISTORY — DX: Hyperlipidemia, unspecified: E78.5

## 2024-03-25 NOTE — Progress Notes (Incomplete)
 " Radiation Oncology         [phone] ________________________________  Initial Outpatient Consultation  Name: Zachary [name] MRN: [id number]  Date: 03/25/2024  DOB: 05-13-1940  CC:[facility]  Cam Morene ORN, Zachary [name]   REFERRING PHYSICIAN: Cam Morene ORN, Zachary [name]  DIAGNOSIS: 84 y.o. 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 Malignant neoplasm of prostate (HCC)  C61       HISTORY OF PRESENT ILLNESS: Zachary [name] is a 84 y.o. male with a diagnosis of prostate cancer. He has a longstanding history of elevated PSA since approximately March 2022 when it was 7.29.  He initially met Dr. Cam in consult on 07/04/2020 and a repeat PSA at that time was stable at 7.38,  digital rectal examination performed at that time showed bilateral firmness but no discrete nodules.  A repeat PSA in October 2022 was 9.01.  A prostate MRI was performed 02/10/2021 and was without any evidence of high-grade lesions or concerning findings so the decision was to continue to monitor the PSA closely.  The PSA remained stable at 8.49 in April 2023 and 9.14 in November 2023 but increased up to 13.1 in May 2025.  This prompted a repeat prostate MRI which was performed 10/07/2023 and showed a 12 mm PI-RADS 3 lesion in the medial left peripheral zone at the apex.  PSA in September 2025 was 10.0 and a 4K test returned a score of 94 out of 100, indicating a high probability for aggressive prostate cancer.  The patient proceeded to MRI fusion transrectal ultrasound with 15 biopsies of the prostate on 02/21/2024.  The prostate volume measured 38 cc.  Out of 15 core biopsies, 3 were positive.  The maximum Gleason score was 4+4 and this was seen in the right base.  Additionally, Gleason 4+3 was seen in the left base and Gleason 3+3 in the left mid.  A PSMA PET scan was performed on 03/19/2024 revealing a dominant intraprostatic lesion on the  left, corresponding to the MRI ROI but was without evidence of metastatic disease.  The patient reviewed the biopsy and imaging results with his urologist and he has kindly been referred today for discussion of potential radiation treatment options. He is accompanied by his daughter, Zachary [name], for today's visit.   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   LIMA to LAD, SVG to Diag, SVG to OM1, SVG to RCA, EVH via right thigh and leg      PAST SURGICAL HISTORY: Past Surgical History:  Procedure Laterality Date   CHONDROPLASTY  06/25/2014   Procedure: CHONDROPLASTY;  Surgeon: Maude Herald, Zachary [name];  Location: [facility];  Service: Orthopedics;;   CORONARY ARTERY BYPASS GRAFT N/A 08/27/2018   Procedure: CORONARY ARTERY BYPASS GRAFTING (CABG) x four,  using left internal mammary artery and right leg greater saphenous vein harvested endoscopically;  Surgeon: Dusty Sudie DEL, Zachary [name];  Location: [facility] OR;  Service: Open Heart Surgery;  Laterality: N/A;   CORONARY ARTERY BYPASS GRAFT N/A 08/27/2018   Procedure: EXPLORATION POST-OP HEART FOR BLEEDING;  Surgeon: Dusty Sudie DEL, Zachary [name];  Location: [facility] OR;  Service: Open Heart  Surgery;  Laterality: N/A;   Growth removed from nose     age 50 yrs.   KNEE ARTHROSCOPY WITH MEDIAL MENISECTOMY Left 06/25/2014   Procedure: LEFT ARTHROSCOPY KNEE,partial medial menisectomy;  Surgeon: Maude Herald, Zachary [name];  Location: [facility];  Service: Orthopedics;  Laterality: Left;   LEFT HEART CATH AND CORONARY ANGIOGRAPHY N/A 08/26/2018   Procedure: LEFT HEART CATH AND CORONARY ANGIOGRAPHY;  Surgeon: Burnard Debby LABOR, Zachary [name];  Location: [facility];  Service: Cardiovascular;  Laterality: N/A;   Lower back surgery     Pinched nerve -  2010   PROSTATE BIOPSY     TEE WITHOUT CARDIOVERSION N/A 08/27/2018   Procedure: TRANSESOPHAGEAL ECHOCARDIOGRAM (TEE);  Surgeon: Dusty Sudie DEL, Zachary [name];  Location: [facility] OR;  Service: Open Heart Surgery;  Laterality: N/A;   TOTAL KNEE ARTHROPLASTY Right 12/17/2023   Procedure: RIGHT TOTAL KNEE ARTHROPLASTY;  Surgeon: Herald Maude, Zachary [name];  Location: [facility]S;  Service: Orthopedics;  Laterality: Right;   Upper back surgery     Plate and pins    FAMILY HISTORY:  Family History  Problem Relation Age of Onset   Heart attack Father 47    SOCIAL HISTORY:  Social History   Socioeconomic History   Marital status: Married    Spouse name: Not on file   Number of children: Not on file   Years of education: Not on file   Highest education level: Not on file  Occupational History   Not on file  Tobacco Use   Smoking status: Former    Types: Cigarettes   Smokeless tobacco: Never   Tobacco comments:    Quit smoking 50 years ago  Vaping Use   Vaping status: Never Used  Substance and Sexual Activity   Alcohol use: No   Drug use: No   Sexual activity: Not Currently  Other Topics Concern   Not on file  Social History Narrative   Not on file   Social Drivers of Health   Tobacco Use: Medium Risk (03/25/2024)   Patient History    Smoking Tobacco Use: Former    Smokeless Tobacco Use: Never    Passive Exposure: Not on Actuary Strain: Not on file  Food Insecurity: No Food Insecurity (03/25/2024)   Epic    Worried About Programme Researcher, Broadcasting/film/video in the Last Year: Never true    Ran Out of Food in the Last Year: Never true  Transportation Needs: No Transportation Needs (03/25/2024)   Epic    Lack of Transportation (Medical): No    Lack of Transportation (Non-Medical): No  Physical Activity: Not on file  Stress: Not on file  Social Connections: Not on file  Intimate Partner Violence: Not At Risk (03/25/2024)   Epic    Fear of Current or Ex-Partner: No    Emotionally Abused: No     Physically Abused: No    Sexually Abused: No  Depression (PHQ2-9): Low Risk (03/25/2024)   Depression (PHQ2-9)    PHQ-2 Score: 0  Alcohol Screen: Low Risk (03/25/2024)   Alcohol Screen    Last Alcohol Screening Score (AUDIT): 0  Housing: Low Risk (03/25/2024)   Epic    Unable to Pay for Housing in the Last Year: No    Number of Times Moved in the Last Year: 0    Homeless in the Last Year: No  Utilities: Not At Risk (03/25/2024)   Epic    Threatened with loss of utilities: No  Health Literacy: Not  on file    ALLERGIES: Aspirin , Codeine, Metoprolol , and Thiazide-type diuretics  MEDICATIONS:  Current Outpatient Medications  Medication Sig Dispense Refill   acetaminophen  (TYLENOL ) 500 MG tablet Take 500 mg by mouth every 6 (six) hours as needed for headache (pain).      amLODipine  (NORVASC ) 10 MG tablet Take 10 mg by mouth daily.     aspirin  EC 81 MG tablet Take 1 tablet (81 mg total) by mouth 2 (two) times daily between meals. Twice a day for 2 weeks then back to once a day after that for DVT prevention. 45 tablet 0   atorvastatin  (LIPITOR ) 40 MG tablet Take 1 tablet (40 mg total) by mouth daily. SCHEDULE OFFICE VISIT FOR FUTURE REFILLS (Patient taking differently: Take 40 mg by mouth at bedtime.) 30 tablet 0   chlorthalidone  (HYGROTON ) 25 MG tablet Take 25 mg by mouth daily.     hydrOXYzine (ATARAX) 25 MG tablet Take 25 mg by mouth 3 (three) times daily as needed for itching.     losartan  (COZAAR ) 100 MG tablet Take 100 mg by mouth daily.     Multiple Vitamin (MULTIVITAMIN WITH MINERALS) TABS tablet Take 1 tablet by mouth daily.     No current facility-administered medications for this encounter.    REVIEW OF SYSTEMS:  On review of systems, the patient reports that he is doing well overall. He denies any chest pain, shortness of breath, cough, fevers, chills, night sweats, unintended weight changes. He denies any bowel disturbances, and denies abdominal pain, nausea or vomiting. He  denies any new musculoskeletal or joint aches or pains. His IPSS was 6, indicating mild urinary symptoms. His SHIM was 1, indicating he has severe erectile dysfunction. A complete review of systems is obtained and is otherwise negative.    PHYSICAL EXAM:  Wt Readings from Last 3 Encounters:  03/25/24 122 lb 9.6 oz (55.6 kg)  12/17/23 130 lb (59 kg)  12/13/23 130 lb (59 kg)   Temp Readings from Last 3 Encounters:  03/25/24 97.8 F (36.6 C)  12/17/23 97.6 F (36.4 C)  12/13/23 (!) 97.5 F (36.4 C) (Oral)   BP Readings from Last 3 Encounters:  03/25/24 (!) 131/56  12/17/23 (!) 141/87  12/13/23 129/61   Pulse Readings from Last 3 Encounters:  03/25/24 (!) 109  12/17/23 66  12/13/23 (!) 52   Pain Assessment Pain Score: 0-No pain/10  In general this is a well appearing Caucasian male in no acute distress. He's alert and oriented x4 and appropriate throughout the examination. Cardiopulmonary assessment is negative for acute distress, and he exhibits normal effort.    KPS = 100  100 - Normal; no complaints; no evidence of disease. 90   - Able to carry on normal activity; minor signs or symptoms of disease. 80   - Normal activity with effort; some signs or symptoms of disease. 87   - Cares for self; unable to carry on normal activity or to do active work. 60   - Requires occasional assistance, but is able to care for most of his personal needs. 50   - Requires considerable assistance and frequent medical care. 40   - Disabled; requires special care and assistance. 30   - Severely disabled; hospital admission is indicated although death not imminent. 20   - Very sick; hospital admission necessary; active supportive treatment necessary. 10   - Moribund; fatal processes progressing rapidly. 0     - Dead  Karnofsky DA, Abelmann WH, Craver LS and  Burchenal JH [phone]) The use of the nitrogen mustards in the palliative treatment of carcinoma: with particular reference to bronchogenic  carcinoma Cancer 1 634-56  LABORATORY DATA:  Lab Results  Component Value Date   WBC 6.7 12/13/2023   HGB 13.7 12/13/2023   HCT 41.1 12/13/2023   MCV 95.4 12/13/2023   PLT 259 12/13/2023   Lab Results  Component Value Date   NA 140 12/13/2023   K 3.8 12/13/2023   CL 103 12/13/2023   CO2 28 12/13/2023   Lab Results  Component Value Date   ALT 29 12/16/2018   AST 31 12/16/2018   ALKPHOS 77 12/16/2018   BILITOT 0.6 12/16/2018     RADIOGRAPHY: NM PET (PSMA) SKULL TO MID THIGH Result Date: 03/24/2024 EXAM: PROSTATE PET SKULL BASE TO MID THIGHS 03/19/2024 05:09:04 PM TECHNIQUE: RADIOPHARMACEUTICAL: 8.43 mCi F-18 flotufolastaf (Posluma ) injected intravenously. PET imaging was obtained from skull vertex to mid thighs. Computed tomography was used for attenuation correction and localization. Fusion imaging was obtained. COMPARISON: Lesional comparison MRI. CLINICAL HISTORY: HJ PSMA FOR PROSTATE CANCER PSA = 10.0 ng/ml 11/05/2023 Pt states he had prostate bx about 1 mo ago WOV FINDINGS: PROSTATE AND PROSTATE BED: Focal intense PSMA activity in the posterior left aspect of the prostate gland with SUV max of 7.0 corresponds to lesional comparison MRI. LYMPH NODES: No PSMA avid abdominal or pelvic lymph nodes. No PSMA avid mediastinal nodes. BONES: No skeletal metastases. OTHER FINDINGS: Physiologic activity within the salivary glands, liver, spleen, kidneys, bowel, and urinary bladder. No suspicious pulmonary nodules. Midline sternotomy and CABG. Perisclerotic calcification of the abdominal aorta. Anterior fusion of the cervical spine. MR degenerative endplate change in the lumbar spine. IMPRESSION: 1. 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 2. No PSMA avid abdominal, pelvic, or mediastinal lymph nodes. 3. No suspicious pulmonary nodules or skeletal metastases. Electronically signed by: Norleen Boxer Zachary [name] 03/24/2024 03:34 PM  EST RP Workstation: [id number]      IMPRESSION/PLAN: 1. 84 y.o. 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  Today, we dicussed the patient's workup and outlined the nature of prostate cancer in this setting. The patient's T stage, Gleason's score, and PSA put him into the high risk group. Accordingly, he is eligible for a variety of potential treatment options including ADT concurrent with either 8 weeks of external radiation or 5 weeks of external radiation with an upfront brachytherapy boost. Given his advanced age, he is not felt to be an ideal candidate for prostatectomy.  We discussed the available radiation techniques, and focused on the details and logistics of delivery. We discussed and outlined the risks, benefits, short and long-term effects associated with radiotherapy and compared and contrasted these with prostatectomy. We discussed the role of SpaceOAR gel in reducing the rectal toxicity associated with radiotherapy. We also detailed the role of ADT in the treatment of high risk prostate cancer and outlined the associated side effects that could be expected with this therapy.  He and his daughter were encouraged to ask questions that were answered to their stated satisfaction.  At the conclusion of our conversation, the patient is interested in moving forward with brachytherapy boost and use of rectal spacer gel followed by a 5 week course of daily radiotherapy concurrent with ADT. We will share our discussion with Dr. Cam and coordinate for a follow up visit in the urology office, first  available, to start ADT now, preferably with Orgovyx, in light of his cardiac history. We will also coordinate for the seed boost procedure approximately 2 months from the start of ADT.  The patient met briefly with Zachary [name] in our office who will be working closely with him to coordinate OR scheduling and pre and post procedure appointments.  We  will contact the pharmaceutical rep to ensure that rectal spacer gel is available at the time of procedure.  We will see him back 2-3 weeks after his seed boost procedure and will proceed with CT SIM prostate for treatment planning in anticipation of beginning the 5 week course of daily external beam radiation 3-4 weeks after the seed boost procedure. He appears to have a good understanding of his disease and our treatment recommendations which are of curative intent and he is in agreement with the stated plan. We enjoyed meeting him and his daughter, Zachary [name], and look forward to continuing to participate in his care.  We personally spent 70 minutes in this encounter including chart review, reviewing radiological studies, meeting face-to-face with the patient, entering orders and completing documentation.    Zachary MICAEL Rusk, Zachary [name]    Zachary Barge, Zachary [name]  [facility]  Radiation Oncology Direct Dial: [phone]  Fax: [phone] [email or web address]  Skype  LinkedIn  This document serves as a record of services personally performed by Zachary Barge, Zachary [name] and Zachary Rusk, Zachary [name]. It was created on their behalf by Damien Blanks, a trained medical scribe. The creation of this record is based on the scribe's personal observations and the provider's statements to them. This document has been checked and approved by the attending provider. "

## 2024-03-25 NOTE — Telephone Encounter (Signed)
 CALLED PATIENT TO INFORM OF ADT APPT. FOR 04/21/24- ARRIVAL TIME- 8:45 AM @ DR. HERRICK'S OFFICE, SPOKE WITH PATIENT AND HE IS AWARE OF THIS APPT.

## 2024-03-30 NOTE — Progress Notes (Signed)
 Patient is now scheduled to meet with Dr. Cam on 2/3.  RN will follow to confirm start of ADT.

## 2024-04-01 ENCOUNTER — Telehealth: Payer: Self-pay | Admitting: *Deleted

## 2024-04-01 NOTE — Telephone Encounter (Signed)
 Called patient to ask if [facility] has gotten him an earlier appt. for his ADT, and he tells me that he will go on 04/06/24 @ 8 am @ Dr. Hosea office

## 2024-04-08 ENCOUNTER — Other Ambulatory Visit: Payer: Self-pay | Admitting: Urology

## 2024-04-08 ENCOUNTER — Telehealth: Payer: Self-pay | Admitting: Cardiology

## 2024-04-08 NOTE — Telephone Encounter (Signed)
" ° °  Pre-operative Risk Assessment    Patient Name: Zachary [name]  DOB: [birth year] MRN: [id number]      Request for Surgical Clearance    Procedure:  Rqadioactive Seed Implants & Barrigel   Date of Surgery:  Clearance 06/10/24                                 Surgeon:  Dr. Morene Riches  Surgeon's Group or Practice Name:  [facility]  Phone number:  [phone] Fax number:  [phone]   Type of Clearance Requested:   - Medical  - Pharmacy:  Hold Aspirin  Wants to stop 5 days before    Type of Anesthesia:  General    Additional requests/questions:  Please fax a copy of Medical Clearance  to the surgeon's office.  Signed, Chantal CHRISTELLA Penton   04/08/2024, 4:15 PM   "

## 2024-04-09 ENCOUNTER — Telehealth: Payer: Self-pay | Admitting: *Deleted

## 2024-04-09 NOTE — Telephone Encounter (Signed)
" ° °  Name: Zachary [name]  DOB: 1940-10-22  MRN: [id number]  Primary Cardiologist: Dorn Lesches, MD  Chart reviewed as part of pre-operative protocol coverage. Because of Zachary [name]'s past medical history and time since last visit, he will require a follow-up in-office visit in order to better assess preoperative cardiovascular risk.  Pre-op covering staff: - Please schedule appointment and call patient to inform them. If patient already had an upcoming appointment within acceptable timeframe, please add pre-op clearance to the appointment notes so provider is aware. - Please contact requesting surgeon's office via preferred method (i.e, phone, fax) to inform them of need for appointment prior to surgery.  Per medical record he is not on Plavix  but he is taking aspirin  81 mg daily.  This is not prescribed by us .  Would recommend holding parameters come from prescribing provider.  Zachary LOISE Fabry, PA-C  04/09/2024, 8:09 AM   "

## 2024-04-09 NOTE — Telephone Encounter (Signed)
 Pt has been scheduled in office appt with Dr. Court, per preop APP Orren Fabry, PAC. I will update all parties involved pt has appt 05/06/24 Dr. Court for preop clearance.  Pt has been given address to [employer].

## 2024-04-09 NOTE — Telephone Encounter (Signed)
 CALLED PATIENT TO INFORM OF PRE-SEED APPTS. AND IMPLANT DATE, SPOKE WITH PATIENT AND HE IS AWARE OF THESE APPTS.

## 2024-05-06 ENCOUNTER — Ambulatory Visit: Admitting: Cardiovascular Disease

## 2024-05-21 ENCOUNTER — Ambulatory Visit: Admitting: Radiation Oncology

## 2024-05-21 ENCOUNTER — Ambulatory Visit: Admitting: Urology

## 2024-06-10 ENCOUNTER — Ambulatory Visit (HOSPITAL_COMMUNITY): Admit: 2024-06-10 | Admitting: Urology
